# Patient Record
Sex: Female | Born: 1965 | Race: Black or African American | Hispanic: No | Smoking: Former smoker
Health system: Southern US, Community
[De-identification: ages and names within clinical notes are randomized; demographics above are authoritative.]

## PROBLEM LIST (undated history)

## (undated) DIAGNOSIS — F32A Depression, unspecified: Secondary | ICD-10-CM

## (undated) DIAGNOSIS — F329 Major depressive disorder, single episode, unspecified: Secondary | ICD-10-CM

## (undated) DIAGNOSIS — E785 Hyperlipidemia, unspecified: Secondary | ICD-10-CM

## (undated) DIAGNOSIS — E119 Type 2 diabetes mellitus without complications: Secondary | ICD-10-CM

## (undated) DIAGNOSIS — I10 Essential (primary) hypertension: Secondary | ICD-10-CM

## (undated) DIAGNOSIS — I639 Cerebral infarction, unspecified: Secondary | ICD-10-CM

## (undated) HISTORY — PX: TUBAL LIGATION: SHX77

## (undated) HISTORY — DX: Depression, unspecified: F32.A

## (undated) HISTORY — DX: Hyperlipidemia, unspecified: E78.5

## (undated) HISTORY — DX: Essential (primary) hypertension: I10

## (undated) HISTORY — DX: Type 2 diabetes mellitus without complications: E11.9

---
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
## 1998-06-20 ENCOUNTER — Encounter: Admission: RE | Admit: 1998-06-20 | Discharge: 1998-09-18 | Payer: Self-pay

## 1999-04-17 ENCOUNTER — Encounter: Admission: RE | Admit: 1999-04-17 | Discharge: 1999-04-17 | Payer: Self-pay | Admitting: Orthopedic Surgery

## 2000-02-21 ENCOUNTER — Other Ambulatory Visit: Admission: RE | Admit: 2000-02-21 | Discharge: 2000-02-21 | Payer: Self-pay | Admitting: Family Medicine

## 2001-07-08 ENCOUNTER — Encounter: Admission: RE | Admit: 2001-07-08 | Discharge: 2001-07-14 | Payer: Self-pay | Admitting: Family Medicine

## 2001-09-15 ENCOUNTER — Other Ambulatory Visit: Admission: RE | Admit: 2001-09-15 | Discharge: 2001-09-15 | Payer: Self-pay | Admitting: Family Medicine

## 2004-12-04 ENCOUNTER — Ambulatory Visit: Payer: Self-pay | Admitting: Family Medicine

## 2005-04-18 ENCOUNTER — Ambulatory Visit: Payer: Self-pay | Admitting: Family Medicine

## 2005-12-09 ENCOUNTER — Ambulatory Visit: Payer: Self-pay | Admitting: Family Medicine

## 2007-01-08 ENCOUNTER — Ambulatory Visit: Payer: Self-pay | Admitting: Family Medicine

## 2012-06-12 ENCOUNTER — Encounter (HOSPITAL_COMMUNITY): Payer: Self-pay | Admitting: *Deleted

## 2012-06-12 ENCOUNTER — Emergency Department (HOSPITAL_COMMUNITY): Payer: BC Managed Care – PPO

## 2012-06-12 ENCOUNTER — Emergency Department (HOSPITAL_COMMUNITY)
Admission: EM | Admit: 2012-06-12 | Discharge: 2012-06-12 | Disposition: A | Discharge status: Home / Self Care | Payer: BC Managed Care – PPO | Attending: Emergency Medicine | Admitting: Emergency Medicine

## 2012-06-12 DIAGNOSIS — F17200 Nicotine dependence, unspecified, uncomplicated: Secondary | ICD-10-CM | POA: Insufficient documentation

## 2012-06-12 DIAGNOSIS — M25519 Pain in unspecified shoulder: Secondary | ICD-10-CM | POA: Insufficient documentation

## 2012-06-12 DIAGNOSIS — R079 Chest pain, unspecified: Secondary | ICD-10-CM

## 2012-06-12 LAB — CBC WITH DIFFERENTIAL/PLATELET
Basophils Relative: 0 % (ref 0–1)
HCT: 36.6 % (ref 36.0–46.0)
Hemoglobin: 12.4 g/dL (ref 12.0–15.0)
MCHC: 33.9 g/dL (ref 30.0–36.0)
MCV: 88 fL (ref 78.0–100.0)
Monocytes Absolute: 0.4 10*3/uL (ref 0.1–1.0)
Monocytes Relative: 8 % (ref 3–12)
Neutro Abs: 2.1 10*3/uL (ref 1.7–7.7)

## 2012-06-12 LAB — BASIC METABOLIC PANEL
BUN: 13 mg/dL (ref 6–23)
CO2: 26 mEq/L (ref 19–32)
Chloride: 105 mEq/L (ref 96–112)
Creatinine, Ser: 0.7 mg/dL (ref 0.50–1.10)

## 2012-06-12 MED ORDER — NAPROXEN 500 MG PO TABS: 500.0000 mg | ORAL_TABLET | Freq: Two times a day (BID) | ORAL | Status: AC

## 2012-06-12 MED ORDER — HYDROCODONE-ACETAMINOPHEN 5-325 MG PO TABS: 1.0000 | ORAL_TABLET | Freq: Four times a day (QID) | ORAL | Status: AC | PRN

## 2012-06-12 MED ORDER — ASPIRIN 81 MG PO CHEW
324.0000 mg | CHEWABLE_TABLET | Freq: Once | ORAL | Status: AC
  Administered 2012-06-12: 324 mg via ORAL
  Filled 2012-06-12: qty 4

## 2012-06-12 NOTE — ED Provider Notes (Addendum)
History     CSN: [id number]  Arrival date & time 06/12/12  1655   First MD Initiated Contact with Patient 06/12/12 1722      Chief Complaint  Patient presents with  . Shoulder Pain    (Consider location/radiation/quality/duration/timing/severity/associated sxs/prior treatment) Patient is a 46 y.o. female presenting with chest pain.  Chest Pain The chest pain began 2 days ago. Duration of episode(s) is 2 minutes. Chest pain occurs intermittently. The chest pain is unchanged. At its most intense, the pain is at 7/10. The pain is currently at 0/10. The severity of the pain is moderate. The quality of the pain is described as aching and sharp. The pain radiates to the left shoulder. Chest pain is worsened by certain positions. Pertinent negatives for primary symptoms include no fever, no syncope, no shortness of breath, no cough, no wheezing, no palpitations, no abdominal pain, no nausea, no vomiting and no dizziness.  Pertinent negatives for past medical history include no CAD, no COPD, no CHF, no diabetes, no MI and no PE.     History reviewed. No pertinent past medical history.  Past Surgical History  Procedure Date  . Tubal ligation     History reviewed. No pertinent family history.  History  Substance Use Topics  . Smoking status: Current Everyday Smoker  . Smokeless tobacco: Not on file  . Alcohol Use: No    OB History    Grav Para Term Preterm Abortions TAB SAB Ect Mult Living                  Review of Systems  Constitutional: Negative for fever.  HENT: Negative for neck pain.   Eyes: Negative for visual disturbance.  Respiratory: Negative for cough, shortness of breath and wheezing.   Cardiovascular: Positive for chest pain. Negative for palpitations and syncope.  Gastrointestinal: Negative for nausea, vomiting and abdominal pain.  Genitourinary: Negative for dysuria.  Musculoskeletal: Negative for back pain.  Skin: Negative for rash.  Neurological: Negative  for dizziness and headaches.    Allergies  Penicillins  Home Medications   Current Outpatient Rx  Name Route Sig Dispense Refill  . HYDROCODONE-ACETAMINOPHEN 5-325 MG PO TABS Oral Take 1-2 tablets by mouth every 6 (six) hours as needed for pain. 10 tablet 0  . NAPROXEN 500 MG PO TABS Oral Take 1 tablet (500 mg total) by mouth 2 (two) times daily. 14 tablet 0    BP 151/75  Pulse 86  Temp 98.5 F (36.9 C) (Oral)  Resp 18  Ht 5\' 4"  (1.626 m)  Wt 185 lb (83.915 kg)  BMI 31.76 kg/m2  SpO2 98%  LMP 04/23/2012  Physical Exam  Nursing note and vitals reviewed. Constitutional: She is oriented to person, place, and time. She appears well-developed and well-nourished.  HENT:  Head: Normocephalic and atraumatic.  Mouth/Throat: Oropharynx is clear and moist.  Eyes: Conjunctivae and EOM are normal. Pupils are equal, round, and reactive to light.  Neck: Normal range of motion. Neck supple.  Cardiovascular: Normal rate, regular rhythm, normal heart sounds and intact distal pulses.   No murmur heard. Pulmonary/Chest: Effort normal and breath sounds normal. She has no wheezes. She has no rales. She exhibits no tenderness.  Abdominal: Soft. Bowel sounds are normal. There is no tenderness.  Musculoskeletal: Normal range of motion. She exhibits no edema and no tenderness.  Neurological: She is alert and oriented to person, place, and time. No cranial nerve deficit. She exhibits normal muscle tone. Coordination normal.  Skin: Skin is warm. No rash noted.    ED Course  Procedures (including critical care time)  Labs Reviewed  CBC WITH DIFFERENTIAL - Abnormal; Notable for the following:    Neutrophils Relative 41 (*)     Lymphocytes Relative 48 (*)     All other components within normal limits  BASIC METABOLIC PANEL - Abnormal; Notable for the following:    Glucose, Bld 117 (*)     All other components within normal limits  TROPONIN I   Dg Chest 2 View  06/12/2012  *RADIOLOGY REPORT*   Clinical Data: Upper posterior chest and shoulder pain.  CHEST - 2 VIEW  Comparison: None.  Findings: Cardiomediastinal silhouette unremarkable.   Lungs clear. Bronchovascular markings normal.  Pulmonary vascularity normal.  No pneumothorax.  No pleural effusions.  Mild degenerative changes involving the lower thoracic spine.  IMPRESSION: No acute cardiopulmonary disease.  Original Report Authenticated By: Arnell Sieving, M.D.    Date: 06/12/2012  Rate: 63  Rhythm: normal sinus rhythm  QRS Axis: normal  Intervals: normal  ST/T Wave abnormalities: normal  Conduction Disutrbances:none  Narrative Interpretation:   Old EKG Reviewed: none available  Results for orders placed during the hospital encounter of 06/12/12  CBC WITH DIFFERENTIAL      Component Value Range   WBC 5.0  4.0 - 10.5 K/uL   RBC 4.16  3.87 - 5.11 MIL/uL   Hemoglobin 12.4  12.0 - 15.0 g/dL   HCT 16.1  09.6 - 04.5 %   MCV 88.0  78.0 - 100.0 fL   MCH 29.8  26.0 - 34.0 pg   MCHC 33.9  30.0 - 36.0 g/dL   RDW 40.9  81.1 - 91.4 %   Platelets 295  150 - 400 K/uL   Neutrophils Relative 41 (*) 43 - 77 %   Neutro Abs 2.1  1.7 - 7.7 K/uL   Lymphocytes Relative 48 (*) 12 - 46 %   Lymphs Abs 2.4  0.7 - 4.0 K/uL   Monocytes Relative 8  3 - 12 %   Monocytes Absolute 0.4  0.1 - 1.0 K/uL   Eosinophils Relative 2  0 - 5 %   Eosinophils Absolute 0.1  0.0 - 0.7 K/uL   Basophils Relative 0  0 - 1 %   Basophils Absolute 0.0  0.0 - 0.1 K/uL  BASIC METABOLIC PANEL      Component Value Range   Sodium 140  135 - 145 mEq/L   Potassium 3.7  3.5 - 5.1 mEq/L   Chloride 105  96 - 112 mEq/L   CO2 26  19 - 32 mEq/L   Glucose, Bld 117 (*) 70 - 99 mg/dL   BUN 13  6 - 23 mg/dL   Creatinine, Ser 7.82  0.50 - 1.10 mg/dL   Calcium 9.1  8.4 - 95.6 mg/dL   GFR calc non Af Amer >90  >90 mL/min   GFR calc Af Amer >90  >90 mL/min  TROPONIN I      Component Value Range   Troponin I <0.30  <0.30 ng/mL     1. Chest pain       MDM  Cardiac  workup without any cystic findings EKG without acute changes troponin is negative patient's had chest pain intermittently for 2 days not consistent with acute cardiac event or unstable angina. Electrolytes without ascitic abnormalities no anemia no leukocytosis chest x-ray negative no pneumonia no pneumothorax. Pain is been intermittent also involves the left shoulder  is somewhat associated with movement but wasn't reproducible here in the emergency part may be musculoskeletal in nature could even be related to her left shoulder. Patient is followed by [facility] patient can followup with them in the next few days we'll send home with the anti-inflammatories and pain medicine.        Shelda Jakes, MD 06/12/12 2006  Shelda Jakes, MD 06/12/12 2011

## 2012-06-12 NOTE — ED Notes (Addendum)
Chest pain, starts at lt  shoulder and goes to lt ant upper chest. No nausea, no sob . Tender to palp lt upper ant chest, Pain occurs when lifting anything heavy with lt arm.

## 2013-11-03 ENCOUNTER — Emergency Department (HOSPITAL_COMMUNITY)
Admission: EM | Admit: 2013-11-03 | Discharge: 2013-11-03 | Disposition: A | Discharge status: Home / Self Care | Payer: BC Managed Care – PPO | Attending: Emergency Medicine | Admitting: Emergency Medicine

## 2013-11-03 ENCOUNTER — Encounter (HOSPITAL_COMMUNITY): Payer: Self-pay | Admitting: Emergency Medicine

## 2013-11-03 ENCOUNTER — Emergency Department (HOSPITAL_COMMUNITY): Payer: BC Managed Care – PPO

## 2013-11-03 DIAGNOSIS — R0789 Other chest pain: Secondary | ICD-10-CM

## 2013-11-03 DIAGNOSIS — Z880 Allergy status to penicillin: Secondary | ICD-10-CM | POA: Insufficient documentation

## 2013-11-03 DIAGNOSIS — R071 Chest pain on breathing: Secondary | ICD-10-CM | POA: Insufficient documentation

## 2013-11-03 DIAGNOSIS — M25519 Pain in unspecified shoulder: Secondary | ICD-10-CM | POA: Insufficient documentation

## 2013-11-03 DIAGNOSIS — Z791 Long term (current) use of non-steroidal anti-inflammatories (NSAID): Secondary | ICD-10-CM | POA: Insufficient documentation

## 2013-11-03 DIAGNOSIS — M25511 Pain in right shoulder: Secondary | ICD-10-CM

## 2013-11-03 DIAGNOSIS — F17200 Nicotine dependence, unspecified, uncomplicated: Secondary | ICD-10-CM | POA: Insufficient documentation

## 2013-11-03 LAB — CBC WITH DIFFERENTIAL/PLATELET
Eosinophils Absolute: 0.2 10*3/uL (ref 0.0–0.7)
Eosinophils Relative: 2 % (ref 0–5)
Hemoglobin: 13.3 g/dL (ref 12.0–15.0)
Lymphs Abs: 2.5 10*3/uL (ref 0.7–4.0)
MCH: 30.2 pg (ref 26.0–34.0)
MCV: 88 fL (ref 78.0–100.0)
Monocytes Relative: 5 % (ref 3–12)
RBC: 4.41 MIL/uL (ref 3.87–5.11)

## 2013-11-03 LAB — BASIC METABOLIC PANEL
BUN: 7 mg/dL (ref 6–23)
Calcium: 8.9 mg/dL (ref 8.4–10.5)
GFR calc Af Amer: >90 mL/min (ref >90)
Glucose, Bld: 119 mg/dL — ABNORMAL HIGH (ref 70–99)

## 2013-11-03 MED ORDER — METHOCARBAMOL 500 MG PO TABS: 500.0000 mg | ORAL_TABLET | Freq: Two times a day (BID) | ORAL | Status: DC | PRN

## 2013-11-03 MED ORDER — NAPROXEN 500 MG PO TABS: 500.0000 mg | ORAL_TABLET | Freq: Two times a day (BID) | ORAL | Status: DC

## 2013-11-03 MED ORDER — KETOROLAC TROMETHAMINE 60 MG/2ML IM SOLN: 60.0000 mg | Freq: Once | INTRAMUSCULAR | Status: DC

## 2013-11-03 MED ORDER — KETOROLAC TROMETHAMINE 30 MG/ML IJ SOLN
30.0000 mg | Freq: Once | INTRAMUSCULAR | Status: AC
  Administered 2013-11-03: 30 mg via INTRAVENOUS
  Filled 2013-11-03: qty 1

## 2013-11-03 NOTE — ED Notes (Signed)
MD at bedside. 

## 2013-11-03 NOTE — ED Provider Notes (Signed)
CSN: [id number]     Arrival date & time 11/03/13  0114 History   First MD Initiated Contact with Patient 11/03/13 0141     Chief Complaint  Patient presents with  . Chest Pain   (Consider location/radiation/quality/duration/timing/severity/associated sxs/prior Treatment) HPI Comments: 47 year old female who has a history of tobacco use, no other acute medical problems who takes no daily medications and presents with a complaint of right shoulder pain and chest pain. The patient states that the right shoulder pain started several days ago, it is in the posterior shoulder, is present most of the time and is worse with range of motion of the shoulder. She works lifting in a bakery setting and states that lifting and using her right arm causes increased pain in the shoulder. This evening while she was at work she noticed pain in her chest which is under the left breast, worse with leaning over, worse with picking things up. This pain is also constant since starting 2 hours ago. It is sometimes worse with deep breathing, it did not appear to be exertional or positional with regards to supine or prone positioning. She denies cough fever or swelling of the legs and has never had a blood clot.  Patient is a 47 y.o. female presenting with chest pain. The history is provided by the patient.  Chest Pain   History reviewed. No pertinent past medical history. Past Surgical History  Procedure Laterality Date  . Tubal ligation     History reviewed. No pertinent family history. History  Substance Use Topics  . Smoking status: Current Every Day Smoker  . Smokeless tobacco: Not on file  . Alcohol Use: Yes     Comment: rarely   OB History   Grav Para Term Preterm Abortions TAB SAB Ect Mult Living                 Review of Systems  Cardiovascular: Positive for chest pain.  All other systems reviewed and are negative.    Allergies  Penicillins  Home Medications   Current Outpatient Rx  Name   Route  Sig  Dispense  Refill  . methocarbamol (ROBAXIN) 500 MG tablet   Oral   Take 1 tablet (500 mg total) by mouth 2 (two) times daily as needed for muscle spasms.   20 tablet   0   . naproxen (NAPROSYN) 500 MG tablet   Oral   Take 1 tablet (500 mg total) by mouth 2 (two) times daily with a meal.   30 tablet   0    BP 174/71  Pulse 59  Temp(Src) 98.4 F (36.9 C) (Oral)  Resp 17  Ht 5\' 3"  (1.6 m)  Wt 180 lb (81.647 kg)  BMI 31.89 kg/m2  SpO2 99%  LMP 08/03/2013 Physical Exam  Nursing note and vitals reviewed. Constitutional: She appears well-developed and well-nourished. No distress.  HENT:  Head: Normocephalic and atraumatic.  Mouth/Throat: Oropharynx is clear and moist. No oropharyngeal exudate.  Eyes: Conjunctivae and EOM are normal. Pupils are equal, round, and reactive to light. Right eye exhibits no discharge. Left eye exhibits no discharge. No scleral icterus.  Neck: Normal range of motion. Neck supple. No JVD present. No thyromegaly present.  Cardiovascular: Normal rate, regular rhythm, normal heart sounds and intact distal pulses.  Exam reveals no gallop and no friction rub.   No murmur heard. Pulmonary/Chest: Effort normal and breath sounds normal. No respiratory distress. She has no wheezes. She has no rales. She exhibits tenderness (  Reproducible left-sided inframammary tenderness of the chest wall).  Abdominal: Soft. Bowel sounds are normal. She exhibits no distension and no mass. There is no tenderness.  Musculoskeletal: Normal range of motion. She exhibits tenderness ( Tender to palpation over the right posterior trapezius and shoulder). She exhibits no edema.  Lymphadenopathy:    She has no cervical adenopathy.  Neurological: She is alert. Coordination normal.  Skin: Skin is warm and dry. No rash noted. No erythema.  Psychiatric: She has a normal mood and affect. Her behavior is normal.    ED Course  Procedures (including critical care time) Labs  Review Labs Reviewed  BASIC METABOLIC PANEL - Abnormal; Notable for the following:    Glucose, Bld 119 (*)    All other components within normal limits  TROPONIN I  CBC WITH DIFFERENTIAL   Imaging Review Dg Chest 2 View  11/03/2013   CLINICAL DATA:  Right shoulder pain without injury for several days. Chest pain and shortness of breath for 1 hr tonight.  EXAM: CHEST  2 VIEW  COMPARISON:  06/12/2012  FINDINGS: The heart size and mediastinal contours are within normal limits. Both lungs are clear. The visualized skeletal structures are unremarkable.  IMPRESSION: No active cardiopulmonary disease.   Electronically Signed   By: Burman Nieves M.D.   On: 11/03/2013 02:28    EKG Interpretation    Date/Time:  Wednesday November 03 2013 01:21:18 EST Ventricular Rate:  64 PR Interval:  144 QRS Duration: 78 QT Interval:  446 QTC Calculation: 460 R Axis:   -13 Text Interpretation:  Normal sinus rhythm Minimal voltage criteria for LVH, may be normal variant Cannot rule out Anterior infarct (cited on or before 03-Nov-2013) Abnormal ECG When compared with ECG of 12-Jun-2012 17:28, No significant change was found Confirmed by Ercell Perlman  MD, Hollis Oh ([id number]) on 11/03/2013 1:52:44 AM            MDM   1. Chest wall pain   2. Shoulder pain, right    The patient has what appears to be body wall pain and shoulder pain consistent with musculoskeletal etiology. She does not have a history of heart disease, she does have hypertension on her vital signs but no fever, no tachycardia and an EKG which shows no acute ischemia. We'll obtain a troponin a chest x-ray the patient otherwise appears well, pain medication ordered.  Troponin normal, chest x-ray normal, patient stable for discharge  Meds given in ED:  Medications  ketorolac (TORADOL) 30 MG/ML injection 30 mg (30 mg Intravenous Given 11/03/13 0214)    New Prescriptions   METHOCARBAMOL (ROBAXIN) 500 MG TABLET    Take 1 tablet (500 mg total) by  mouth 2 (two) times daily as needed for muscle spasms.   NAPROXEN (NAPROSYN) 500 MG TABLET    Take 1 tablet (500 mg total) by mouth 2 (two) times daily with a meal.        Vida Roller, MD 11/03/13 [phone]

## 2013-11-03 NOTE — ED Notes (Signed)
Patient complaining of chest pain starting approximately 1 hour ago radiating into right shoulder.

## 2015-04-05 ENCOUNTER — Telehealth: Payer: Self-pay

## 2015-04-05 NOTE — Telephone Encounter (Signed)
[facility] I called and LM for a return call.

## 2015-04-07 ENCOUNTER — Telehealth: Payer: Self-pay

## 2015-04-07 NOTE — Telephone Encounter (Signed)
I called pt and triaged.

## 2015-04-07 NOTE — Telephone Encounter (Signed)
Patient was calling to speak with DS, but nurse was D/C a patient. I told her that nurse would call her back in a few minutes per nurse. Please return call at [phone]

## 2015-04-11 ENCOUNTER — Other Ambulatory Visit: Payer: Self-pay

## 2015-04-11 DIAGNOSIS — Z1211 Encounter for screening for malignant neoplasm of colon: Secondary | ICD-10-CM

## 2015-04-11 MED ORDER — SOD PICOSULFATE-MAG OX-CIT ACD 10-3.5-12 MG-GM-GM PO PACK
1.0000 | PACK | ORAL | Status: DC
Start: 2015-04-11 — End: 2015-05-26

## 2015-04-11 NOTE — Telephone Encounter (Signed)
PREPOPIK-DRINK WATER TO KEEP URINE LIGHT YELLOW.  FULL LIQUIDS WITH BREAKFAST oN day prior to TCS.   Full Liquid Diet A high-calorie, high-protein supplement should be used to meet your nutritional requirements when the full liquid diet is continued for more than 2 or 3 days. If this diet is to be used for an extended period of time (more than 7 days), a multivitamin should be considered.  Breads and Starches  Allowed: None are allowed except crackers WHOLE OR pureed (made into a thick, smooth soup) in soup.   Avoid: Any others.    Potatoes/Pasta/Rice  Allowed: ANY ITEM AS A SOUP OR SMALL PLATE OF MASHED POTATOES.       Vegetables  Allowed: Strained tomato or vegetable juice. Vegetables pureed in soup.   Avoid: Any others.    Fruit  Allowed: Any strained fruit juices and fruit drinks. Include 1 serving of citrus or vitamin C-enriched fruit juice daily.   Avoid: Any others.  Meat and Meat Substitutes  Allowed: Egg  Avoid: Any meat, fish, or fowl. All cheese.  Milk  Allowed: Milk beverages, including milk shakes and instant breakfast mixes. Smooth yogurt.   Avoid: Any others. Avoid dairy products if not tolerated.    Soups and Combination Foods  Allowed: Broth, strained cream soups. Strained, broth-based soups.   Avoid: Any others.    Desserts and Sweets  Allowed: flavored gelatin,plain ice cream, sherbet, smooth pudding, junket, fruit ices, frozen ice pops, pudding pops,, frozen fudge pops, chocolate syrup. Sugar, honey, jelly, syrup.   Avoid: Any others.  Fats and Oils  Allowed: Margarine, butter, cream, sour cream, oils.   Avoid: Any others.  Beverages  Allowed: All.   Avoid: None.  Condiments  Allowed: Iodized salt, pepper, spices, flavorings. Cocoa powder.   Avoid: Any others.    SAMPLE MEAL PLAN Breakfast   cup orange juice.   1 OR 2 EGGS   1 cup  milk.   1 cup beverage (coffee or tea).   Cream or sugar, if desired.     Midmorning Snack  2 SCRAMBLED OR HARD BOILED EGG   Lunch  1 cup cream soup.    cup fruit juice.   1 cup milk.    cup custard.   1 cup beverage (coffee or tea).   Cream or sugar, if desired.    Midafternoon Snack  1 cup milk shake.  Dinner  1 cup cream soup.    cup fruit juice.   1 cup milk.    cup pudding.   1 cup beverage (coffee or tea).   Cream or sugar, if desired.  Evening Snack  1 cup supplement.  To increase calories, add sugar, cream, butter, or margarine if possible. Nutritional supplements will also increase the total calories.

## 2015-04-11 NOTE — Telephone Encounter (Signed)
See separate triage.  

## 2015-04-11 NOTE — Telephone Encounter (Signed)
Gastroenterology Pre-Procedure Review  Request Date: 04/05/2015 Requesting Physician: Karsten Roochele Muse, PA  PATIENT REVIEW QUESTIONS: The patient responded to the following health history questions as indicated:    1. Diabetes Melitis: Borderline 2. Joint replacements in the past 12 months: no 3. Major health problems in the past 3 months: no 4. Has an artificial valve or MVP: no 5. Has a defibrillator: no 6. Has been advised in past to take antibiotics in advance of a procedure like teeth cleaning: no    MEDICATIONS & ALLERGIES:    Patient reports the following regarding taking any blood thinners:   Plavix? no Aspirin? no Coumadin? no  Patient confirms/reports the following medications:  Current Outpatient Prescriptions  Medication Sig Dispense Refill  . methocarbamol (ROBAXIN) 500 MG tablet Take 1 tablet (500 mg total) by mouth 2 (two) times daily as needed for muscle spasms. 20 tablet 0  . naproxen sodium (ANAPROX) 220 MG tablet Take 220 mg by mouth 2 (two) times daily with a meal. Only as needed    . naproxen (NAPROSYN) 500 MG tablet Take 1 tablet (500 mg total) by mouth 2 (two) times daily with a meal. (Patient not taking: Reported on 04/07/2015) 30 tablet 0   No current facility-administered medications for this visit.    Patient confirms/reports the following allergies:  Allergies  Allergen Reactions  . Penicillins Itching and Rash    No orders of the defined types were placed in this encounter.    AUTHORIZATION INFORMATION Primary Insurance:   ID #:   Group #:  Pre-Cert / Auth required:  Pre-Cert / Auth #:   Secondary Insurance:   ID #:  Group #:  Pre-Cert / Auth required:  Pre-Cert / Auth #:   SCHEDULE INFORMATION: Procedure has been scheduled as follows:  Date: 04/21/2015              Time:  11:00 AM Location:  [facility] Short Stay  This Gastroenterology Pre-Precedure Review Form is being routed to the following provider(s): Jonette EvaSandi Fields, MD

## 2015-04-11 NOTE — Telephone Encounter (Signed)
Rx sent to the pharmacy and instructions mailed to pt. ( Also, handwritten on instructions to drink lots of water with this prep).

## 2015-04-17 ENCOUNTER — Telehealth: Payer: Self-pay

## 2015-04-17 NOTE — Telephone Encounter (Signed)
I called [employer] @ [phone] and spoke to [place] A who said a PA is not required for a screening colonoscopy.

## 2015-04-17 NOTE — Telephone Encounter (Signed)
Reference number for this call is: [id number].

## 2015-04-20 ENCOUNTER — Telehealth: Payer: Self-pay

## 2015-04-20 NOTE — Telephone Encounter (Signed)
Endo nurse(Melanie)  called office and states that while doing phone calls for procedures that pt was concerned because her ride has to be at work at 1:00pm tomorrow. Shawna Orleans states that there is no guarantee that pt will be release by the time she needs for her ride.   Called pt and she doesn't want to risk her ride being late for work. Pt wants to be rescheduled at a later date and maybe an earlier time.   Pt will be past her 30 days and the triage nurse will have to reschedule her. Pt is aware she will have to buy another prep.  Routing to [employer]

## 2015-04-21 ENCOUNTER — Ambulatory Visit (HOSPITAL_COMMUNITY)
Admission: RE | Admit: 2015-04-21 | Payer: BLUE CROSS/BLUE SHIELD | Source: Ambulatory Visit | Admitting: Gastroenterology

## 2015-04-21 ENCOUNTER — Encounter (HOSPITAL_COMMUNITY): Admission: RE | Payer: Self-pay | Source: Ambulatory Visit

## 2015-04-21 SURGERY — COLONOSCOPY
Anesthesia: Moderate Sedation

## 2015-04-24 NOTE — Telephone Encounter (Signed)
LMOM for pt to call to reschedule colonoscopy. Letter mailed also.

## 2015-05-02 ENCOUNTER — Encounter (HOSPITAL_COMMUNITY): Payer: Self-pay | Admitting: *Deleted

## 2015-05-02 ENCOUNTER — Emergency Department (HOSPITAL_COMMUNITY)
Admission: EM | Admit: 2015-05-02 | Discharge: 2015-05-03 | Disposition: A | Discharge status: Home / Self Care | Payer: BLUE CROSS/BLUE SHIELD | Attending: Emergency Medicine | Admitting: Emergency Medicine

## 2015-05-02 DIAGNOSIS — Z791 Long term (current) use of non-steroidal anti-inflammatories (NSAID): Secondary | ICD-10-CM | POA: Diagnosis not present

## 2015-05-02 DIAGNOSIS — Z79899 Other long term (current) drug therapy: Secondary | ICD-10-CM | POA: Insufficient documentation

## 2015-05-02 DIAGNOSIS — Z720 Tobacco use: Secondary | ICD-10-CM | POA: Insufficient documentation

## 2015-05-02 DIAGNOSIS — R531 Weakness: Secondary | ICD-10-CM | POA: Insufficient documentation

## 2015-05-02 DIAGNOSIS — Z880 Allergy status to penicillin: Secondary | ICD-10-CM | POA: Insufficient documentation

## 2015-05-02 DIAGNOSIS — R51 Headache: Secondary | ICD-10-CM | POA: Insufficient documentation

## 2015-05-02 DIAGNOSIS — R5383 Other fatigue: Secondary | ICD-10-CM | POA: Diagnosis not present

## 2015-05-02 DIAGNOSIS — H53149 Visual discomfort, unspecified: Secondary | ICD-10-CM | POA: Diagnosis not present

## 2015-05-02 DIAGNOSIS — R519 Headache, unspecified: Secondary | ICD-10-CM

## 2015-05-02 MED ORDER — OXYCODONE-ACETAMINOPHEN 5-325 MG PO TABS
1.0000 | ORAL_TABLET | Freq: Once | ORAL | Status: AC
  Administered 2015-05-03: 1 via ORAL
  Filled 2015-05-02: qty 1

## 2015-05-02 NOTE — ED Notes (Signed)
Pt states she has not had any energy today. Pt also c/o headache and high blood pressure.

## 2015-05-02 NOTE — Telephone Encounter (Signed)
Patient would like for you to call her at [phone] to reschedule her appt.

## 2015-05-02 NOTE — ED Provider Notes (Signed)
CSN: [id number]     Arrival date & time 05/02/15  2159 History   This chart was scribed for Cindy RazorStephen Raevin Wierenga, MD by Merlene LaughterAdem Cosgel, ED Scribe. This patient was seen in room APA01/APA01 and the patient's care was started at 11:18 PM.    Chief Complaint  Patient presents with  . Headache    The history is provided by the patient. No language interpreter was used.    HPI Comments: Lawerance SabalFrances Spisak is a 49 y.o. female who presents to the Emergency Department complaining of intermittent, moderate, headache to the front and back, onset 2 days ago.  Patient complains of associated loss of energy this morning and mild photophobia. Patient states she also has trouble sleeping. Patient took Aleve for the headache with mild relief. Patient also feels her blood pressure is elevated, however, she denies history of HTN. Patient denies associated fever, chills, numbness, tingling, hematochezia, melena. Patient denies history of anemia.   History reviewed. No pertinent past medical history. Past Surgical History  Procedure Laterality Date  . Tubal ligation     History reviewed. No pertinent family history. History  Substance Use Topics  . Smoking status: Current Every Day Smoker  . Smokeless tobacco: Not on file  . Alcohol Use: Yes     Comment: rarely   OB History    No data available     Review of Systems  Constitutional: Negative for fever and chills.  Gastrointestinal: Negative for blood in stool.  Neurological: Positive for weakness and headaches. Negative for numbness.       Photophobia      Allergies  Penicillins  Home Medications   Prior to Admission medications   Medication Sig Start Date End Date Taking? Authorizing Provider  methocarbamol (ROBAXIN) 500 MG tablet Take 1 tablet (500 mg total) by mouth 2 (two) times daily as needed for muscle spasms. 11/03/13   Eber HongBrian Miller, MD  naproxen (NAPROSYN) 500 MG tablet Take 1 tablet (500 mg total) by mouth 2 (two) times daily with a  meal. Patient not taking: Reported on 04/07/2015 11/03/13   Eber HongBrian Miller, MD  naproxen sodium (ANAPROX) 220 MG tablet Take 220 mg by mouth 2 (two) times daily with a meal. Only as needed    Historical Provider, MD  Omega-3 Fatty Acids (FISH OIL) 1000 MG CAPS Take 1 capsule by mouth daily.    Historical Provider, MD  Sod Picosulfate-Mag Ox-Cit Acd 10-3.5-12 MG-GM-GM PACK Take 1 Container by mouth as directed. 04/11/15   West BaliSandi L Fields, MD   Triage Vitals: BP 169/84 mmHg  Pulse 74  Temp(Src) 98.2 F (36.8 C) (Oral)  Resp 14  Ht 5\' 3"  (1.6 m)  Wt 189 lb (85.73 kg)  BMI 33.49 kg/m2  SpO2 98%  LMP 08/03/2013 Physical Exam  Constitutional: She is oriented to person, place, and time. She appears well-developed and well-nourished. No distress.     HENT:  Head: Normocephalic and atraumatic.  Eyes: Conjunctivae and EOM are normal.  Neck: Neck supple. No tracheal deviation present.  No nuchal rigidity  Cardiovascular: Normal rate.   Pulmonary/Chest: Effort normal. No respiratory distress.  Musculoskeletal: Normal range of motion.  Neurological: She is alert and oriented to person, place, and time. No cranial nerve deficit. She exhibits normal muscle tone. Coordination normal.  Skin: Skin is warm and dry.  Psychiatric: She has a normal mood and affect. Her behavior is normal.  Nursing note and vitals reviewed.   ED Course  Procedures  DIAGNOSTIC STUDIES: Oxygen Saturation  is 98% on room air, normal by my interpretation.    COORDINATION OF CARE: 11:24 PM- Discussed plan for diagnostic blood work. Pt advised of plan for treatment and pt agrees.   Labs Review Labs Reviewed - No data to display  Imaging Review No results found.   EKG Interpretation None      MDM   Final diagnoses:  Nonintractable headache, unspecified chronicity pattern, unspecified headache type  Other fatigue   49yF with headache. Suspect primary HA. Consider emergent secondary causes such as bleed,  infectious or mass but doubt. There is no history of trauma. Pt has a nonfocal neurological exam. Afebrile and neck supple. No use of blood thinning medication. Consider ocular etiology such as acute angle closure glaucoma but doubt. Pt denies acute change in visual acuity and eye exam unremarkable. Doubt temporal arteritis given age, no temporal tenderness and temporal artery pulsations palpable. Doubt CO poisoning. No contacts with similar symptoms. Doubt venous thrombosis. Doubt carotid or vertebral arteries dissection. Symptoms improved with meds. Feel that can be safely discharged, but strict return precautions discussed. Outpt fu.  I personally preformed the services scribed in my presence. The recorded information has been reviewed is accurate. Cindy Razor, MD.    Cindy Razor, MD 05/03/15 [id number]

## 2015-05-03 LAB — I-STAT CHEM 8, ED
BUN: 14 mg/dL (ref 6–20)
Calcium, Ion: 1.22 mmol/L (ref 1.12–1.23)
Chloride: 106 mmol/L (ref 101–111)
Creatinine, Ser: 0.7 mg/dL (ref 0.44–1.00)
Glucose, Bld: 127 mg/dL — ABNORMAL HIGH (ref 65–99)
HCT: 40 % (ref 36.0–46.0)
Hemoglobin: 13.6 g/dL (ref 12.0–15.0)
Potassium: 4.2 mmol/L (ref 3.5–5.1)
Sodium: 140 mmol/L (ref 135–145)
TCO2: 24 mmol/L (ref 0–100)

## 2015-05-03 NOTE — Discharge Instructions (Signed)
Fatigue °Fatigue is a feeling of tiredness, lack of energy, lack of motivation, or feeling tired all the time. Having enough rest, good nutrition, and reducing stress will normally reduce fatigue. Consult your caregiver if it persists. The nature of your fatigue will help your caregiver to find out its cause. The treatment is based on the cause.  °CAUSES  °There are many causes for fatigue. Most of the time, fatigue can be traced to one or more of your habits or routines. Most causes fit into one or more of three general areas. They are: °Lifestyle problems °· Sleep disturbances. °· Overwork. °· Physical exertion. °· Unhealthy habits. °· Poor eating habits or eating disorders. °· Alcohol and/or drug use . °· Lack of proper nutrition (malnutrition). °Psychological problems °· Stress and/or anxiety problems. °· Depression. °· Grief. °· Boredom. °Medical Problems or Conditions °· Anemia. °· Pregnancy. °· Thyroid gland problems. °· Recovery from major surgery. °· Continuous pain. °· Emphysema or asthma that is not well controlled °· Allergic conditions. °· Diabetes. °· Infections (such as mononucleosis). °· Obesity. °· Sleep disorders, such as sleep apnea. °· Heart failure or other heart-related problems. °· Cancer. °· Kidney disease. °· Liver disease. °· Effects of certain medicines such as antihistamines, cough and cold remedies, prescription pain medicines, heart and blood pressure medicines, drugs used for treatment of cancer, and some antidepressants. °SYMPTOMS  °The symptoms of fatigue include:  °· Lack of energy. °· Lack of drive (motivation). °· Drowsiness. °· Feeling of indifference to the surroundings. °DIAGNOSIS  °The details of how you feel help guide your caregiver in finding out what is causing the fatigue. You will be asked about your present and past health condition. It is important to review all medicines that you take, including prescription and non-prescription items. A thorough exam will be done.  You will be questioned about your feelings, habits, and normal lifestyle. Your caregiver may suggest blood tests, urine tests, or other tests to look for common medical causes of fatigue.  °TREATMENT  °Fatigue is treated by correcting the underlying cause. For example, if you have continuous pain or depression, treating these causes will improve how you feel. Similarly, adjusting the dose of certain medicines will help in reducing fatigue.  °HOME CARE INSTRUCTIONS  °· Try to get the required amount of good sleep every night. °· Eat a healthy and nutritious diet, and drink enough water throughout the day. °· Practice ways of relaxing (including yoga or meditation). °· Exercise regularly. °· Make plans to change situations that cause stress. Act on those plans so that stresses decrease over time. Keep your work and personal routine reasonable. °· Avoid street drugs and minimize use of alcohol. °· Start taking a daily multivitamin after consulting your caregiver. °SEEK MEDICAL CARE IF:  °· You have persistent tiredness, which cannot be accounted for. °· You have fever. °· You have unintentional weight loss. °· You have headaches. °· You have disturbed sleep throughout the night. °· You are feeling sad. °· You have constipation. °· You have dry skin. °· You have gained weight. °· You are taking any new or different medicines that you suspect are causing fatigue. °· You are unable to sleep at night. °· You develop any unusual swelling of your legs or other parts of your body. °SEEK IMMEDIATE MEDICAL CARE IF:  °· You are feeling confused. °· Your vision is blurred. °· You feel faint or pass out. °· You develop severe headache. °· You develop severe abdominal, pelvic, or   back pain.  You develop chest pain, shortness of breath, or an irregular or fast heartbeat.  You are unable to pass a normal amount of urine.  You develop abnormal bleeding such as bleeding from the rectum or you vomit blood.  You have thoughts  about harming yourself or committing suicide.  You are worried that you might harm someone else. MAKE SURE YOU:   Understand these instructions.  Will watch your condition.  Will get help right away if you are not doing well or get worse. Document Released: 08/18/2007 Document Revised: 01/13/2012 Document Reviewed: 02/22/2014 [facility] Patient Information 2015 [place], [place]. This information is not intended to replace advice given to you by your health care provider. Make sure you discuss any questions you have with your health care provider.  Headaches, Frequently Asked Questions MIGRAINE HEADACHES Q: What is migraine? What causes it? How can I treat it? A: Generally, migraine headaches begin as a dull ache. Then they develop into a constant, throbbing, and pulsating pain. You may experience pain at the temples. You may experience pain at the front or back of one or both sides of the head. The pain is usually accompanied by a combination of:  Nausea.  Vomiting.  Sensitivity to light and noise. Some people (about 15%) experience an aura (see below) before an attack. The cause of migraine is believed to be chemical reactions in the brain. Treatment for migraine may include over-the-counter or prescription medications. It may also include self-help techniques. These include relaxation training and biofeedback.  Q: What is an aura? A: About 15% of people with migraine get an "aura". This is a sign of neurological symptoms that occur before a migraine headache. You may see wavy or jagged lines, dots, or flashing lights. You might experience tunnel vision or blind spots in one or both eyes. The aura can include visual or auditory hallucinations (something imagined). It may include disruptions in smell (such as strange odors), taste or touch. Other symptoms include:  Numbness.  A "pins and needles" sensation.  Difficulty in recalling or speaking the correct word. These neurological events  may last as long as 60 minutes. These symptoms will fade as the headache begins. Q: What is a trigger? A: Certain physical or environmental factors can lead to or "trigger" a migraine. These include:  Foods.  Hormonal changes.  Weather.  Stress. It is important to remember that triggers are different for everyone. To help prevent migraine attacks, you need to figure out which triggers affect you. Keep a headache diary. This is a good way to track triggers. The diary will help you talk to your healthcare professional about your condition. Q: Does weather affect migraines? A: Bright sunshine, hot, humid conditions, and drastic changes in barometric pressure may lead to, or "trigger," a migraine attack in some people. But studies have shown that weather does not act as a trigger for everyone with migraines. Q: What is the link between migraine and hormones? A: Hormones start and regulate many of your body's functions. Hormones keep your body in balance within a constantly changing environment. The levels of hormones in your body are unbalanced at times. Examples are during menstruation, pregnancy, or menopause. That can lead to a migraine attack. In fact, about three quarters of all women with migraine report that their attacks are related to the menstrual cycle.  Q: Is there an increased risk of stroke for migraine sufferers? A: The likelihood of a migraine attack causing a stroke is very remote.  That is not to say that migraine sufferers cannot have a stroke associated with their migraines. In persons under age 50, the most common associated factor for stroke is migraine headache. But over the course of a person's normal life span, the occurrence of migraine headache may actually be associated with a reduced risk of dying from cerebrovascular disease due to stroke.  Q: What are acute medications for migraine? A: Acute medications are used to treat the pain of the headache after it has started.  Examples over-the-counter medications, NSAIDs, ergots, and triptans.  Q: What are the triptans? A: Triptans are the newest class of abortive medications. They are specifically targeted to treat migraine. Triptans are vasoconstrictors. They moderate some chemical reactions in the brain. The triptans work on receptors in your brain. Triptans help to restore the balance of a neurotransmitter called serotonin. Fluctuations in levels of serotonin are thought to be a main cause of migraine.  Q: Are over-the-counter medications for migraine effective? A: Over-the-counter, or "OTC," medications may be effective in relieving mild to moderate pain and associated symptoms of migraine. But you should see your caregiver before beginning any treatment regimen for migraine.  Q: What are preventive medications for migraine? A: Preventive medications for migraine are sometimes referred to as "prophylactic" treatments. They are used to reduce the frequency, severity, and length of migraine attacks. Examples of preventive medications include antiepileptic medications, antidepressants, beta-blockers, calcium channel blockers, and NSAIDs (nonsteroidal anti-inflammatory drugs). Q: Why are anticonvulsants used to treat migraine? A: During the past few years, there has been an increased interest in antiepileptic drugs for the prevention of migraine. They are sometimes referred to as "anticonvulsants". Both epilepsy and migraine may be caused by similar reactions in the brain.  Q: Why are antidepressants used to treat migraine? A: Antidepressants are typically used to treat people with depression. They may reduce migraine frequency by regulating chemical levels, such as serotonin, in the brain.  Q: What alternative therapies are used to treat migraine? A: The term "alternative therapies" is often used to describe treatments considered outside the scope of conventional Western medicine. Examples of alternative therapy include  acupuncture, acupressure, and yoga. Another common alternative treatment is herbal therapy. Some herbs are believed to relieve headache pain. Always discuss alternative therapies with your caregiver before proceeding. Some herbal products contain arsenic and other toxins. TENSION HEADACHES Q: What is a tension-type headache? What causes it? How can I treat it? A: Tension-type headaches occur randomly. They are often the result of temporary stress, anxiety, fatigue, or anger. Symptoms include soreness in your temples, a tightening band-like sensation around your head (a "vice-like" ache). Symptoms can also include a pulling feeling, pressure sensations, and contracting head and neck muscles. The headache begins in your forehead, temples, or the back of your head and neck. Treatment for tension-type headache may include over-the-counter or prescription medications. Treatment may also include self-help techniques such as relaxation training and biofeedback. CLUSTER HEADACHES Q: What is a cluster headache? What causes it? How can I treat it? A: Cluster headache gets its name because the attacks come in groups. The pain arrives with little, if any, warning. It is usually on one side of the head. A tearing or bloodshot eye and a runny nose on the same side of the headache may also accompany the pain. Cluster headaches are believed to be caused by chemical reactions in the brain. They have been described as the most severe and intense of any headache type. Treatment for cluster  headache includes prescription medication and oxygen. SINUS HEADACHES Q: What is a sinus headache? What causes it? How can I treat it? A: When a cavity in the bones of the face and skull (a sinus) becomes inflamed, the inflammation will cause localized pain. This condition is usually the result of an allergic reaction, a tumor, or an infection. If your headache is caused by a sinus blockage, such as an infection, you will probably have a  fever. An x-ray will confirm a sinus blockage. Your caregiver's treatment might include antibiotics for the infection, as well as antihistamines or decongestants.  REBOUND HEADACHES Q: What is a rebound headache? What causes it? How can I treat it? A: A pattern of taking acute headache medications too often can lead to a condition known as "rebound headache." A pattern of taking too much headache medication includes taking it more than 2 days per week or in excessive amounts. That means more than the label or a caregiver advises. With rebound headaches, your medications not only stop relieving pain, they actually begin to cause headaches. Doctors treat rebound headache by tapering the medication that is being overused. Sometimes your caregiver will gradually substitute a different type of treatment or medication. Stopping may be a challenge. Regularly overusing a medication increases the potential for serious side effects. Consult a caregiver if you regularly use headache medications more than 2 days per week or more than the label advises. ADDITIONAL QUESTIONS AND ANSWERS Q: What is biofeedback? A: Biofeedback is a self-help treatment. Biofeedback uses special equipment to monitor your body's involuntary physical responses. Biofeedback monitors:  Breathing.  Pulse.  Heart rate.  Temperature.  Muscle tension.  Brain activity. Biofeedback helps you refine and perfect your relaxation exercises. You learn to control the physical responses that are related to stress. Once the technique has been mastered, you do not need the equipment any more. Q: Are headaches hereditary? A: Four out of five (80%) of people that suffer report a family history of migraine. Scientists are not sure if this is genetic or a family predisposition. Despite the uncertainty, a child has a 50% chance of having migraine if one parent suffers. The child has a 75% chance if both parents suffer.  Q: Can children get  headaches? A: By the time they reach high school, most young people have experienced some type of headache. Many safe and effective approaches or medications can prevent a headache from occurring or stop it after it has begun.  Q: What type of doctor should I see to diagnose and treat my headache? A: Start with your primary caregiver. Discuss his or her experience and approach to headaches. Discuss methods of classification, diagnosis, and treatment. Your caregiver may decide to recommend you to a headache specialist, depending upon your symptoms or other physical conditions. Having diabetes, allergies, etc., may require a more comprehensive and inclusive approach to your headache. The National Headache Foundation will provide, upon request, a list of [facility] physician members in your state. Document Released: 01/11/2004 Document Revised: 01/13/2012 Document Reviewed: 06/20/2008 [facility] Patient Information 2015 [place], [place]. This information is not intended to replace advice given to you by your health care provider. Make sure you discuss any questions you have with your health care provider.

## 2015-05-03 NOTE — Telephone Encounter (Signed)
LMOM for pt to call to reschedule.

## 2015-05-04 NOTE — Telephone Encounter (Signed)
Pt has been rescheduled to 05/26/2015 at 2:15 PM for the colonoscopy.

## 2015-05-04 NOTE — Telephone Encounter (Signed)
LMOM to call to reschedule colonoscopy.

## 2015-05-10 ENCOUNTER — Other Ambulatory Visit: Payer: Self-pay

## 2015-05-10 DIAGNOSIS — Z1211 Encounter for screening for malignant neoplasm of colon: Secondary | ICD-10-CM

## 2015-05-10 NOTE — Telephone Encounter (Signed)
New instructions mailed to pt with note to call so I can update her meds. Her phone was not accepting calls today.

## 2015-05-17 ENCOUNTER — Telehealth: Payer: Self-pay

## 2015-05-17 NOTE — Telephone Encounter (Signed)
I tried to call pt to update triage. Not accepting calls. Letter mailed for her to call and let us know if there has been any change in her meds since she was triaged.

## 2015-05-24 ENCOUNTER — Telehealth: Payer: Self-pay

## 2015-05-24 MED ORDER — PEG 3350-KCL-NA BICARB-NACL 420 G PO SOLR: 4000.0000 mL | ORAL | Status: DC

## 2015-05-24 NOTE — Telephone Encounter (Signed)
Pt left Vm that there has been no change in her meds since she was triaged.

## 2015-05-24 NOTE — Telephone Encounter (Signed)
REVIEWED-NO ADDITIONAL RECOMMENDATIONS. 

## 2015-05-26 ENCOUNTER — Encounter (HOSPITAL_COMMUNITY)
Admission: RE | Disposition: A | Discharge status: Home / Self Care | Payer: Self-pay | Source: Ambulatory Visit | Attending: Gastroenterology

## 2015-05-26 ENCOUNTER — Ambulatory Visit (HOSPITAL_COMMUNITY)
Admission: RE | Admit: 2015-05-26 | Discharge: 2015-05-26 | Disposition: A | Discharge status: Home / Self Care | Payer: BLUE CROSS/BLUE SHIELD | Source: Ambulatory Visit | Attending: Gastroenterology | Admitting: Gastroenterology

## 2015-05-26 DIAGNOSIS — K648 Other hemorrhoids: Secondary | ICD-10-CM | POA: Diagnosis not present

## 2015-05-26 DIAGNOSIS — Z79899 Other long term (current) drug therapy: Secondary | ICD-10-CM | POA: Diagnosis not present

## 2015-05-26 DIAGNOSIS — K644 Residual hemorrhoidal skin tags: Secondary | ICD-10-CM | POA: Insufficient documentation

## 2015-05-26 DIAGNOSIS — Z1211 Encounter for screening for malignant neoplasm of colon: Secondary | ICD-10-CM | POA: Diagnosis present

## 2015-05-26 DIAGNOSIS — M6289 Other specified disorders of muscle: Secondary | ICD-10-CM | POA: Insufficient documentation

## 2015-05-26 DIAGNOSIS — K5730 Diverticulosis of large intestine without perforation or abscess without bleeding: Secondary | ICD-10-CM | POA: Insufficient documentation

## 2015-05-26 DIAGNOSIS — F17200 Nicotine dependence, unspecified, uncomplicated: Secondary | ICD-10-CM | POA: Insufficient documentation

## 2015-05-26 DIAGNOSIS — K621 Rectal polyp: Secondary | ICD-10-CM | POA: Insufficient documentation

## 2015-05-26 DIAGNOSIS — K6389 Other specified diseases of intestine: Secondary | ICD-10-CM | POA: Insufficient documentation

## 2015-05-26 HISTORY — PX: COLONOSCOPY: SHX5424

## 2015-05-26 SURGERY — COLONOSCOPY
Anesthesia: Moderate Sedation

## 2015-05-26 MED ORDER — MEPERIDINE HCL 100 MG/ML IJ SOLN
INTRAMUSCULAR | Status: DC | PRN
  Administered 2015-05-26 (×2): 25 mg via INTRAVENOUS

## 2015-05-26 MED ORDER — MIDAZOLAM HCL 5 MG/5ML IJ SOLN
INTRAMUSCULAR | Status: DC | PRN
  Administered 2015-05-26: 1 mg via INTRAVENOUS
  Administered 2015-05-26 (×2): 2 mg via INTRAVENOUS

## 2015-05-26 MED ORDER — SODIUM CHLORIDE 0.9 % IV SOLN
INTRAVENOUS | Status: DC
  Administered 2015-05-26: 13:00:00 via INTRAVENOUS

## 2015-05-26 MED ORDER — MIDAZOLAM HCL 5 MG/5ML IJ SOLN
INTRAMUSCULAR | Status: AC
  Filled 2015-05-26: qty 10

## 2015-05-26 MED ORDER — MEPERIDINE HCL 100 MG/ML IJ SOLN
INTRAMUSCULAR | Status: AC
  Filled 2015-05-26: qty 2

## 2015-05-26 MED ORDER — SODIUM CHLORIDE 0.9 % IV SOLN: INTRAVENOUS | Status: DC

## 2015-05-26 NOTE — H&P (Addendum)
  Primary Care Physician:  Josue Hector, MD Primary Gastroenterologist:  Dr. Darrick Penna  Pre-Procedure History & Physical: HPI:  Cindy Garrett is a 49 y.o. female here for COLON CANCER SCREENING.  No past medical history on file.  Past Surgical History  Procedure Laterality Date  . Tubal ligation      Prior to Admission medications   Medication Sig Start Date End Date Taking? Authorizing Provider  methocarbamol (ROBAXIN) 500 MG tablet Take 1 tablet (500 mg total) by mouth 2 (two) times daily as needed for muscle spasms. 11/03/13   Eber Hong, MD  naproxen (NAPROSYN) 500 MG tablet Take 1 tablet (500 mg total) by mouth 2 (two) times daily with a meal. Patient not taking: Reported on 04/07/2015 11/03/13   Eber Hong, MD  naproxen sodium (ANAPROX) 220 MG tablet Take 220 mg by mouth 2 (two) times daily with a meal. Only as needed    Historical Provider, MD  Omega-3 Fatty Acids (FISH OIL) 1000 MG CAPS Take 1 capsule by mouth daily.    Historical Provider, MD  polyethylene glycol-electrolytes (TRILYTE) 420 G solution Take 4,000 mLs by mouth as directed. 05/24/15   West Bali, MD  Sod Picosulfate-Mag Ox-Cit Acd 10-3.5-12 MG-GM-GM PACK Take 1 Container by mouth as directed. 04/11/15   West Bali, MD    Allergies as of 05/10/2015 - Review Complete 05/02/2015  Allergen Reaction Noted  . Penicillins Itching and Rash 06/12/2012    No family history on file.  History   Social History  . Marital Status: Married    Spouse Name: N/A  . Number of Children: N/A  . Years of Education: N/A   Occupational History  . Not on file.   Social History Main Topics  . Smoking status: Current Every Day Smoker  . Smokeless tobacco: Not on file  . Alcohol Use: Yes     Comment: rarely  . Drug Use: No  . Sexual Activity: Yes    Birth Control/ Protection: Surgical   Other Topics Concern  . Not on file   Social History Narrative    Review of Systems: See HPI, otherwise negative  ROS   Physical Exam: LMP 08/03/2013 General:   Alert,  pleasant and cooperative in NAD Head:  Normocephalic and atraumatic. Neck:  Supple; Lungs:  Clear throughout to auscultation.    Heart:  Regular rate and rhythm. Abdomen:  Soft, nontender and nondistended. Normal bowel sounds, without guarding, and without rebound.   Neurologic:  Alert and  oriented x4;  grossly normal neurologically.  Impression/Plan:     SCREENING  Plan:  1. TCS TODAY

## 2015-05-26 NOTE — Op Note (Signed)
[facility] [address] [place], [address]   COLONOSCOPY PROCEDURE REPORT  PATIENT: Cindy, Garrett  MR#: [id number] BIRTHDATE: [birth year] , 49  yrs. old GENDER: female ENDOSCOPIST: West Bali, MD REFERRED AO:[id number] Lysbeth Galas, M.D. PROCEDURE DATE:  06-03-2015 PROCEDURE:   Colonoscopy with cold biopsy polypectomy INDICATIONS:average risk patient for colon cancer. MEDICATIONS: Versed 5 mg IV and Demerol 50 mg IV  DESCRIPTION OF PROCEDURE:    Physical exam was performed.  Informed consent was obtained from the patient after explaining the benefits, risks, and alternatives to procedure.  The patient was connected to monitor and placed in left lateral position. Continuous oxygen was provided by nasal cannula and IV medicine administered through an indwelling cannula.  After administration of sedation and rectal exam, the patients rectum was intubated and the EC-[id number] ([id number])  colonoscope was advanced under direct visualization to the cecum.  The scope was removed slowly by carefully examining the color, texture, anatomy, and integrity mucosa on the way out.  The patient was recovered in endoscopy and discharged home in satisfactory condition. Estimated blood loss is zero unless otherwise noted in this procedure report.     COLON FINDINGS: A sessile polyp ranging from 3 to 5mm in size was found in the rectum.  A polypectomy was performed with cold forceps.  , There was mild diverticulosis noted in the sigmoid colon with associated muscular hypertrophy and tortuosity. MANUAL PRESSURE APPLIED TO REACH CECUM. Moderate sized external and internal hemorrhoids were found.  PREP QUALITY: good. CECAL W/D TIME: 14       minutes  COMPLICATIONS: None  ENDOSCOPIC IMPRESSION: 1.   ONE POLYPOID RECTAL LESION REOVED 2.   Mild diverticulosis in the sigmoid colon 3.   Moderate sized external and internal hemorrhoids  RECOMMENDATIONS: AWAIT BIOPSY HIGH FIBER  DIET NEXT TCS IN 5-10 YEARS.  CONSIDER OVERTUBE.    _______________________________ eSignedWest Bali, MD 03-Jun-2015 2:59 PM    CPT CODES: ICD CODES:  The ICD and CPT codes recommended by this software are interpretations from the data that the clinical staff has captured with the software.  The verification of the translation of this report to the ICD and CPT codes and modifiers is the sole responsibility of the health care institution and practicing physician where this report was generated.  [employer] will not be held responsible for the validity of the ICD and CPT codes included on this report.  AMA assumes no liability for data contained or not contained herein. CPT is a Publishing rights manager of the [employer].

## 2015-05-26 NOTE — Discharge Instructions (Signed)
You had 1 small polyp removed. You have MODERATE EXTERNAL AND internal hemorrhoids and diverticulosis IN YOUR LEFT COLON.   FOLLOW A HIGH FIBER DIET. AVOID ITEMS THAT CAUSE BLOATING. SEE INFO BELOW.  YOUR BIOPSY RESULTS WILL BE AVAILABLE IN MY CHART AFTER JUL 26 AND MY OFFICE WILL CONTACT YOU IN 10-14 DAYS WITH YOUR RESULTS.   Next colonoscopy in 5-10 years.   Colonoscopy Care After Read the instructions outlined below and refer to this sheet in the next week. These discharge instructions provide you with general information on caring for yourself after you leave the hospital. While your treatment has been planned according to the most current medical practices available, unavoidable complications occasionally occur. If you have any problems or questions after discharge, call DR. Calynn Ferrero, [phone].  ACTIVITY  You may resume your regular activity, but move at a slower pace for the next 24 hours.   Take frequent rest periods for the next 24 hours.   Walking will help get rid of the air and reduce the bloated feeling in your belly (abdomen).   No driving for 24 hours (because of the medicine (anesthesia) used during the test).   You may shower.   Do not sign any important legal documents or operate any machinery for 24 hours (because of the anesthesia used during the test).    NUTRITION  Drink plenty of fluids.   You may resume your normal diet as instructed by your doctor.   Begin with a light meal and progress to your normal diet. Heavy or fried foods are harder to digest and may make you feel sick to your stomach (nauseated).   Avoid alcoholic beverages for 24 hours or as instructed.    MEDICATIONS  You may resume your normal medications.   WHAT YOU CAN EXPECT TODAY  Some feelings of bloating in the abdomen.   Passage of more gas than usual.   Spotting of blood in your stool or on the toilet paper  .  IF YOU HAD POLYPS REMOVED DURING THE COLONOSCOPY:  Eat a  soft diet IF YOU HAVE NAUSEA, BLOATING, ABDOMINAL PAIN, OR VOMITING.    FINDING OUT THE RESULTS OF YOUR TEST Not all test results are available during your visit. DR. Darrick Penna WILL CALL YOU WITHIN 14 DAYS OF YOUR PROCEDUE WITH YOUR RESULTS. Do not assume everything is normal if you have not heard from DR. Nakira Litzau, CALL HER OFFICE AT [phone].  SEEK IMMEDIATE MEDICAL ATTENTION AND CALL THE OFFICE: [phone] IF:  You have more than a spotting of blood in your stool.   Your belly is swollen (abdominal distention).   You are nauseated or vomiting.   You have a temperature over 101F.   You have abdominal pain or discomfort that is severe or gets worse throughout the day.  Polyps, Colon  A polyp is extra tissue that grows inside your body. Colon polyps grow in the large intestine. The large intestine, also called the colon, is part of your digestive system. It is a long, hollow tube at the end of your digestive tract where your body makes and stores stool. Most polyps are not dangerous. They are benign. This means they are not cancerous. But over time, some types of polyps can turn into cancer. Polyps that are smaller than a pea are usually not harmful. But larger polyps could someday become or may already be cancerous. To be safe, doctors remove all polyps and test them.   PREVENTION There is not one sure way  to prevent polyps. You might be able to lower your risk of getting them if you:  Eat more fruits and vegetables and less fatty food.   Do not smoke.   Avoid alcohol.   Exercise every day.   Lose weight if you are overweight.   Eating more calcium and folate can also lower your risk of getting polyps. Some foods that are rich in calcium are milk, cheese, and broccoli. Some foods that are rich in folate are chickpeas, kidney beans, and spinach.   High-Fiber Diet A high-fiber diet changes your normal diet to include more whole grains, legumes, fruits, and vegetables. Changes in  the diet involve replacing refined carbohydrates with unrefined foods. The calorie level of the diet is essentially unchanged. The Dietary Reference Intake (recommended amount) for adult males is 38 grams per day. For adult females, it is 25 grams per day. Pregnant and lactating women should consume 28 grams of fiber per day. Fiber is the intact part of a plant that is not broken down during digestion. Functional fiber is fiber that has been isolated from the plant to provide a beneficial effect in the body. PURPOSE  Increase stool bulk.   Ease and regulate bowel movements.   Lower cholesterol.  REDUCE RISK OF COLON CANCER  INDICATIONS THAT YOU NEED MORE FIBER  Constipation and hemorrhoids.   Uncomplicated diverticulosis (intestine condition) and irritable bowel syndrome.   Weight management.   As a protective measure against hardening of the arteries (atherosclerosis), diabetes, and cancer.   GUIDELINES FOR INCREASING FIBER IN THE DIET  Start adding fiber to the diet slowly. A gradual increase of about 5 more grams (2 slices of whole-wheat bread, 2 servings of most fruits or vegetables, or 1 bowl of high-fiber cereal) per day is best. Too rapid an increase in fiber may result in constipation, flatulence, and bloating.   Drink enough water and fluids to keep your urine clear or pale yellow. Water, juice, or caffeine-free drinks are recommended. Not drinking enough fluid may cause constipation.   Eat a variety of high-fiber foods rather than one type of fiber.   Try to increase your intake of fiber through using high-fiber foods rather than fiber pills or supplements that contain small amounts of fiber.   The goal is to change the types of food eaten. Do not supplement your present diet with high-fiber foods, but replace foods in your present diet.   INCLUDE A VARIETY OF FIBER SOURCES  Replace refined and processed grains with whole grains, canned fruits with fresh fruits, and  incorporate other fiber sources. White rice, white breads, and most bakery goods contain little or no fiber.   Brown whole-grain rice, buckwheat oats, and many fruits and vegetables are all good sources of fiber. These include: broccoli, Brussels sprouts, cabbage, cauliflower, beets, sweet potatoes, white potatoes (skin on), carrots, tomatoes, eggplant, squash, berries, fresh fruits, and dried fruits.   Cereals appear to be the richest source of fiber. Cereal fiber is found in whole grains and bran. Bran is the fiber-rich outer coat of cereal grain, which is largely removed in refining. In whole-grain cereals, the bran remains. In breakfast cereals, the largest amount of fiber is found in those with "bran" in their names. The fiber content is sometimes indicated on the label.   You may need to include additional fruits and vegetables each day.   In baking, for 1 cup white flour, you may use the following substitutions:   1 cup whole-wheat flour  minus 2 tablespoons.   1/2 cup white flour plus 1/2 cup whole-wheat flour.   Diverticulosis Diverticulosis is a common condition that develops when small pouches (diverticula) form in the wall of the colon. The risk of diverticulosis increases with age. It happens more often in people who eat a low-fiber diet. Most individuals with diverticulosis have no symptoms. Those individuals with symptoms usually experience belly (abdominal) pain, constipation, or loose stools (diarrhea).  HOME CARE INSTRUCTIONS  Increase the amount of fiber in your diet as directed by your caregiver or dietician. This may reduce symptoms of diverticulosis.   Drink at least 6 to 8 glasses of water each day to prevent constipation.   Try not to strain when you have a bowel movement.   Avoiding nuts and seeds to prevent complications is NOT NECESSARY.     FOODS HAVING HIGH FIBER CONTENT INCLUDE:  Fruits. Apple, peach, pear, tangerine, raisins, prunes.   Vegetables.  Brussels sprouts, asparagus, broccoli, cabbage, carrot, cauliflower, romaine lettuce, spinach, summer squash, tomato, winter squash, zucchini.   Starchy Vegetables. Baked beans, kidney beans, lima beans, split peas, lentils, potatoes (with skin).   Grains. Whole wheat bread, brown rice, bran flake cereal, plain oatmeal, white rice, shredded wheat, bran muffins.   SEEK IMMEDIATE MEDICAL CARE IF:  You develop increasing pain or severe bloating.   You have an oral temperature above 101F.   You develop vomiting or bowel movements that are bloody or black.   Hemorrhoids Hemorrhoids are dilated (enlarged) veins around the rectum. Sometimes clots will form in the veins. This makes them swollen and painful. These are called thrombosed hemorrhoids. Causes of hemorrhoids include:  Constipation.   Straining to have a bowel movement.   HEAVY LIFTING HOME CARE INSTRUCTIONS  Eat a well balanced diet and drink 6 to 8 glasses of water every day to avoid constipation. You may also use a bulk laxative.   Avoid straining to have bowel movements.   Keep anal area dry and clean.   Do not use a donut shaped pillow or sit on the toilet for long periods. This increases blood pooling and pain.   Move your bowels when your body has the urge; this will require less straining and will decrease pain and pressure.

## 2015-05-29 ENCOUNTER — Encounter (HOSPITAL_COMMUNITY): Payer: Self-pay | Admitting: Gastroenterology

## 2015-05-30 ENCOUNTER — Telehealth: Payer: Self-pay | Admitting: General Practice

## 2015-05-30 NOTE — Telephone Encounter (Signed)
NIC for TCS in 10 years 

## 2015-05-30 NOTE — Telephone Encounter (Signed)
Please call pt. She had a polypoid lesion, removed and it was benign.   FOLLOW A HIGH FIBER DIET. AVOID ITEMS THAT CAUSE BLOATING.   Next colonoscopy in 10 years.

## 2015-05-30 NOTE — Telephone Encounter (Signed)
Patient called in asking about results from her tcs.  I explained to her that we have a 7-10 day business day to relay results.  She stated to call her cell phone with results.   She will not have minutes on her phone until after 5pm today.

## 2015-05-30 NOTE — Telephone Encounter (Signed)
Routing to Rhame to call patient tomorrow morning with results.

## 2015-05-31 NOTE — Telephone Encounter (Signed)
Pt is aware of results. 

## 2015-05-31 NOTE — Telephone Encounter (Signed)
Opened in error

## 2015-05-31 NOTE — Telephone Encounter (Signed)
LMOM to call.

## 2016-07-14 ENCOUNTER — Encounter (HOSPITAL_COMMUNITY): Payer: Self-pay | Admitting: Emergency Medicine

## 2016-07-14 ENCOUNTER — Emergency Department (HOSPITAL_COMMUNITY): Payer: BLUE CROSS/BLUE SHIELD

## 2016-07-14 ENCOUNTER — Emergency Department (HOSPITAL_COMMUNITY)
Admission: EM | Admit: 2016-07-14 | Discharge: 2016-07-14 | Disposition: A | Discharge status: Home / Self Care | Payer: BLUE CROSS/BLUE SHIELD | Attending: Emergency Medicine | Admitting: Emergency Medicine

## 2016-07-14 DIAGNOSIS — R200 Anesthesia of skin: Secondary | ICD-10-CM | POA: Diagnosis not present

## 2016-07-14 DIAGNOSIS — Z79899 Other long term (current) drug therapy: Secondary | ICD-10-CM | POA: Diagnosis not present

## 2016-07-14 DIAGNOSIS — M6281 Muscle weakness (generalized): Secondary | ICD-10-CM | POA: Insufficient documentation

## 2016-07-14 DIAGNOSIS — Z791 Long term (current) use of non-steroidal anti-inflammatories (NSAID): Secondary | ICD-10-CM | POA: Insufficient documentation

## 2016-07-14 DIAGNOSIS — F17210 Nicotine dependence, cigarettes, uncomplicated: Secondary | ICD-10-CM | POA: Insufficient documentation

## 2016-07-14 DIAGNOSIS — R531 Weakness: Secondary | ICD-10-CM

## 2016-07-14 LAB — CBC WITH DIFFERENTIAL/PLATELET
BASOS PCT: 0 %
Basophils Absolute: 0 10*3/uL (ref 0.0–0.1)
EOS ABS: 0.3 10*3/uL (ref 0.0–0.7)
EOS PCT: 3 %
HCT: 40.4 % (ref 36.0–46.0)
Hemoglobin: 13.9 g/dL (ref 12.0–15.0)
Lymphocytes Relative: 47 %
Lymphs Abs: 3.5 10*3/uL (ref 0.7–4.0)
MCH: 30.4 pg (ref 26.0–34.0)
MCHC: 34.4 g/dL (ref 30.0–36.0)
MCV: 88.4 fL (ref 78.0–100.0)
MONO ABS: 0.4 10*3/uL (ref 0.1–1.0)
Monocytes Relative: 6 %
Neutro Abs: 3.2 10*3/uL (ref 1.7–7.7)
Neutrophils Relative %: 44 %
Platelets: 349 10*3/uL (ref 150–400)
RBC: 4.57 MIL/uL (ref 3.87–5.11)
RDW: 12.7 % (ref 11.5–15.5)
WBC: 7.4 10*3/uL (ref 4.0–10.5)

## 2016-07-14 LAB — COMPREHENSIVE METABOLIC PANEL
ALBUMIN: 4 g/dL (ref 3.5–5.0)
ALT: 21 U/L (ref 14–54)
ANION GAP: 7 (ref 5–15)
AST: 23 U/L (ref 15–41)
Alkaline Phosphatase: 77 U/L (ref 38–126)
BILIRUBIN TOTAL: 0.3 mg/dL (ref 0.3–1.2)
BUN: 13 mg/dL (ref 6–20)
CO2: 26 mmol/L (ref 22–32)
Calcium: 9.7 mg/dL (ref 8.9–10.3)
Chloride: 106 mmol/L (ref 101–111)
Creatinine, Ser: 0.68 mg/dL (ref 0.44–1.00)
GFR calc Af Amer: >60 mL/min (ref >60)
GFR calc non Af Amer: >60 mL/min (ref >60)
Glucose, Bld: 130 mg/dL — ABNORMAL HIGH (ref 65–99)
POTASSIUM: 4.1 mmol/L (ref 3.5–5.1)
Sodium: 139 mmol/L (ref 135–145)
TOTAL PROTEIN: 7.1 g/dL (ref 6.5–8.1)

## 2016-07-14 LAB — TROPONIN I: Troponin I: <0.03 ng/mL (ref <0.03)

## 2016-07-14 NOTE — ED Notes (Signed)
Patient and family verbalize understanding of discharge instructions, home care and follow up care. Patient out of department at this time with family. 

## 2016-07-14 NOTE — ED Provider Notes (Signed)
[facility]EMERGENCY DEPT Provider Note   CSN: [id number] Arrival date & time: 07/14/16  1610     History   Chief Complaint Chief Complaint  Patient presents with  . Extremity Weakness    HPI Lawerance SabalFrances Byard is a 50 y.o. female.  Patient states that her right arm and right leg have not been working correctly for a few days   The history is provided by the patient. No language interpreter was used.  Extremity Weakness  This is a new problem. The current episode started more than 2 days ago. The problem occurs constantly. The problem has not changed since onset.Pertinent negatives include no chest pain, no abdominal pain and no headaches. Nothing aggravates the symptoms. Nothing relieves the symptoms. She has tried nothing for the symptoms. The treatment provided no relief.    History reviewed. No pertinent past medical history.  Patient Active Problem List   Diagnosis Date Noted  . Special screening for malignant neoplasms, colon     Past Surgical History:  Procedure Laterality Date  . COLONOSCOPY N/A 05/26/2015   Procedure: COLONOSCOPY;  Surgeon: West BaliSandi L Fields, MD;  Location: [facility] ENDO SUITE;  Service: Endoscopy;  Laterality: N/A;  2:00  . TUBAL LIGATION      OB History    Gravida Para Term Preterm AB Living   2 2 2     2    SAB TAB Ectopic Multiple Live Births                   Home Medications    Prior to Admission medications   Medication Sig Start Date End Date Taking? Authorizing Provider  naproxen sodium (ANAPROX) 220 MG tablet Take 220 mg by mouth daily as needed (for pain). Only as needed    Yes Historical Provider, MD  ranitidine (ZANTAC) 150 MG tablet Take 150 mg by mouth 2 (two) times daily.   Yes Historical Provider, MD    Family History Family History  Problem Relation Age of Onset  . Diabetes Father     Social History Social History  Substance Use Topics  . Smoking status: Current Every Day Smoker    Packs/day: 1.00    Years: 30.00    Types:  Cigarettes  . Smokeless tobacco: Never Used  . Alcohol use Yes     Comment: rarely     Allergies   Penicillins   Review of Systems Review of Systems  Constitutional: Negative for appetite change and fatigue.  HENT: Negative for congestion, ear discharge and sinus pressure.   Eyes: Negative for discharge.  Respiratory: Negative for cough.   Cardiovascular: Negative for chest pain.  Gastrointestinal: Negative for abdominal pain and diarrhea.  Genitourinary: Negative for frequency and hematuria.  Musculoskeletal: Positive for extremity weakness. Negative for back pain.       Weakness in the right leg and arm  Skin: Negative for rash.  Neurological: Negative for seizures and headaches.  Psychiatric/Behavioral: Negative for hallucinations.     Physical Exam Updated Vital Signs BP 141/78 (BP Location: Right Arm)   Pulse 87   Temp 98.2 F (36.8 C) (Oral)   Resp 16   Ht 5\' 3"  (1.6 m)   Wt 192 lb (87.1 kg)   LMP 08/03/2013   SpO2 100%   BMI 34.01 kg/m   Physical Exam  Constitutional: She is oriented to person, place, and time. She appears well-developed.  HENT:  Head: Normocephalic.  Eyes: Conjunctivae and EOM are normal. No scleral icterus.  Neck: Neck supple.  No thyromegaly present.  Cardiovascular: Normal rate and regular rhythm.  Exam reveals no gallop and no friction rub.   No murmur heard. Pulmonary/Chest: No stridor. She has no wheezes. She has no rales. She exhibits no tenderness.  Abdominal: She exhibits no distension. There is no tenderness. There is no rebound.  Musculoskeletal: Normal range of motion. She exhibits no edema.  Lymphadenopathy:    She has no cervical adenopathy.  Neurological: She is oriented to person, place, and time. She exhibits normal muscle tone. Coordination abnormal.  Patient seems to have poor coordination in her right leg. Right arm seemed to be normal  Skin: No rash noted. No erythema.  Psychiatric: She has a normal mood and  affect. Her behavior is normal.     ED Treatments / Results  Labs (all labs ordered are listed, but only abnormal results are displayed) Labs Reviewed  COMPREHENSIVE METABOLIC PANEL - Abnormal; Notable for the following:       Result Value   Glucose, Bld 130 (*)    All other components within normal limits  CBC WITH DIFFERENTIAL/PLATELET  TROPONIN I    EKG  EKG Interpretation None       Radiology Dg Chest 2 View  Result Date: 07/14/2016 CLINICAL DATA:  Right-sided weakness and numbness for 1 day EXAM: CHEST  2 VIEW COMPARISON:  None. FINDINGS: The heart size and mediastinal contours are within normal limits. Both lungs are clear. The visualized skeletal structures are unremarkable. IMPRESSION: No active cardiopulmonary disease. Electronically Signed   By: Elige Ko   On: 07/14/2016 19:49   Ct Head Wo Contrast  Result Date: 07/14/2016 CLINICAL DATA:  Right-sided numbness and weakness for 1 day EXAM: CT HEAD WITHOUT CONTRAST TECHNIQUE: Contiguous axial images were obtained from the base of the skull through the vertex without intravenous contrast. COMPARISON:  None. FINDINGS: Brain: No evidence of acute infarction, hemorrhage, hydrocephalus, extra-axial collection or mass lesion/mass effect. Vascular: No hyperdense vessel or unexpected calcification. Skull: No osseous abnormality. Sinuses/Orbits: Visualized paranasal sinuses are clear. Visualized mastoid sinuses are clear. Visualized orbits demonstrate no focal abnormality. Other: None IMPRESSION: No acute intracranial pathology. Electronically Signed   By: Elige Ko   On: 07/14/2016 19:46    Procedures Procedures (including critical care time)  Medications Ordered in ED Medications - No data to display   Initial Impression / Assessment and Plan / ED Course  I have reviewed the triage vital signs and the nursing notes.  Pertinent labs & imaging results that were available during my care of the patient were reviewed by  me and considered in my medical decision making (see chart for details).  Clinical Course    Patient report coordination and right leg. And history of problems in right arm. CT scan was negative labs unremarkable. Patient will be set up for outpatient MRI of the head and neck  Final Clinical Impressions(s) / ED Diagnoses   Final diagnoses:  None    New Prescriptions New Prescriptions   No medications on file     Bethann Berkshire, MD 07/14/16 2019

## 2016-07-14 NOTE — ED Triage Notes (Addendum)
Patient c/o intermittent weakness and muscle spasms on right side x1 week. Boy friend states the weakness on right side is now constant. Per patient reports that she loses grip in right hand. Patient states that leg "went out yesterday," Boyfriend states that patient has had some issues with speech, Patient also reports having high blood pressure recently.

## 2016-07-14 NOTE — Discharge Instructions (Signed)
Follow up with your md after you get the mri.  You may need to be referred to a neurologist

## 2016-07-15 ENCOUNTER — Telehealth: Payer: Self-pay | Admitting: Family Medicine

## 2016-07-15 NOTE — Telephone Encounter (Signed)
Pt will contact Dr Lysbeth GalasNyland for hospital follow up

## 2016-07-17 DIAGNOSIS — K219 Gastro-esophageal reflux disease without esophagitis: Secondary | ICD-10-CM | POA: Insufficient documentation

## 2016-07-24 ENCOUNTER — Encounter (HOSPITAL_COMMUNITY)
Admission: RE | Admit: 2016-07-24 | Discharge: 2016-07-24 | Disposition: A | Discharge status: Home / Self Care | Payer: BLUE CROSS/BLUE SHIELD | Source: Ambulatory Visit | Attending: Emergency Medicine | Admitting: Emergency Medicine

## 2016-07-24 ENCOUNTER — Ambulatory Visit (HOSPITAL_COMMUNITY)
Admission: RE | Admit: 2016-07-24 | Discharge: 2016-07-24 | Disposition: A | Discharge status: Home / Self Care | Payer: BLUE CROSS/BLUE SHIELD | Source: Ambulatory Visit | Attending: Emergency Medicine | Admitting: Emergency Medicine

## 2016-07-24 DIAGNOSIS — I6782 Cerebral ischemia: Secondary | ICD-10-CM | POA: Insufficient documentation

## 2016-07-24 DIAGNOSIS — M5031 Other cervical disc degeneration,  high cervical region: Secondary | ICD-10-CM | POA: Diagnosis not present

## 2016-07-24 DIAGNOSIS — R531 Weakness: Secondary | ICD-10-CM | POA: Diagnosis not present

## 2016-07-24 DIAGNOSIS — I639 Cerebral infarction, unspecified: Secondary | ICD-10-CM | POA: Diagnosis not present

## 2016-07-24 DIAGNOSIS — M8938 Hypertrophy of bone, other site: Secondary | ICD-10-CM | POA: Insufficient documentation

## 2016-07-30 DIAGNOSIS — E785 Hyperlipidemia, unspecified: Secondary | ICD-10-CM | POA: Insufficient documentation

## 2016-07-30 DIAGNOSIS — Z8673 Personal history of transient ischemic attack (TIA), and cerebral infarction without residual deficits: Secondary | ICD-10-CM | POA: Insufficient documentation

## 2016-07-30 DIAGNOSIS — I10 Essential (primary) hypertension: Secondary | ICD-10-CM | POA: Insufficient documentation

## 2016-12-10 ENCOUNTER — Emergency Department (HOSPITAL_COMMUNITY): Payer: BLUE CROSS/BLUE SHIELD

## 2016-12-10 ENCOUNTER — Encounter (HOSPITAL_COMMUNITY): Payer: Self-pay | Admitting: Emergency Medicine

## 2016-12-10 ENCOUNTER — Emergency Department (HOSPITAL_COMMUNITY): Payer: BLUE CROSS/BLUE SHIELD | Admitting: Certified Registered"

## 2016-12-10 ENCOUNTER — Encounter (HOSPITAL_COMMUNITY)
Admission: EM | Disposition: A | Discharge status: Home / Self Care | Payer: Self-pay | Source: Home / Self Care | Attending: Surgery

## 2016-12-10 ENCOUNTER — Inpatient Hospital Stay (HOSPITAL_COMMUNITY): Payer: BLUE CROSS/BLUE SHIELD

## 2016-12-10 ENCOUNTER — Inpatient Hospital Stay (HOSPITAL_COMMUNITY)
Admission: EM | Admit: 2016-12-10 | Discharge: 2016-12-14 | DRG: 234 | Disposition: A | Discharge status: Home / Self Care | Payer: BLUE CROSS/BLUE SHIELD | Attending: Surgery | Admitting: Surgery

## 2016-12-10 DIAGNOSIS — E785 Hyperlipidemia, unspecified: Secondary | ICD-10-CM | POA: Diagnosis present

## 2016-12-10 DIAGNOSIS — F17210 Nicotine dependence, cigarettes, uncomplicated: Secondary | ICD-10-CM | POA: Diagnosis present

## 2016-12-10 DIAGNOSIS — Z6836 Body mass index (BMI) 36.0-36.9, adult: Secondary | ICD-10-CM

## 2016-12-10 DIAGNOSIS — Z794 Long term (current) use of insulin: Secondary | ICD-10-CM | POA: Diagnosis not present

## 2016-12-10 DIAGNOSIS — Z7982 Long term (current) use of aspirin: Secondary | ICD-10-CM | POA: Diagnosis not present

## 2016-12-10 DIAGNOSIS — I2510 Atherosclerotic heart disease of native coronary artery without angina pectoris: Secondary | ICD-10-CM

## 2016-12-10 DIAGNOSIS — Z79899 Other long term (current) drug therapy: Secondary | ICD-10-CM | POA: Diagnosis not present

## 2016-12-10 DIAGNOSIS — J45909 Unspecified asthma, uncomplicated: Secondary | ICD-10-CM | POA: Diagnosis present

## 2016-12-10 DIAGNOSIS — K219 Gastro-esophageal reflux disease without esophagitis: Secondary | ICD-10-CM | POA: Diagnosis present

## 2016-12-10 DIAGNOSIS — Z87891 Personal history of nicotine dependence: Secondary | ICD-10-CM | POA: Diagnosis not present

## 2016-12-10 DIAGNOSIS — Z880 Allergy status to penicillin: Secondary | ICD-10-CM

## 2016-12-10 DIAGNOSIS — E669 Obesity, unspecified: Secondary | ICD-10-CM | POA: Diagnosis present

## 2016-12-10 DIAGNOSIS — Z8673 Personal history of transient ischemic attack (TIA), and cerebral infarction without residual deficits: Secondary | ICD-10-CM | POA: Diagnosis not present

## 2016-12-10 DIAGNOSIS — Z951 Presence of aortocoronary bypass graft: Secondary | ICD-10-CM | POA: Diagnosis not present

## 2016-12-10 DIAGNOSIS — I252 Old myocardial infarction: Secondary | ICD-10-CM | POA: Insufficient documentation

## 2016-12-10 DIAGNOSIS — I214 Non-ST elevation (NSTEMI) myocardial infarction: Principal | ICD-10-CM

## 2016-12-10 DIAGNOSIS — I249 Acute ischemic heart disease, unspecified: Secondary | ICD-10-CM | POA: Diagnosis not present

## 2016-12-10 DIAGNOSIS — I25110 Atherosclerotic heart disease of native coronary artery with unstable angina pectoris: Secondary | ICD-10-CM

## 2016-12-10 DIAGNOSIS — D649 Anemia, unspecified: Secondary | ICD-10-CM | POA: Diagnosis present

## 2016-12-10 HISTORY — PX: TEE WITHOUT CARDIOVERSION: SHX5443

## 2016-12-10 HISTORY — DX: Cerebral infarction, unspecified: I63.9

## 2016-12-10 HISTORY — PX: CORONARY ARTERY BYPASS GRAFT: SHX141

## 2016-12-10 HISTORY — PX: LEFT HEART CATH AND CORONARY ANGIOGRAPHY: CATH118249

## 2016-12-10 LAB — POCT I-STAT, CHEM 8
BUN: 8 mg/dL (ref 6–20)
BUN: 6 mg/dL (ref 6–20)
BUN: 7 mg/dL (ref 6–20)
BUN: 7 mg/dL (ref 6–20)
BUN: 8 mg/dL (ref 6–20)
CALCIUM ION: 0.94 mmol/L — AB (ref 1.15–1.40)
CALCIUM ION: 1.16 mmol/L (ref 1.15–1.40)
CALCIUM ION: 1.04 mmol/L — AB (ref 1.15–1.40)
CHLORIDE: 100 mmol/L — AB (ref 101–111)
CHLORIDE: 100 mmol/L — AB (ref 101–111)
CREATININE: 0.5 mg/dL (ref 0.44–1.00)
CREATININE: 0.5 mg/dL (ref 0.44–1.00)
CREATININE: 0.5 mg/dL (ref 0.44–1.00)
CREATININE: 0.5 mg/dL (ref 0.44–1.00)
Calcium, Ion: 1.19 mmol/L (ref 1.15–1.40)
Calcium, Ion: 1.05 mmol/L — ABNORMAL LOW (ref 1.15–1.40)
Chloride: 101 mmol/L (ref 101–111)
Chloride: 103 mmol/L (ref 101–111)
Chloride: 100 mmol/L — ABNORMAL LOW (ref 101–111)
Creatinine, Ser: 0.4 mg/dL — ABNORMAL LOW (ref 0.44–1.00)
GLUCOSE: 105 mg/dL — AB (ref 65–99)
GLUCOSE: 121 mg/dL — AB (ref 65–99)
GLUCOSE: 119 mg/dL — AB (ref 65–99)
Glucose, Bld: 122 mg/dL — ABNORMAL HIGH (ref 65–99)
Glucose, Bld: 129 mg/dL — ABNORMAL HIGH (ref 65–99)
HCT: 26 % — ABNORMAL LOW (ref 36.0–46.0)
HCT: 30 % — ABNORMAL LOW (ref 36.0–46.0)
HCT: 24 % — ABNORMAL LOW (ref 36.0–46.0)
HCT: 24 % — ABNORMAL LOW (ref 36.0–46.0)
HEMATOCRIT: 35 % — AB (ref 36.0–46.0)
HEMOGLOBIN: 11.9 g/dL — AB (ref 12.0–15.0)
HEMOGLOBIN: 10.2 g/dL — AB (ref 12.0–15.0)
Hemoglobin: 8.8 g/dL — ABNORMAL LOW (ref 12.0–15.0)
Hemoglobin: 8.2 g/dL — ABNORMAL LOW (ref 12.0–15.0)
Hemoglobin: 8.2 g/dL — ABNORMAL LOW (ref 12.0–15.0)
POTASSIUM: 3.9 mmol/L (ref 3.5–5.1)
Potassium: 4.2 mmol/L (ref 3.5–5.1)
Potassium: 3.7 mmol/L (ref 3.5–5.1)
Potassium: 4 mmol/L (ref 3.5–5.1)
Potassium: 4.1 mmol/L (ref 3.5–5.1)
Sodium: 141 mmol/L (ref 135–145)
Sodium: 140 mmol/L (ref 135–145)
Sodium: 140 mmol/L (ref 135–145)
Sodium: 139 mmol/L (ref 135–145)
Sodium: 137 mmol/L (ref 135–145)
TCO2: 28 mmol/L (ref 0–100)
TCO2: 27 mmol/L (ref 0–100)
TCO2: 28 mmol/L (ref 0–100)
TCO2: 25 mmol/L (ref 0–100)
TCO2: 25 mmol/L (ref 0–100)

## 2016-12-10 LAB — BASIC METABOLIC PANEL
Anion gap: 8 (ref 5–15)
BUN: 11 mg/dL (ref 6–20)
CALCIUM: 9.2 mg/dL (ref 8.9–10.3)
CHLORIDE: 105 mmol/L (ref 101–111)
CO2: 27 mmol/L (ref 22–32)
CREATININE: 0.72 mg/dL (ref 0.44–1.00)
GFR calc non Af Amer: >60 mL/min (ref >60)
Glucose, Bld: 122 mg/dL — ABNORMAL HIGH (ref 65–99)
Potassium: 4.3 mmol/L (ref 3.5–5.1)
SODIUM: 140 mmol/L (ref 135–145)

## 2016-12-10 LAB — CBC
HCT: 42.5 % (ref 36.0–46.0)
Hemoglobin: 14.5 g/dL (ref 12.0–15.0)
MCH: 29.8 pg (ref 26.0–34.0)
MCHC: 34.1 g/dL (ref 30.0–36.0)
MCV: 87.4 fL (ref 78.0–100.0)
PLATELETS: 390 10*3/uL (ref 150–400)
RBC: 4.86 MIL/uL (ref 3.87–5.11)
RDW: 12.4 % (ref 11.5–15.5)
WBC: 6.8 10*3/uL (ref 4.0–10.5)

## 2016-12-10 LAB — HEPATIC FUNCTION PANEL
ALBUMIN: 4.3 g/dL (ref 3.5–5.0)
ALK PHOS: 97 U/L (ref 38–126)
ALT: 15 U/L (ref 14–54)
AST: 18 U/L (ref 15–41)
Bilirubin, Direct: 0.1 mg/dL (ref 0.1–0.5)
Indirect Bilirubin: 0.3 mg/dL (ref 0.3–0.9)
TOTAL PROTEIN: 7.7 g/dL (ref 6.5–8.1)
Total Bilirubin: 0.4 mg/dL (ref 0.3–1.2)

## 2016-12-10 LAB — POCT I-STAT 3, ART BLOOD GAS (G3+)
Acid-Base Excess: 3 mmol/L — ABNORMAL HIGH (ref 0.0–2.0)
Bicarbonate: 26.9 mmol/L (ref 20.0–28.0)
O2 SAT: 100 %
PH ART: 7.441 (ref 7.350–7.450)
TCO2: 28 mmol/L (ref 0–100)
pCO2 arterial: 39.4 mmHg (ref 32.0–48.0)
pO2, Arterial: 382 mmHg — ABNORMAL HIGH (ref 83.0–108.0)

## 2016-12-10 LAB — ECHO INTRAOPERATIVE TEE
Height: 62 in
WEIGHTICAEL: 3040 [oz_av]

## 2016-12-10 LAB — HEMOGLOBIN AND HEMATOCRIT, BLOOD
HCT: 24.8 % — ABNORMAL LOW (ref 36.0–46.0)
Hemoglobin: 8.5 g/dL — ABNORMAL LOW (ref 12.0–15.0)

## 2016-12-10 LAB — PLATELET COUNT: Platelets: 242 10*3/uL (ref 150–400)

## 2016-12-10 LAB — ABO/RH: ABO/RH(D): O POS

## 2016-12-10 LAB — LIPASE, BLOOD: Lipase: 13 U/L (ref 11–51)

## 2016-12-10 LAB — D-DIMER, QUANTITATIVE (NOT AT ARMC)

## 2016-12-10 LAB — TYPE AND SCREEN
ABO/RH(D): O POS
ANTIBODY SCREEN: NEGATIVE

## 2016-12-10 LAB — TROPONIN I: Troponin I: 0.08 ng/mL (ref <0.03)

## 2016-12-10 SURGERY — LEFT HEART CATH AND CORONARY ANGIOGRAPHY
Anesthesia: LOCAL

## 2016-12-10 SURGERY — CORONARY ARTERY BYPASS GRAFTING (CABG)
Anesthesia: General | Site: Chest

## 2016-12-10 MED ORDER — OXYCODONE HCL 5 MG PO TABS
5.0000 mg | ORAL_TABLET | ORAL | Status: DC | PRN
  Administered 2016-12-11 – 2016-12-12 (×5): 10 mg via ORAL
  Filled 2016-12-10 (×5): qty 2

## 2016-12-10 MED ORDER — LACTATED RINGERS IV SOLN: INTRAVENOUS | Status: DC

## 2016-12-10 MED ORDER — MIDAZOLAM HCL 2 MG/2ML IJ SOLN
INTRAMUSCULAR | Status: DC | PRN
  Administered 2016-12-10: 2 mg via INTRAVENOUS

## 2016-12-10 MED ORDER — IOPAMIDOL (ISOVUE-370) INJECTION 76%
INTRAVENOUS | Status: AC
  Filled 2016-12-10: qty 100

## 2016-12-10 MED ORDER — MIDAZOLAM HCL 10 MG/2ML IJ SOLN
INTRAMUSCULAR | Status: AC
  Filled 2016-12-10: qty 2

## 2016-12-10 MED ORDER — ARTIFICIAL TEARS OP OINT
TOPICAL_OINTMENT | OPHTHALMIC | Status: DC | PRN
  Administered 2016-12-10: 1 via OPHTHALMIC

## 2016-12-10 MED ORDER — SODIUM CHLORIDE 0.9 % IV SOLN: 30.0000 meq | Freq: Once | INTRAVENOUS | Status: DC

## 2016-12-10 MED ORDER — LACTATED RINGERS IV SOLN
INTRAVENOUS | Status: DC | PRN
  Administered 2016-12-10 (×2): via INTRAVENOUS

## 2016-12-10 MED ORDER — METOPROLOL TARTRATE 12.5 MG HALF TABLET
12.5000 mg | ORAL_TABLET | Freq: Two times a day (BID) | ORAL | Status: DC
  Administered 2016-12-11: 12.5 mg via ORAL
  Filled 2016-12-10 (×2): qty 1

## 2016-12-10 MED ORDER — SODIUM CHLORIDE 0.9 % IV SOLN
10.0000 mL/h | INTRAVENOUS | Status: DC
  Administered 2016-12-10: 20 mL/h via INTRAVENOUS

## 2016-12-10 MED ORDER — BISACODYL 10 MG RE SUPP: 10.0000 mg | Freq: Every day | RECTAL | Status: DC

## 2016-12-10 MED ORDER — ASPIRIN 81 MG PO CHEW: 324.0000 mg | CHEWABLE_TABLET | ORAL | Status: DC

## 2016-12-10 MED ORDER — ASPIRIN 81 MG PO CHEW: 324.0000 mg | CHEWABLE_TABLET | Freq: Every day | ORAL | Status: DC

## 2016-12-10 MED ORDER — 0.9 % SODIUM CHLORIDE (POUR BTL) OPTIME
TOPICAL | Status: DC | PRN
  Administered 2016-12-10: 6000 mL

## 2016-12-10 MED ORDER — OXYCODONE-ACETAMINOPHEN 5-325 MG PO TABS
1.0000 | ORAL_TABLET | Freq: Once | ORAL | Status: AC
  Administered 2016-12-10: 1 via ORAL
  Filled 2016-12-10: qty 1

## 2016-12-10 MED ORDER — DEXMEDETOMIDINE HCL IN NACL 400 MCG/100ML IV SOLN
0.1000 ug/kg/h | INTRAVENOUS | Status: AC
  Administered 2016-12-10: .3 ug/kg/h via INTRAVENOUS
  Filled 2016-12-10: qty 100

## 2016-12-10 MED ORDER — NITROGLYCERIN 1 MG/10 ML FOR IR/CATH LAB
INTRA_ARTERIAL | Status: AC
  Filled 2016-12-10: qty 10

## 2016-12-10 MED ORDER — ETOMIDATE 2 MG/ML IV SOLN
INTRAVENOUS | Status: DC | PRN
  Administered 2016-12-10: 16 mg via INTRAVENOUS

## 2016-12-10 MED ORDER — IOPAMIDOL (ISOVUE-370) INJECTION 76%
INTRAVENOUS | Status: DC | PRN
  Administered 2016-12-10: 40 mL via INTRA_ARTERIAL

## 2016-12-10 MED ORDER — DEXMEDETOMIDINE HCL IN NACL 200 MCG/50ML IV SOLN
0.0000 ug/kg/h | INTRAVENOUS | Status: DC
  Administered 2016-12-11: 0.7 ug/kg/h via INTRAVENOUS
  Filled 2016-12-10: qty 50

## 2016-12-10 MED ORDER — VERAPAMIL HCL 2.5 MG/ML IV SOLN
INTRAVENOUS | Status: AC
  Filled 2016-12-10: qty 2

## 2016-12-10 MED ORDER — PANTOPRAZOLE SODIUM 40 MG PO TBEC: 40.0000 mg | DELAYED_RELEASE_TABLET | Freq: Every day | ORAL | Status: DC

## 2016-12-10 MED ORDER — MIDAZOLAM HCL 2 MG/2ML IJ SOLN
INTRAMUSCULAR | Status: AC
  Filled 2016-12-10: qty 2

## 2016-12-10 MED ORDER — HEPARIN (PORCINE) IN NACL 2-0.9 UNIT/ML-% IJ SOLN
INTRAMUSCULAR | Status: AC
  Filled 2016-12-10: qty 1500

## 2016-12-10 MED ORDER — MORPHINE SULFATE (PF) 2 MG/ML IV SOLN
2.0000 mg | INTRAVENOUS | Status: DC | PRN
  Filled 2016-12-10: qty 1
  Filled 2016-12-10: qty 2

## 2016-12-10 MED ORDER — DOCUSATE SODIUM 100 MG PO CAPS
200.0000 mg | ORAL_CAPSULE | Freq: Every day | ORAL | Status: DC
  Administered 2016-12-11: 200 mg via ORAL
  Filled 2016-12-10: qty 2

## 2016-12-10 MED ORDER — SODIUM CHLORIDE 0.9 % IV SOLN: 250.0000 mL | INTRAVENOUS | Status: DC | PRN

## 2016-12-10 MED ORDER — ROCURONIUM BROMIDE 50 MG/5ML IV SOSY
PREFILLED_SYRINGE | INTRAVENOUS | Status: AC
  Filled 2016-12-10: qty 5

## 2016-12-10 MED ORDER — HEPARIN BOLUS VIA INFUSION: 4000.0000 [IU] | Freq: Once | INTRAVENOUS | Status: DC

## 2016-12-10 MED ORDER — SODIUM CHLORIDE 0.9 % IV SOLN: INTRAVENOUS | Status: DC

## 2016-12-10 MED ORDER — SODIUM CHLORIDE 0.9 % IV SOLN
1.5000 mg/kg/h | INTRAVENOUS | Status: AC
  Administered 2016-12-10: 1.5 mg/kg/h via INTRAVENOUS
  Filled 2016-12-10: qty 25

## 2016-12-10 MED ORDER — LIDOCAINE HCL (PF) 1 % IJ SOLN
INTRAMUSCULAR | Status: AC
Start: 2016-12-10 — End: 2016-12-10
  Filled 2016-12-10: qty 30

## 2016-12-10 MED ORDER — SODIUM CHLORIDE 0.9% FLUSH: 3.0000 mL | Freq: Two times a day (BID) | INTRAVENOUS | Status: DC

## 2016-12-10 MED ORDER — NITROGLYCERIN IN D5W 200-5 MCG/ML-% IV SOLN
5.0000 ug/min | INTRAVENOUS | Status: DC
  Administered 2016-12-10: 5 ug/min via INTRAVENOUS
  Filled 2016-12-10: qty 250

## 2016-12-10 MED ORDER — PROTAMINE SULFATE 10 MG/ML IV SOLN
INTRAVENOUS | Status: DC | PRN
  Administered 2016-12-10: 300 mg via INTRAVENOUS

## 2016-12-10 MED ORDER — SODIUM CHLORIDE 0.9% FLUSH: 3.0000 mL | INTRAVENOUS | Status: DC | PRN

## 2016-12-10 MED ORDER — PLASMA-LYTE 148 IV SOLN
INTRAVENOUS | Status: AC
  Administered 2016-12-10: 500 mL
  Filled 2016-12-10: qty 2.5

## 2016-12-10 MED ORDER — CHLORHEXIDINE GLUCONATE 0.12 % MT SOLN
15.0000 mL | OROMUCOSAL | Status: AC
  Administered 2016-12-11: 15 mL via OROMUCOSAL

## 2016-12-10 MED ORDER — THROMBIN 20000 UNITS EX SOLR
OROMUCOSAL | Status: DC | PRN
  Administered 2016-12-10 (×3): 4 mL via TOPICAL

## 2016-12-10 MED ORDER — ASPIRIN EC 325 MG PO TBEC
325.0000 mg | DELAYED_RELEASE_TABLET | Freq: Every day | ORAL | Status: DC
  Administered 2016-12-11: 325 mg via ORAL
  Filled 2016-12-10: qty 1

## 2016-12-10 MED ORDER — LACTATED RINGERS IV SOLN: 500.0000 mL | Freq: Once | INTRAVENOUS | Status: DC | PRN

## 2016-12-10 MED ORDER — ACETAMINOPHEN 650 MG RE SUPP
650.0000 mg | Freq: Once | RECTAL | Status: AC
  Administered 2016-12-11: 650 mg via RECTAL

## 2016-12-10 MED ORDER — EPHEDRINE SULFATE 50 MG/ML IJ SOLN
INTRAMUSCULAR | Status: DC | PRN
  Administered 2016-12-10 (×2): 5 mg via INTRAVENOUS

## 2016-12-10 MED ORDER — DEXTROSE 5 % IV SOLN
1.5000 g | INTRAVENOUS | Status: DC
  Filled 2016-12-10: qty 1.5

## 2016-12-10 MED ORDER — FENTANYL CITRATE (PF) 250 MCG/5ML IJ SOLN
INTRAMUSCULAR | Status: DC | PRN
  Administered 2016-12-10 (×6): 250 ug via INTRAVENOUS

## 2016-12-10 MED ORDER — SODIUM CHLORIDE 0.9 % IV SOLN
30.0000 ug/min | INTRAVENOUS | Status: AC
  Administered 2016-12-10: 20 ug/min via INTRAVENOUS
  Filled 2016-12-10: qty 2

## 2016-12-10 MED ORDER — BISACODYL 5 MG PO TBEC
10.0000 mg | DELAYED_RELEASE_TABLET | Freq: Every day | ORAL | Status: DC
  Administered 2016-12-11: 10 mg via ORAL
  Filled 2016-12-10: qty 2

## 2016-12-10 MED ORDER — NITROGLYCERIN IN D5W 200-5 MCG/ML-% IV SOLN
INTRAVENOUS | Status: DC | PRN
  Administered 2016-12-10: 10 ug/min via INTRAVENOUS

## 2016-12-10 MED ORDER — HEPARIN SODIUM (PORCINE) 1000 UNIT/ML IJ SOLN
INTRAMUSCULAR | Status: DC | PRN
  Administered 2016-12-10: 30000 [IU] via INTRAVENOUS

## 2016-12-10 MED ORDER — VANCOMYCIN HCL IN DEXTROSE 1-5 GM/200ML-% IV SOLN
1000.0000 mg | Freq: Once | INTRAVENOUS | Status: AC
  Administered 2016-12-11: 1000 mg via INTRAVENOUS
  Filled 2016-12-10: qty 200

## 2016-12-10 MED ORDER — ONDANSETRON HCL 4 MG/2ML IJ SOLN
4.0000 mg | Freq: Four times a day (QID) | INTRAMUSCULAR | Status: DC | PRN
  Administered 2016-12-11: 4 mg via INTRAVENOUS
  Filled 2016-12-10: qty 2

## 2016-12-10 MED ORDER — THROMBIN 20000 UNITS EX SOLR
CUTANEOUS | Status: DC | PRN
  Administered 2016-12-10: 20000 [IU] via TOPICAL

## 2016-12-10 MED ORDER — FENTANYL CITRATE (PF) 250 MCG/5ML IJ SOLN
INTRAMUSCULAR | Status: AC
  Filled 2016-12-10: qty 30

## 2016-12-10 MED ORDER — MIDAZOLAM HCL 5 MG/5ML IJ SOLN
INTRAMUSCULAR | Status: DC | PRN
  Administered 2016-12-10 (×2): 2 mg via INTRAVENOUS
  Administered 2016-12-10: 6 mg via INTRAVENOUS

## 2016-12-10 MED ORDER — ASPIRIN 81 MG PO CHEW: 81.0000 mg | CHEWABLE_TABLET | ORAL | Status: DC

## 2016-12-10 MED ORDER — ACETAMINOPHEN 160 MG/5ML PO SOLN: 650.0000 mg | Freq: Once | ORAL | Status: AC

## 2016-12-10 MED ORDER — MAGNESIUM SULFATE 50 % IJ SOLN
40.0000 meq | INTRAMUSCULAR | Status: DC
  Filled 2016-12-10: qty 10

## 2016-12-10 MED ORDER — ASPIRIN 81 MG PO CHEW
324.0000 mg | CHEWABLE_TABLET | Freq: Once | ORAL | Status: DC
  Filled 2016-12-10: qty 4

## 2016-12-10 MED ORDER — TRANEXAMIC ACID (OHS) PUMP PRIME SOLUTION
2.0000 mg/kg | INTRAVENOUS | Status: DC
  Filled 2016-12-10: qty 1.72

## 2016-12-10 MED ORDER — CEFUROXIME SODIUM 750 MG IJ SOLR
750.0000 mg | INTRAMUSCULAR | Status: DC
  Filled 2016-12-10: qty 750

## 2016-12-10 MED ORDER — ETOMIDATE 2 MG/ML IV SOLN
INTRAVENOUS | Status: AC
  Filled 2016-12-10: qty 10

## 2016-12-10 MED ORDER — HEPARIN SODIUM (PORCINE) 5000 UNIT/ML IJ SOLN
4000.0000 [IU] | INTRAMUSCULAR | Status: AC
  Administered 2016-12-10: 4000 [IU] via INTRAVENOUS
  Filled 2016-12-10: qty 1

## 2016-12-10 MED ORDER — ALBUMIN HUMAN 5 % IV SOLN
250.0000 mL | INTRAVENOUS | Status: DC | PRN
  Administered 2016-12-11 (×2): 250 mL via INTRAVENOUS

## 2016-12-10 MED ORDER — MIDAZOLAM HCL 2 MG/2ML IJ SOLN
2.0000 mg | INTRAMUSCULAR | Status: DC | PRN
  Administered 2016-12-11: 2 mg via INTRAVENOUS
  Filled 2016-12-10: qty 2

## 2016-12-10 MED ORDER — SODIUM CHLORIDE 0.9 % IV SOLN
INTRAVENOUS | Status: DC
  Filled 2016-12-10: qty 30

## 2016-12-10 MED ORDER — INSULIN REGULAR BOLUS VIA INFUSION
0.0000 [IU] | Freq: Three times a day (TID) | INTRAVENOUS | Status: DC
  Filled 2016-12-10: qty 10

## 2016-12-10 MED ORDER — ACETAMINOPHEN 500 MG PO TABS
1000.0000 mg | ORAL_TABLET | Freq: Four times a day (QID) | ORAL | Status: DC
  Filled 2016-12-10 (×2): qty 2

## 2016-12-10 MED ORDER — ARTIFICIAL TEARS OP OINT
TOPICAL_OINTMENT | OPHTHALMIC | Status: AC
  Filled 2016-12-10: qty 3.5

## 2016-12-10 MED ORDER — TRANEXAMIC ACID (OHS) BOLUS VIA INFUSION
15.0000 mg/kg | INTRAVENOUS | Status: AC
  Administered 2016-12-10: 1293 mg via INTRAVENOUS
  Filled 2016-12-10: qty 1293

## 2016-12-10 MED ORDER — NITROGLYCERIN IN D5W 200-5 MCG/ML-% IV SOLN: 0.0000 ug/min | INTRAVENOUS | Status: DC

## 2016-12-10 MED ORDER — ROCURONIUM BROMIDE 100 MG/10ML IV SOLN
INTRAVENOUS | Status: DC | PRN
  Administered 2016-12-10 (×2): 50 mg via INTRAVENOUS

## 2016-12-10 MED ORDER — LEVOFLOXACIN IN D5W 500 MG/100ML IV SOLN
500.0000 mg | INTRAVENOUS | Status: AC
  Administered 2016-12-10: 500 mg via INTRAVENOUS
  Filled 2016-12-10: qty 100

## 2016-12-10 MED ORDER — SODIUM CHLORIDE 0.9 % WEIGHT BASED INFUSION: 1.0000 mL/kg/h | INTRAVENOUS | Status: DC

## 2016-12-10 MED ORDER — METOPROLOL TARTRATE 5 MG/5ML IV SOLN: 2.5000 mg | INTRAVENOUS | Status: DC | PRN

## 2016-12-10 MED ORDER — SODIUM CHLORIDE 0.9 % WEIGHT BASED INFUSION: 3.0000 mL/kg/h | INTRAVENOUS | Status: DC

## 2016-12-10 MED ORDER — LIDOCAINE 2% (20 MG/ML) 5 ML SYRINGE
INTRAMUSCULAR | Status: AC
  Filled 2016-12-10: qty 5

## 2016-12-10 MED ORDER — SUCCINYLCHOLINE CHLORIDE 200 MG/10ML IV SOSY
PREFILLED_SYRINGE | INTRAVENOUS | Status: AC
  Filled 2016-12-10: qty 10

## 2016-12-10 MED ORDER — LEVOFLOXACIN IN D5W 750 MG/150ML IV SOLN
750.0000 mg | INTRAVENOUS | Status: AC
  Administered 2016-12-11: 750 mg via INTRAVENOUS
  Filled 2016-12-10: qty 150

## 2016-12-10 MED ORDER — POTASSIUM CHLORIDE 2 MEQ/ML IV SOLN
80.0000 meq | INTRAVENOUS | Status: DC
  Filled 2016-12-10: qty 40

## 2016-12-10 MED ORDER — ASPIRIN 325 MG PO TABS
325.0000 mg | ORAL_TABLET | Freq: Once | ORAL | Status: AC
  Administered 2016-12-10: 325 mg via ORAL
  Filled 2016-12-10: qty 1

## 2016-12-10 MED ORDER — THROMBIN 20000 UNITS EX SOLR
CUTANEOUS | Status: AC
  Filled 2016-12-10: qty 20000

## 2016-12-10 MED ORDER — FENTANYL CITRATE (PF) 100 MCG/2ML IJ SOLN
INTRAMUSCULAR | Status: AC
  Filled 2016-12-10: qty 2

## 2016-12-10 MED ORDER — LIDOCAINE HCL (PF) 1 % IJ SOLN
INTRAMUSCULAR | Status: AC
  Filled 2016-12-10: qty 30

## 2016-12-10 MED ORDER — HEPARIN (PORCINE) IN NACL 100-0.45 UNIT/ML-% IJ SOLN
INTRAMUSCULAR | Status: DC | PRN
  Administered 2016-12-10: 1000 [IU]/h via INTRAVENOUS

## 2016-12-10 MED ORDER — DOPAMINE-DEXTROSE 3.2-5 MG/ML-% IV SOLN
0.0000 ug/kg/min | INTRAVENOUS | Status: DC
  Filled 2016-12-10: qty 250

## 2016-12-10 MED ORDER — FENTANYL CITRATE (PF) 100 MCG/2ML IJ SOLN
INTRAMUSCULAR | Status: DC | PRN
  Administered 2016-12-10: 25 ug via INTRAVENOUS

## 2016-12-10 MED ORDER — SUCCINYLCHOLINE CHLORIDE 20 MG/ML IJ SOLN
INTRAMUSCULAR | Status: DC | PRN
  Administered 2016-12-10: 120 mg via INTRAVENOUS

## 2016-12-10 MED ORDER — HEPARIN (PORCINE) IN NACL 2-0.9 UNIT/ML-% IJ SOLN
INTRAMUSCULAR | Status: DC | PRN
  Administered 2016-12-10: 1500 mL via INTRA_ARTERIAL

## 2016-12-10 MED ORDER — NITROGLYCERIN 2 % TD OINT
1.0000 [in_us] | TOPICAL_OINTMENT | Freq: Once | TRANSDERMAL | Status: AC
  Administered 2016-12-10: 1 [in_us] via TOPICAL
  Filled 2016-12-10: qty 1

## 2016-12-10 MED ORDER — FAMOTIDINE IN NACL 20-0.9 MG/50ML-% IV SOLN
20.0000 mg | Freq: Two times a day (BID) | INTRAVENOUS | Status: DC
  Administered 2016-12-11: 20 mg via INTRAVENOUS

## 2016-12-10 MED ORDER — LIDOCAINE HCL (PF) 1 % IJ SOLN
INTRAMUSCULAR | Status: DC | PRN
  Administered 2016-12-10: 18 mL via INTRADERMAL

## 2016-12-10 MED ORDER — METOPROLOL TARTRATE 25 MG/10 ML ORAL SUSPENSION: 12.5000 mg | Freq: Two times a day (BID) | ORAL | Status: DC

## 2016-12-10 MED ORDER — TRAMADOL HCL 50 MG PO TABS
50.0000 mg | ORAL_TABLET | ORAL | Status: DC | PRN
  Administered 2016-12-11 – 2016-12-12 (×2): 100 mg via ORAL
  Filled 2016-12-10 (×2): qty 2

## 2016-12-10 MED ORDER — MORPHINE SULFATE (PF) 4 MG/ML IV SOLN
4.0000 mg | Freq: Once | INTRAVENOUS | Status: AC
Start: 2016-12-10 — End: 2016-12-10
  Administered 2016-12-10: 4 mg via INTRAVENOUS
  Filled 2016-12-10: qty 1

## 2016-12-10 MED ORDER — VANCOMYCIN HCL 10 G IV SOLR
1500.0000 mg | INTRAVENOUS | Status: AC
  Administered 2016-12-10: 1500 mg via INTRAVENOUS
  Filled 2016-12-10: qty 1500

## 2016-12-10 MED ORDER — SODIUM CHLORIDE 0.9 % IV SOLN: 0.0000 ug/min | INTRAVENOUS | Status: DC

## 2016-12-10 MED ORDER — MORPHINE SULFATE (PF) 2 MG/ML IV SOLN
1.0000 mg | INTRAVENOUS | Status: DC | PRN
  Administered 2016-12-11 (×3): 2 mg via INTRAVENOUS

## 2016-12-10 MED ORDER — NITROGLYCERIN IN D5W 200-5 MCG/ML-% IV SOLN
2.0000 ug/min | INTRAVENOUS | Status: AC
  Administered 2016-12-10: 30 ug/min via INTRAVENOUS
  Filled 2016-12-10: qty 250

## 2016-12-10 MED ORDER — SODIUM CHLORIDE 0.45 % IV SOLN
INTRAVENOUS | Status: DC | PRN
  Administered 2016-12-11: via INTRAVENOUS

## 2016-12-10 MED ORDER — INSULIN REGULAR HUMAN 100 UNIT/ML IJ SOLN
INTRAMUSCULAR | Status: AC
  Administered 2016-12-10: 1 [IU]/h via INTRAVENOUS
  Filled 2016-12-10 (×2): qty 2.5

## 2016-12-10 MED ORDER — SODIUM CHLORIDE 0.9 % IV SOLN: 250.0000 mL | INTRAVENOUS | Status: DC

## 2016-12-10 MED ORDER — ACETAMINOPHEN 160 MG/5ML PO SOLN
1000.0000 mg | Freq: Four times a day (QID) | ORAL | Status: DC
  Administered 2016-12-11: 1000 mg
  Filled 2016-12-10: qty 40.6

## 2016-12-10 MED ORDER — DEXTROSE 5 % IV SOLN
0.0000 ug/min | INTRAVENOUS | Status: DC
  Filled 2016-12-10: qty 4

## 2016-12-10 MED ORDER — HEMOSTATIC AGENTS (NO CHARGE) OPTIME
TOPICAL | Status: DC | PRN
  Administered 2016-12-10: 1 via TOPICAL

## 2016-12-10 MED ORDER — HEPARIN (PORCINE) IN NACL 100-0.45 UNIT/ML-% IJ SOLN
1000.0000 [IU]/h | INTRAMUSCULAR | Status: DC
  Filled 2016-12-10: qty 250

## 2016-12-10 MED ORDER — HEPARIN SODIUM (PORCINE) 1000 UNIT/ML IJ SOLN: INTRAMUSCULAR | Status: DC | PRN

## 2016-12-10 MED ORDER — MAGNESIUM SULFATE 4 GM/100ML IV SOLN
4.0000 g | Freq: Once | INTRAVENOUS | Status: AC
  Administered 2016-12-11: 4 g via INTRAVENOUS
  Filled 2016-12-10: qty 100

## 2016-12-10 SURGICAL SUPPLY — 104 items
BAG DECANTER FOR FLEXI CONT (MISCELLANEOUS) ×3 IMPLANT
BANDAGE ACE 4X5 VEL STRL LF (GAUZE/BANDAGES/DRESSINGS) ×3 IMPLANT
BANDAGE ACE 6X5 VEL STRL LF (GAUZE/BANDAGES/DRESSINGS) ×3 IMPLANT
BASKET HEART (ORDER IN 25'S) (MISCELLANEOUS) ×1
BASKET HEART (ORDER IN 25S) (MISCELLANEOUS) ×2 IMPLANT
BENZOIN TINCTURE PRP APPL 2/3 (GAUZE/BANDAGES/DRESSINGS) ×3 IMPLANT
BIOPATCH RED 1 DISK 7.0 (GAUZE/BANDAGES/DRESSINGS) ×3 IMPLANT
BLADE STERNUM SYSTEM 6 (BLADE) ×3 IMPLANT
BNDG GAUZE ELAST 4 BULKY (GAUZE/BANDAGES/DRESSINGS) ×3 IMPLANT
CANISTER SUCTION 2500CC (MISCELLANEOUS) ×3 IMPLANT
CATH ROBINSON RED A/P 18FR (CATHETERS) ×6 IMPLANT
CATH THORACIC 28FR (CATHETERS) ×3 IMPLANT
CATH THORACIC 36FR (CATHETERS) ×3 IMPLANT
CATH THORACIC 36FR RT ANG (CATHETERS) ×3 IMPLANT
CLIP TI MEDIUM 24 (CLIP) IMPLANT
CLIP TI WIDE RED SMALL 24 (CLIP) ×3 IMPLANT
CLSR STERI-STRIP ANTIMIC 1/2X4 (GAUZE/BANDAGES/DRESSINGS) ×3 IMPLANT
COVER MAYO STAND STRL (DRAPES) ×3 IMPLANT
CRADLE DONUT ADULT HEAD (MISCELLANEOUS) ×3 IMPLANT
DRAPE CARDIOVASCULAR INCISE (DRAPES) ×1
DRAPE SLUSH/WARMER DISC (DRAPES) ×3 IMPLANT
DRAPE SRG 135X102X78XABS (DRAPES) ×2 IMPLANT
DRSG COVADERM 4X14 (GAUZE/BANDAGES/DRESSINGS) ×3 IMPLANT
DRSG SORBAVIEW 3.5X5-5/16 MED (GAUZE/BANDAGES/DRESSINGS) ×3 IMPLANT
ELECT CAUTERY BLADE 6.4 (BLADE) ×3 IMPLANT
ELECT REM PT RETURN 9FT ADLT (ELECTROSURGICAL) ×6
ELECTRODE REM PT RTRN 9FT ADLT (ELECTROSURGICAL) ×4 IMPLANT
FELT TEFLON 1X6 (MISCELLANEOUS) ×6 IMPLANT
GAUZE SPONGE 4X4 12PLY STRL (GAUZE/BANDAGES/DRESSINGS) ×6 IMPLANT
GLOVE BIO SURGEON STRL SZ 6 (GLOVE) IMPLANT
GLOVE BIO SURGEON STRL SZ 6.5 (GLOVE) ×18 IMPLANT
GLOVE BIO SURGEON STRL SZ7 (GLOVE) ×12 IMPLANT
GLOVE BIO SURGEON STRL SZ7.5 (GLOVE) IMPLANT
GLOVE BIOGEL PI IND STRL 6 (GLOVE) IMPLANT
GLOVE BIOGEL PI IND STRL 6.5 (GLOVE) ×12 IMPLANT
GLOVE BIOGEL PI IND STRL 7.0 (GLOVE) ×8 IMPLANT
GLOVE BIOGEL PI INDICATOR 6 (GLOVE)
GLOVE BIOGEL PI INDICATOR 6.5 (GLOVE) ×6
GLOVE BIOGEL PI INDICATOR 7.0 (GLOVE) ×4
GLOVE EUDERMIC 7 POWDERFREE (GLOVE) ×6 IMPLANT
GLOVE ORTHO TXT STRL SZ7.5 (GLOVE) IMPLANT
GOWN STRL REUS W/ TWL LRG LVL3 (GOWN DISPOSABLE) ×8 IMPLANT
GOWN STRL REUS W/ TWL XL LVL3 (GOWN DISPOSABLE) ×2 IMPLANT
GOWN STRL REUS W/TWL LRG LVL3 (GOWN DISPOSABLE) ×4
GOWN STRL REUS W/TWL XL LVL3 (GOWN DISPOSABLE) ×3
HEMOSTAT POWDER SURGIFOAM 1G (HEMOSTASIS) ×9 IMPLANT
HEMOSTAT SURGICEL 2X14 (HEMOSTASIS) ×3 IMPLANT
INSERT FOGARTY 61MM (MISCELLANEOUS) IMPLANT
INSERT FOGARTY XLG (MISCELLANEOUS) IMPLANT
KIT BASIN OR (CUSTOM PROCEDURE TRAY) ×3 IMPLANT
KIT CATH CPB BARTLE (MISCELLANEOUS) ×3 IMPLANT
KIT ROOM TURNOVER OR (KITS) ×3 IMPLANT
KIT SUCTION CATH 14FR (SUCTIONS) ×3 IMPLANT
KIT VASOVIEW HEMOPRO VH 3000 (KITS) ×3 IMPLANT
NS IRRIG 1000ML POUR BTL (IV SOLUTION) ×15 IMPLANT
PACK OPEN HEART (CUSTOM PROCEDURE TRAY) ×3 IMPLANT
PAD ARMBOARD 7.5X6 YLW CONV (MISCELLANEOUS) ×6 IMPLANT
PAD ELECT DEFIB RADIOL ZOLL (MISCELLANEOUS) ×3 IMPLANT
PENCIL BUTTON HOLSTER BLD 10FT (ELECTRODE) ×3 IMPLANT
PUNCH AORTIC ROTATE 4.0MM (MISCELLANEOUS) IMPLANT
PUNCH AORTIC ROTATE 4.5MM 8IN (MISCELLANEOUS) ×3 IMPLANT
PUNCH AORTIC ROTATE 5MM 8IN (MISCELLANEOUS) IMPLANT
SET CARDIOPLEGIA MPS 5001102 (MISCELLANEOUS) ×3 IMPLANT
SOLUTION ANTI FOG 6CC (MISCELLANEOUS) ×3 IMPLANT
SPONGE GAUZE 4X4 12PLY STER LF (GAUZE/BANDAGES/DRESSINGS) ×6 IMPLANT
SPONGE INTESTINAL PEANUT (DISPOSABLE) IMPLANT
SPONGE LAP 18X18 X RAY DECT (DISPOSABLE) IMPLANT
SPONGE LAP 4X18 X RAY DECT (DISPOSABLE) ×3 IMPLANT
SUT BONE WAX W31G (SUTURE) ×3 IMPLANT
SUT MNCRL AB 4-0 PS2 18 (SUTURE) IMPLANT
SUT PROLENE 3 0 SH DA (SUTURE) IMPLANT
SUT PROLENE 3 0 SH1 36 (SUTURE) ×3 IMPLANT
SUT PROLENE 4 0 RB 1 (SUTURE)
SUT PROLENE 4 0 SH DA (SUTURE) IMPLANT
SUT PROLENE 4-0 RB1 .5 CRCL 36 (SUTURE) IMPLANT
SUT PROLENE 5 0 C 1 36 (SUTURE) IMPLANT
SUT PROLENE 6 0 C 1 30 (SUTURE) IMPLANT
SUT PROLENE 7 0 BV 1 (SUTURE) ×3 IMPLANT
SUT PROLENE 7 0 BV1 MDA (SUTURE) ×3 IMPLANT
SUT PROLENE 8 0 BV175 6 (SUTURE) ×3 IMPLANT
SUT PROLENE POLY MONO (SUTURE) ×3 IMPLANT
SUT SILK  1 MH (SUTURE)
SUT SILK 1 MH (SUTURE) IMPLANT
SUT STEEL STERNAL CCS#1 18IN (SUTURE) IMPLANT
SUT STEEL SZ 6 DBL 3X14 BALL (SUTURE) ×9 IMPLANT
SUT VIC AB 1 CTX 36 (SUTURE) ×6
SUT VIC AB 1 CTX36XBRD ANBCTR (SUTURE) ×4 IMPLANT
SUT VIC AB 2-0 CT1 27 (SUTURE) ×2
SUT VIC AB 2-0 CT1 TAPERPNT 27 (SUTURE) ×2 IMPLANT
SUT VIC AB 2-0 CTX 27 (SUTURE) IMPLANT
SUT VIC AB 3-0 SH 27 (SUTURE)
SUT VIC AB 3-0 SH 27X BRD (SUTURE) IMPLANT
SUT VIC AB 3-0 X1 27 (SUTURE) ×3 IMPLANT
SUT VICRYL 4-0 PS2 18IN ABS (SUTURE) IMPLANT
SUTURE E-PAK OPEN HEART (SUTURE) ×3 IMPLANT
SYSTEM SAHARA CHEST DRAIN ATS (WOUND CARE) ×3 IMPLANT
TAPE CLOTH SURG 4X10 WHT LF (GAUZE/BANDAGES/DRESSINGS) ×3 IMPLANT
TAPE PAPER 2X10 WHT MICROPORE (GAUZE/BANDAGES/DRESSINGS) ×3 IMPLANT
TOWEL OR 17X24 6PK STRL BLUE (TOWEL DISPOSABLE) ×3 IMPLANT
TOWEL OR 17X26 10 PK STRL BLUE (TOWEL DISPOSABLE) ×3 IMPLANT
TRAY FOLEY IC TEMP SENS 16FR (CATHETERS) ×3 IMPLANT
TUBING INSUFFLATION (TUBING) ×3 IMPLANT
UNDERPAD 30X30 (UNDERPADS AND DIAPERS) ×3 IMPLANT
WATER STERILE IRR 1000ML POUR (IV SOLUTION) ×6 IMPLANT

## 2016-12-10 SURGICAL SUPPLY — 9 items
CATH INFINITI 5FR MULTPACK ANG (CATHETERS) ×2 IMPLANT
ELECT DEFIB PAD ADLT CADENCE (PAD) ×2 IMPLANT
KIT HEART LEFT (KITS) ×2 IMPLANT
PACK CARDIAC CATHETERIZATION (CUSTOM PROCEDURE TRAY) ×2 IMPLANT
SHEATH PINNACLE 6F 10CM (SHEATH) ×2 IMPLANT
SYR MEDRAD MARK V 150ML (SYRINGE) ×2 IMPLANT
TRANSDUCER W/STOPCOCK (MISCELLANEOUS) ×2 IMPLANT
TUBING CIL FLEX 10 FLL-RA (TUBING) ×2 IMPLANT
WIRE EMERALD 3MM-J .035X150CM (WIRE) ×2 IMPLANT

## 2016-12-10 NOTE — Anesthesia Procedure Notes (Signed)
Anesthesia Procedure Note Procedures: Right IJ Theone MurdochSwan Ganz Catheter Insertion: [id number] The patient was identified and consent obtained.  TO was performed, and full barrier precautions were used.  The skin was anesthetized with lidocaine-4cc plain with 25g needle.  Once the vein was located with the 22 ga. needle using ultrasound guidance , the wire was inserted into the vein.  The wire location was confirmed with ultrasound.  The tissue was dilated and the 8.5 [place] cordis catheter was carefully inserted. Afterwards Theone MurdochSwan Ganz catheter was inserted. PA catheter at 40cm.  The patient tolerated the procedure well.

## 2016-12-10 NOTE — ED Notes (Signed)
CRITICAL VALUE ALERT  Critical value received:  Troponin 0.08  Date of notification:  12/10/16  Time of notification:  1534  Critical value read back:Yes.    Nurse who received alert:  Berdine DanceMandi Elisha Cooksey RN  MD notified (1st page):  [place]  Time of first page:  1534  MD notified (2nd page):  Time of second page:  Responding MD:  Estell HarpinZammit  Time MD responded:  1534

## 2016-12-10 NOTE — Anesthesia Preprocedure Evaluation (Addendum)
Anesthesia Evaluation  Patient identified by MRN, date of birth, ID band Patient awake    Reviewed: Allergy & Precautions, NPO status , Patient's Chart, lab work & pertinent test results  History of Anesthesia Complications Negative for: history of anesthetic complications  Airway Mallampati: III   Neck ROM: Full    Dental  (+) Poor Dentition, Missing   Pulmonary Current Smoker,    breath sounds clear to auscultation       Cardiovascular + Past MI   Rhythm:Regular Rate:Normal     Neuro/Psych CVA, Residual Symptoms    GI/Hepatic   Endo/Other    Renal/GU      Musculoskeletal   Abdominal   Peds  Hematology   Anesthesia Other Findings   Reproductive/Obstetrics                            Anesthesia Physical Anesthesia Plan  ASA: IV and emergent  Anesthesia Plan: General   Post-op Pain Management:    Induction: Intravenous  Airway Management Planned: Oral ETT  Additional Equipment: Arterial line, CVP, PA Cath, 3D TEE and Ultrasound Guidance Line Placement  Intra-op Plan:   Post-operative Plan: Post-operative intubation/ventilation  Informed Consent: I have reviewed the patients History and Physical, chart, labs and discussed the procedure including the risks, benefits and alternatives for the proposed anesthesia with the patient or authorized representative who has indicated his/her understanding and acceptance.   Only emergency history available and Dental advisory given  Plan Discussed with: Anesthesiologist, Surgeon and CRNA  Anesthesia Plan Comments:        Anesthesia Quick Evaluation

## 2016-12-10 NOTE — Anesthesia Procedure Notes (Signed)
Procedure Name: Intubation Date/Time: 12/10/2016 7:22 PM Performed by: Manuela Schwartz B Pre-anesthesia Checklist: Patient identified, Emergency Drugs available, Suction available, Patient being monitored and Timeout performed Patient Re-evaluated:Patient Re-evaluated prior to inductionOxygen Delivery Method: Circle system utilized Preoxygenation: Pre-oxygenation with 100% oxygen Intubation Type: IV induction, Rapid sequence and Cricoid Pressure applied Laryngoscope Size: Mac and 3 Grade View: Grade I Tube type: Subglottic suction tube Tube size: 7.5 mm Number of attempts: 1 Airway Equipment and Method: Stylet Placement Confirmation: ETT inserted through vocal cords under direct vision,  positive ETCO2 and breath sounds checked- equal and bilateral Secured at: 22 cm Tube secured with: Tape Dental Injury: Teeth and Oropharynx as per pre-operative assessment

## 2016-12-10 NOTE — ED Provider Notes (Signed)
[facility]-EMERGENCY DEPT Provider Note   CSN: [id number] Arrival date & time: 12/10/16  1402     History   Chief Complaint Chief Complaint  Patient presents with  . Chest Pain    HPI Cindy Garrett is a 52 y.o. female.  Patient states she's been having chest pain off-and-on for the last couple weeks. The pain seems be worse when she is walking.  Patient states the pain seems be getting worse today   The history is provided by the patient.  Chest Pain   This is a new problem. The current episode started 6 to 12 hours ago. The problem occurs every several days. The problem has not changed since onset.The pain is associated with exertion. The pain is present in the substernal region. The pain is at a severity of 3/10. The pain is mild. The quality of the pain is described as dull. The pain does not radiate. Pertinent negatives include no abdominal pain, no back pain, no cough and no headaches.  Pertinent negatives for past medical history include no seizures.    Past Medical History:  Diagnosis Date  . Stroke [facility])     Patient Active Problem List   Diagnosis Date Noted  . Special screening for malignant neoplasms, colon     Past Surgical History:  Procedure Laterality Date  . COLONOSCOPY N/A 05/26/2015   Procedure: COLONOSCOPY;  Surgeon: West Bali, MD;  Location: [facility] ENDO SUITE;  Service: Endoscopy;  Laterality: N/A;  2:00  . TUBAL LIGATION      OB History    Gravida Para Term Preterm AB Living   2 2 2     2    SAB TAB Ectopic Multiple Live Births                   Home Medications    Prior to Admission medications   Not on File    Family History Family History  Problem Relation Age of Onset  . Diabetes Father     Social History Social History  Substance Use Topics  . Smoking status: Current Every Day Smoker    Packs/day: 1.00    Years: 30.00    Types: Cigarettes  . Smokeless tobacco: Never Used  . Alcohol use Yes     Comment: rarely      Allergies   Penicillins   Review of Systems Review of Systems  Constitutional: Negative for appetite change and fatigue.  HENT: Negative for congestion, ear discharge and sinus pressure.   Eyes: Negative for discharge.  Respiratory: Negative for cough.   Cardiovascular: Positive for chest pain.  Gastrointestinal: Negative for abdominal pain and diarrhea.  Genitourinary: Negative for frequency and hematuria.  Musculoskeletal: Negative for back pain.  Skin: Negative for rash.  Neurological: Negative for seizures and headaches.  Psychiatric/Behavioral: Negative for hallucinations.     Physical Exam Updated Vital Signs BP 143/77   Pulse 65   Temp 97.7 F (36.5 C) (Temporal)   Resp 22   Ht 5\' 2"  (1.575 m)   Wt 190 lb (86.2 kg)   LMP 08/03/2013   SpO2 100%   BMI 34.75 kg/m   Physical Exam  Constitutional: She is oriented to person, place, and time. She appears well-developed.  HENT:  Head: Normocephalic.  Eyes: Conjunctivae and EOM are normal. No scleral icterus.  Neck: Neck supple. No tracheal deviation present. No thyromegaly present.  Cardiovascular: Normal rate and regular rhythm.  Exam reveals no gallop and no friction rub.  No murmur heard. Pulmonary/Chest: No stridor. She has no wheezes. She has no rales. She exhibits tenderness.  Abdominal: She exhibits no distension. There is no tenderness. There is no rebound.  Musculoskeletal: Normal range of motion. She exhibits no edema.  Lymphadenopathy:    She has no cervical adenopathy.  Neurological: She is oriented to person, place, and time. She exhibits normal muscle tone. Coordination normal.  Skin: Skin is warm. No rash noted. No erythema.  Psychiatric: She has a normal mood and affect. Her behavior is normal.     ED Treatments / Results  Labs (all labs ordered are listed, but only abnormal results are displayed) Labs Reviewed  BASIC METABOLIC PANEL - Abnormal; Notable for the following:       Result  Value   Glucose, Bld 122 (*)    All other components within normal limits  TROPONIN I - Abnormal; Notable for the following:    Troponin I 0.08 (*)    All other components within normal limits  CBC  D-DIMER, QUANTITATIVE (NOT AT [facility])  HEPATIC FUNCTION PANEL  LIPASE, BLOOD  DIFFERENTIAL  PROTIME-INR  APTT  LIPID PANEL    EKG  EKG Interpretation  Date/Time:  Tuesday December 10 2016 14:09:13 EST Ventricular Rate:  71 PR Interval:  138 QRS Duration: 80 QT Interval:  418 QTC Calculation: 454 R Axis:   -19 Text Interpretation:  Normal sinus rhythm Left ventricular hypertrophy Cannot rule out Septal infarct , age undetermined Abnormal ECG Confirmed by Paschal Blanton  MD, Adaya Garmany [phone]([id number]) on 12/10/2016 3:19:11 PM       Radiology Dg Chest 2 View  Result Date: 12/10/2016 CLINICAL DATA:  Chest/ abdominal pain, asthma, smoker EXAM: CHEST  2 VIEW COMPARISON:  07/14/2016 FINDINGS: Lungs are clear.  No pleural effusion or pneumothorax. The heart is normal in size. Visualized osseous structures are within normal limits. IMPRESSION: Normal chest radiographs. Electronically Signed   By: Charline BillsSriyesh  Krishnan M.D.   On: 12/10/2016 14:56    Procedures Procedures (including critical care time)  Medications Ordered in ED Medications  0.9 %  sodium chloride infusion (20 mL/hr Intravenous New Bag/Given 12/10/16 1626)  aspirin chewable tablet 324 mg (not administered)  heparin injection 4,000 Units (not administered)  nitroGLYCERIN 50 mg in dextrose 5 % 250 mL (0.2 mg/mL) infusion (not administered)  oxyCODONE-acetaminophen (PERCOCET/ROXICET) 5-325 MG per tablet 1 tablet (1 tablet Oral Given 12/10/16 1533)  morphine 4 MG/ML injection 4 mg (4 mg Intravenous Given 12/10/16 1624)  nitroGLYCERIN (NITROGLYN) 2 % ointment 1 inch (1 inch Topical Given 12/10/16 1611)  aspirin tablet 325 mg (325 mg Oral Given 12/10/16 1608)     Initial Impression / Assessment and Plan / ED Course  I have reviewed the triage vital signs  and the nursing notes.  Pertinent labs & imaging results that were available during my care of the patient were reviewed by me and considered in my medical decision making (see chart for details).    CRITICAL CARE Performed by: Teirra Carapia L Total critical care time: 45 minutes Critical care time was exclusive of separately billable procedures and treating other patients. Critical care was necessary to treat or prevent imminent or life-threatening deterioration. Critical care was time spent personally by me on the following activities: development of treatment plan with patient and/or surrogate as well as nursing, discussions with consultants, evaluation of patient's response to treatment, examination of patient, obtaining history from patient or surrogate, ordering and performing treatments and interventions, ordering and review of laboratory studies,  ordering and review of radiographic studies, pulse oximetry and re-evaluation of patient's condition.   When patient was initially seen she was have mild chest discomfort. EKG shows LVH changes. Initial troponin was slightly elevated 0.08. On second exam patient states that her pain seemed to be getting a lot worse patient was very uncomfortable. Patient had a second EKG done that showed significant ST depression. Patient was seen by cardiology who felt like she needs to be treated in the Cath Lab immediately  Final Clinical Impressions(s) / ED Diagnoses   Final diagnoses:  None    New Prescriptions New Prescriptions   No medications on file     Bethann Berkshire, MD 12/10/16 [phone]

## 2016-12-10 NOTE — Progress Notes (Signed)
  Echocardiogram Echocardiogram Transesophageal has been performed.  Cindy Garrett, Cindy Garrett 12/10/2016, 7:47 PM

## 2016-12-10 NOTE — ED Triage Notes (Signed)
Patient complains of chest pain that started 2 weeks ago. Sates pain in center of chest and under left breast.

## 2016-12-10 NOTE — Consult Note (Signed)
[address]       [place] [address]             [phone]      Cardiothoracic Surgery Consultation  Reason for Consult: High grade left main and multi-vessel coronary disease with acute STEMI Referring Physician: Dr. Shelva Majestic  Fayne Mcguffee is an 51 y.o. female.  HPI:   The patient is 51 year old obese smoker with a history of stroke last fall who presented to [facility] ER today complaining of 2 weeks of SSCP. Pain worsened in the ER and she developed ST depression inferiorly and troponin 0.08. Pain improved with SL NTG but still 3/10 and pt transferred to [facility] for emergent cath. This showed 95% ostial LM with pressure dampening with engagement. Large LAD and LCX. The proximal LAD has some segmental stenosis. The RCA is a moderate-sized vessel with an ostial stenosis that also damped with engagement. LVEF preserved. She is stable without chest pain at this time.  Past Medical History:  Diagnosis Date  . Stroke [facility])     Past Surgical History:  Procedure Laterality Date  . COLONOSCOPY N/A 05/26/2015   Procedure: COLONOSCOPY;  Surgeon: Danie Binder, MD;  Location: [facility] ENDO SUITE;  Service: Endoscopy;  Laterality: N/A;  2:00  . TUBAL LIGATION      Family History  Problem Relation Age of Onset  . Diabetes Father     Social History:  reports that she has been smoking Cigarettes.  She has a 30.00 pack-year smoking history. She has never used smokeless tobacco. She reports that she drinks alcohol. She reports that she does not use drugs.  Allergies:  Allergies  Allergen Reactions  . Penicillins Itching and Rash        Medications:  I have reviewed the patient's current medications. Prior to Admission:  No prescriptions prior to admission.   Scheduled: . [MAR Hold] aspirin  324 mg Oral Once  . [START ON 12/11/2016] cefUROXime (ZINACEF)  IV  1.5 g Intravenous To OR  . [START ON 12/11/2016] cefUROXime (ZINACEF)  IV  750 mg Intravenous To OR  . [START ON  12/11/2016] dexmedetomidine  0.1-0.7 mcg/kg/hr Intravenous To OR  . [START ON 12/11/2016] DOPamine  0-10 mcg/kg/min Intravenous To OR  . [START ON 12/11/2016] epinephrine  0-10 mcg/min Intravenous To OR  . [START ON 12/11/2016] heparin-papaverine-plasmalyte irrigation   Irrigation To OR  . [START ON 12/11/2016] heparin 30,000 units/NS 1000 mL solution for CELLSAVER   Other To OR  . [START ON 12/11/2016] insulin (NOVOLIN-R) infusion   Intravenous To OR  . [START ON 12/11/2016] magnesium sulfate  40 mEq Other To OR  . [START ON 12/11/2016] nitroGLYCERIN  2-200 mcg/min Intravenous To OR  . [START ON 12/11/2016] phenylephrine 41m/250mL NS (0.078mml) infusion  30-200 mcg/min Intravenous To OR  . [START ON 12/11/2016] potassium chloride  80 mEq Other To OR  . [START ON 12/11/2016] tranexamic acid (CYKLOKAPRON) infusion ([facility])  1.5 mg/kg/hr Intravenous To OR  . [START ON 12/11/2016] tranexamic acid  15 mg/kg Intravenous To OR  . [START ON 12/11/2016] tranexamic acid  2 mg/kg Intracatheter To OR  . [START ON 12/11/2016] vancomycin  1,500 mg Intravenous To OR   Continuous: . sodium chloride 20 mL/hr (12/10/16 1626)  . heparin    . [MAR Hold] nitroGLYCERIN 5 mcg/min (12/10/16 1628)   PRN:  Results for orders placed or performed during the hospital encounter of 12/10/16 (from the past 48 hour(s))  Basic metabolic panel     Status: Abnormal   Collection Time: 12/10/16  2:35 PM  Result Value Ref Range   Sodium 140 135 - 145 mmol/L   Potassium 4.3 3.5 - 5.1 mmol/L   Chloride 105 101 - 111 mmol/L   CO2 27 22 - 32 mmol/L   Glucose, Bld 122 (H) 65 - 99 mg/dL   BUN 11 6 - 20 mg/dL   Creatinine, Ser 0.72 0.44 - 1.00 mg/dL   Calcium 9.2 8.9 - 10.3 mg/dL   GFR calc non Af Amer >60 >60 mL/min   GFR calc Af Amer >60 >60 mL/min    Comment: (NOTE) The eGFR has been calculated using the CKD EPI equation. This calculation has not been validated in all clinical situations. eGFR's persistently <60 mL/min signify possible Chronic  Kidney Disease.    Anion gap 8 5 - 15  CBC     Status: None   Collection Time: 12/10/16  2:35 PM  Result Value Ref Range   WBC 6.8 4.0 - 10.5 K/uL   RBC 4.86 3.87 - 5.11 MIL/uL   Hemoglobin 14.5 12.0 - 15.0 g/dL   HCT 42.5 36.0 - 46.0 %   MCV 87.4 78.0 - 100.0 fL   MCH 29.8 26.0 - 34.0 pg   MCHC 34.1 30.0 - 36.0 g/dL   RDW 12.4 11.5 - 15.5 %   Platelets 390 150 - 400 K/uL  Troponin I     Status: Abnormal   Collection Time: 12/10/16  2:35 PM  Result Value Ref Range   Troponin I 0.08 (HH) <0.03 ng/mL    Comment: CRITICAL RESULT CALLED TO, READ BACK BY AND VERIFIED WITH: CREWS. M AT 1532 ON 12/10/2016 BY [email or web address]   D-dimer, quantitative (not at [facility])     Status: None   Collection Time: 12/10/16  2:35 PM  Result Value Ref Range   D-Dimer, Quant <0.27 0.00 - 0.50 ug/mL-FEU    Comment: (NOTE) At the manufacturer cut-off of 0.50 ug/mL FEU, this assay has been documented to exclude PE with a sensitivity and negative predictive value of 97 to 99%.  At this time, this assay has not been approved by the FDA to exclude DVT/VTE. Results should be correlated with clinical presentation.   Hepatic function panel     Status: None   Collection Time: 12/10/16  2:35 PM  Result Value Ref Range   Total Protein 7.7 6.5 - 8.1 g/dL   Albumin 4.3 3.5 - 5.0 g/dL   AST 18 15 - 41 U/L   ALT 15 14 - 54 U/L   Alkaline Phosphatase 97 38 - 126 U/L   Total Bilirubin 0.4 0.3 - 1.2 mg/dL   Bilirubin, Direct 0.1 0.1 - 0.5 mg/dL   Indirect Bilirubin 0.3 0.3 - 0.9 mg/dL  Lipase, blood     Status: None   Collection Time: 12/10/16  2:35 PM  Result Value Ref Range   Lipase 13 11 - 51 U/L  Type and screen Pre-op diagnosis: Urgent, patient to OR for CABG     Status: None (Preliminary result)   Collection Time: 12/10/16  6:11 PM  Result Value Ref Range   ABO/RH(D) O POS    Antibody Screen PENDING    Sample Expiration 12/13/2016     Dg Chest 2 View  Result Date: 12/10/2016 CLINICAL DATA:  Chest/ abdominal  pain, asthma, smoker EXAM: CHEST  2 VIEW COMPARISON:  07/14/2016 FINDINGS: Lungs are clear.  No pleural effusion or pneumothorax. The  heart is normal in size. Visualized osseous structures are within normal limits. IMPRESSION: Normal chest radiographs. Electronically Signed   By: Julian Hy M.D.   On: 12/10/2016 14:56    Review of Systems  Unable to perform ROS: Acuity of condition   Blood pressure (!) 154/87, pulse 77, temperature 97.7 F (36.5 C), temperature source Temporal, resp. rate 12, height '5\' 2"'  (1.575 m), weight 86.2 kg (190 lb), last menstrual period 08/03/2013, SpO2 (!) 0 %. Physical Exam  Constitutional: She is oriented to person, place, and time.  Obese woman in no distress  Cardiovascular: Normal rate, regular rhythm and normal heart sounds.   No murmur heard. Respiratory: Effort normal and breath sounds normal. No respiratory distress. She has no wheezes. She has no rales.  GI: Soft. Bowel sounds are normal. She exhibits no distension. There is no tenderness.  Neurological: She is alert and oriented to person, place, and time.  Skin: Skin is warm and dry.  Psychiatric: She has a normal mood and affect.    Assessment/Plan:  This 51 year old woman has high grade ostial LM and multi-vessel CAD presenting with acute MI. ECG changes have resolved and she is pain free. She requires emergent CABG due to critical anatomy. I discussed the operative procedure with the patient including alternatives, benefits and risks; including but not limited to bleeding, blood transfusion, infection, stroke, myocardial infarction, graft failure, heart block requiring a permanent pacemaker, organ dysfunction, and death.  Alcide Goodness understands and agrees to proceed.    Gaye Pollack 12/10/2016, 6:42 PM

## 2016-12-10 NOTE — Brief Op Note (Signed)
12/10/2016  10:00 PM  PATIENT:  Cindy Garrett  51 y.o. female  PRE-OPERATIVE DIAGNOSIS:  Left Main  POST-OPERATIVE DIAGNOSIS:  Left Main  PROCEDURE:  Procedure(s): CORONARY ARTERY BYPASS GRAFTING (CABG) times three using the left internal mammary artery and the right greater saphenous vein (N/A) TRANSESOPHAGEAL ECHOCARDIOGRAM (TEE) (N/A) LIMA-LAD SVG-OM SVG-RCA  SURGEON:  Surgeon(s) and Role:    * Alleen BorneBryan K Bartle, MD - Primary  PHYSICIAN ASSISTANT: WAYNE GOLD PA-C  ANESTHESIA:   general  EBL:  Total I/O In: 1000 [I.V.:1000] Out: 100 [Urine:100]  BLOOD ADMINISTERED:none  DRAINS: 3 CHEST TUBES   LOCAL MEDICATIONS USED:  NONE  SPECIMEN:  No Specimen  DISPOSITION OF SPECIMEN:  N/A  COUNTS:  YES  TOURNIQUET:  * No tourniquets in log *  DICTATION: .Dragon Dictation  PLAN OF CARE: Admit to inpatient   PATIENT DISPOSITION:  ICU - intubated and hemodynamically stable.   Delay start of Pharmacological VTE agent (>24hrs) due to surgical blood loss or risk of bleeding: yes  COMPLICATIONS: NO KNOWN

## 2016-12-10 NOTE — ED Notes (Signed)
[facility]

## 2016-12-10 NOTE — Anesthesia Postprocedure Evaluation (Signed)
Anesthesia Post Note  Patient: Cindy SabalFrances Padgett  Procedure(s) Performed: Procedure(s) (LRB): CORONARY ARTERY BYPASS GRAFTING (CABG) times three using the left internal mammary artery and the right greater saphenous vein (N/A) TRANSESOPHAGEAL ECHOCARDIOGRAM (TEE) (N/A)  Patient location during evaluation: SICU Anesthesia Type: General Level of consciousness: patient remains intubated per anesthesia plan Pain management: pain level controlled Vital Signs Assessment: post-procedure vital signs reviewed and stable Respiratory status: patient remains intubated per anesthesia plan Cardiovascular status: stable Anesthetic complications: no       Last Vitals:  Vitals:   12/10/16 1832 12/10/16 1837  BP: 134/85 (!) 154/87  Pulse: 74 77  Resp: 14 12  Temp:      Last Pain:  Vitals:   12/10/16 1507  TempSrc:   PainSc: 9                  Taveon Enyeart

## 2016-12-10 NOTE — ED Notes (Signed)
Patient states history of stroke a few months ago.

## 2016-12-10 NOTE — Consult Note (Signed)
CARDIOLOGY CONSULT NOTE       Patient ID: Cindy Garrett MRN: [id number] DOB/AGE: 1966-09-20 51 y.o.  Admit date: 12/10/2016 Referring Physician: Estell Harpin Primary Physician: Josue Hector, MD Primary Cardiologist: New Reason for Consultation: Chest Pain  Active Problems:   * No active hospital problems. *   HPI:  51 y.o. came to [facility] ER complaining of 2 weeks SSCP. Thought it was indigestion but not getting better with antacids. Son brought her to ER concerned. Pain in center of chest not always related to exertion Per ER doctor some pain to palpation. When I saw patient pain got a lot worse Repeat ECG showed 2 mm ST segment depression in inferior lateral leads new from initial ER ECG and initial troponin .08. Nurses trying to get better iv access. Some relief with SL nitro but pain still 3/10.  Discussed with patient and son need to transfer [facility] for urgent cath. Cath lab called and arrangements made via [facility]. ECG;s sent to on call interventionalist Dr Eldridge Dace   She was seen in September for sensory stroke told to be on aspirin MRI with lacunar infarct Should not be contra indication to DAT or intervention   ROS All other systems reviewed and negative except as noted above  Past Medical History:  Diagnosis Date  . Stroke [facility])     Family History  Problem Relation Age of Onset  . Diabetes Father     Social History   Social History  . Marital status: Single    Spouse name: N/A  . Number of children: N/A  . Years of education: N/A   Occupational History  . Not on file.   Social History Main Topics  . Smoking status: Current Every Day Smoker    Packs/day: 1.00    Years: 30.00    Types: Cigarettes  . Smokeless tobacco: Never Used  . Alcohol use Yes     Comment: rarely  . Drug use: No  . Sexual activity: Yes    Birth control/ protection: Surgical   Other Topics Concern  . Not on file   Social History Narrative  . No narrative on file    Past Surgical  History:  Procedure Laterality Date  . COLONOSCOPY N/A 05/26/2015   Procedure: COLONOSCOPY;  Surgeon: West Bali, MD;  Location: [facility] ENDO SUITE;  Service: Endoscopy;  Laterality: N/A;  2:00  . TUBAL LIGATION       . aspirin  324 mg Oral Once   . sodium chloride 20 mL/hr (12/10/16 1626)  . nitroGLYCERIN 5 mcg/min (12/10/16 1628)    Physical Exam: Blood pressure 145/98, pulse 84, temperature 97.7 F (36.5 C), temperature source Temporal, resp. rate 13, height 5\' 2"  (1.575 m), weight 190 lb (86.2 kg), last menstrual period 08/03/2013, SpO2 100 %.    Moderate distress  Obese black female  HEENT: normal Neck supple with no adenopathy JVP normal no bruits no thyromegaly Lungs clear with no wheezing and good diaphragmatic motion Heart:  S1/S2 no murmur, no rub, gallop or click PMI normal Abdomen: benighn, BS positve, no tenderness, no AAA no bruit.  No HSM or HJR Distal pulses intact with no bruits No edema Neuro non-focal Skin warm and dry No muscular weakness   Labs:   Lab Results  Component Value Date   WBC 6.8 12/10/2016   HGB 14.5 12/10/2016   HCT 42.5 12/10/2016   MCV 87.4 12/10/2016   PLT 390 12/10/2016     Recent Labs Lab 12/10/16 1435  NA 140  K 4.3  CL 105  CO2 27  BUN 11  CREATININE 0.72  CALCIUM 9.2  PROT 7.7  BILITOT 0.4  ALKPHOS 97  ALT 15  AST 18  GLUCOSE 122*   Lab Results  Component Value Date   TROPONINI 0.08 (HH) 12/10/2016   No results found for: CHOL No results found for: HDL No results found for: LDLCALC No results found for: TRIG No results found for: CHOLHDL No results found for: LDLDIRECT    Radiology: Dg Chest 2 View  Result Date: 12/10/2016 CLINICAL DATA:  Chest/ abdominal pain, asthma, smoker EXAM: CHEST  2 VIEW COMPARISON:  07/14/2016 FINDINGS: Lungs are clear.  No pleural effusion or pneumothorax. The heart is normal in size. Visualized osseous structures are within normal limits. IMPRESSION: Normal chest radiographs.  Electronically Signed   By: Charline BillsSriyesh  Krishnan M.D.   On: 12/10/2016 14:56    EKG: Initially SR LVH with increased pain SR with 2 mm ST depression in inferior lateral leads. ST elevation AVR   ASSESSMENT AND PLAN:  Chest Pain: Concern for LM disease with diffuse ST depression. Start iv nitro and heparin. See above [facility] discussed risks with patient and son including death, stroke, bleeding and need for emergency CABG willing to proceed. Got ASA in ER Will not load with Brilinta here as ECG suspicious for possible surgical dx  Stroke:  07/14/16 lacunar with sensory signs not taking ASA should not be contraindication to anticoagulation.   Smoking: needs to quit discussed issues of nicotine and stroke/MI.     GERD: would cover with iv protonix during stay then change to PO  Low carb diet and weight loss   Signed: Charlton Hawseter Bria Portales 12/10/2016, 4:43 PM

## 2016-12-10 NOTE — Op Note (Signed)
CARDIOVASCULAR SURGERY OPERATIVE NOTE  12/10/2016  Surgeon:  Alleen BorneBryan K. Bartle, MD  First Assistant: Gershon CraneWayne Gold, PA-C   Preoperative Diagnosis:  Severe left main and multi-vessel coronary artery disease   Postoperative Diagnosis:  Same   Procedure: Emergent from cath lab  1. Median Sternotomy 2. Extracorporeal circulation 3.   Coronary artery bypass grafting x 3   Left internal mammary graft to the LAD  SVG to OM  SVG to RCA  4.   Endoscopic vein harvest from the right leg   Anesthesia:  General Endotracheal   Clinical History/Surgical Indication:  The patient is 51 year old obese smoker with a history of stroke last fall who presented to [facility] ER today complaining of 2 weeks of SSCP. Pain worsened in the ER and she developed ST depression inferiorly and troponin 0.08. Pain improved with SL NTG but still 3/10 and [facility]. This showed 95% ostial LM with pressure dampening with engagement. Large LAD and LCX. The proximal LAD has some segmental stenosis. The RCA is a moderate-sized vessel with an ostial stenosis that also damped with engagement. LVEF preserved. She is stable without chest pain at this time.  She has high grade ostial LM and multi-vessel CAD presenting with acute MI. ECG changes have resolved and she is pain free. She requires emergent CABG due to critical anatomy. I discussed the operative procedure with the patient including alternatives, benefits and risks; including but not limited to bleeding, blood transfusion, infection, stroke, myocardial infarction, graft failure, heart block requiring a permanent pacemaker, organ dysfunction, and death.  Lawerance SabalFrances Schuenemann understands and agrees to proceed.     Preparation:  The patient was seen in the preoperative holding area and the correct patient, correct operation were confirmed with the patient  after reviewing the medical record and catheterization. The consent was signed by me. Preoperative antibiotics were given. A pulmonary arterial line and radial arterial line were placed by the anesthesia team. The patient was taken back to the operating room and positioned supine on the operating room table. After being placed under general endotracheal anesthesia by the anesthesia team a foley catheter was placed. The neck, chest, abdomen, and both legs were prepped with betadine soap and solution and draped in the usual sterile manner. A surgical time-out was taken and the correct patient and operative procedure were confirmed with the nursing and anesthesia staff.   TEE: performed by Dr. Dorris Singhharlene Green. This showed normal LV function, no MR.   Cardiopulmonary Bypass:  A median sternotomy was performed. The pericardium was opened in the midline. Right ventricular function appeared normal. The ascending aorta was of normal size and had no palpable plaque. There were no contraindications to aortic cannulation or cross-clamping. The patient was fully systemically heparinized and the ACT was maintained > 400 sec. The proximal aortic arch was cannulated with a 20 F aortic cannula for arterial inflow. Venous cannulation was performed via the right atrial appendage using a two-staged venous cannula. An antegrade cardioplegia/vent cannula was inserted into the mid-ascending aorta. Aortic occlusion was performed with a single cross-clamp. Systemic cooling to 32 degrees Centigrade and topical cooling of the heart with iced saline were used. Hyperkalemic antegrade cold blood cardioplegia was used to induce diastolic arrest and was then given at about 20 minute intervals throughout the period of arrest to maintain myocardial temperature at or below 10 degrees centigrade. A temperature probe was inserted into the interventricular septum and an insulating pad was placed in  the pericardium.   Left internal mammary  harvest:  The left side of the sternum was retracted using the Rultract retractor. The left internal mammary artery was harvested as a pedicle graft. All side branches were clipped. It was a small to medium-sized vessel of good quality with good blood flow. It was ligated distally and divided. It was sprayed with topical papaverine solution to prevent vasospasm.   Endoscopic vein harvest:  The right greater saphenous vein was harvested endoscopically through a 2 cm incision medial to the right knee. It was harvested from the upper thigh to below the knee. It was a medium-sized vein of good quality. The side branches were all ligated with 4-0 silk ties.    Coronary arteries:  The coronary arteries were examined.   LAD:  Large vessel that was diffusely diseased  LCX:  Large OM that was diffusely diseased  RCA:  Large vessel proximally in AV groove where it was graftable. Distally it was small   Grafts:  1. LIMA to the LAD: 2.0 mm. It was sewn end to side using 8-0 prolene continuous suture. 2. SVG to OM:  2.0 mm. It was sewn end to side using 7-0 prolene continuous suture. 3. SVG to RCA:  2.5 mm. It was sewn end to side using 7-0 prolene continuous suture.   The proximal vein graft anastomoses were performed to the mid-ascending aorta using continuous 6-0 prolene suture. Graft markers were placed around the proximal anastomoses.   Completion:  The patient was rewarmed to 37 degrees Centigrade. The clamp was removed from the LIMA pedicle and there was rapid warming of the septum and return of ventricular fibrillation. The crossclamp was removed with a time of 69 minutes. There was spontaneous return of sinus rhythm. The distal and proximal anastomoses were checked for hemostasis. The position of the grafts was satisfactory. Two temporary epicardial pacing wires were placed on the right atrium and two on the right ventricle. The patient was weaned from CPB without difficulty on no  inotropes. CPB time was 85 minutes. Cardiac output was 4 LPM. TEE showed normal LV function. Heparin was fully reversed with protamine and the aortic and venous cannulas removed. Hemostasis was achieved. Mediastinal and left pleural drainage tubes were placed. The sternum was closed with double #6 stainless steel wires. The fascia was closed with continuous # 1 vicryl suture. The subcutaneous tissue was closed with 2-0 vicryl continuous suture. The skin was closed with 3-0 vicryl subcuticular suture. All sponge, needle, and instrument counts were reported correct at the end of the case. Dry sterile dressings were placed over the incisions and around the chest tubes which were connected to pleurevac suction. The patient was then transported to the surgical intensive care unit in critical but stable condition.

## 2016-12-10 NOTE — Transfer of Care (Signed)
Immediate Anesthesia Transfer of Care Note  Patient: Cindy SabalFrances Garrett  Procedure(s) Performed: Procedure(s): CORONARY ARTERY BYPASS GRAFTING (CABG) times three using the left internal mammary artery and the right greater saphenous vein (N/A) TRANSESOPHAGEAL ECHOCARDIOGRAM (TEE) (N/A)  Patient Location: SICU  Anesthesia Type:General  Level of Consciousness: Patient remains intubated per anesthesia plan  Airway & Oxygen Therapy: Patient remains intubated per anesthesia plan and Patient placed on Ventilator (see vital sign flow sheet for setting)  Post-op Assessment: Report given to RN and Post -op Vital signs reviewed and stable  Post vital signs: Reviewed and stable  Last Vitals:  Vitals:   12/10/16 1832 12/10/16 1837  BP: 134/85 (!) 154/87  Pulse: 74 77  Resp: 14 12  Temp:      Last Pain:  Vitals:   12/10/16 1507  TempSrc:   PainSc: 9          Complications: No apparent anesthesia complications

## 2016-12-11 ENCOUNTER — Encounter (HOSPITAL_COMMUNITY): Payer: Self-pay | Admitting: Cardiovascular Disease

## 2016-12-11 ENCOUNTER — Inpatient Hospital Stay (HOSPITAL_COMMUNITY): Payer: BLUE CROSS/BLUE SHIELD

## 2016-12-11 DIAGNOSIS — I249 Acute ischemic heart disease, unspecified: Secondary | ICD-10-CM

## 2016-12-11 DIAGNOSIS — Z951 Presence of aortocoronary bypass graft: Secondary | ICD-10-CM

## 2016-12-11 LAB — CBC
HCT: 30.3 % — ABNORMAL LOW (ref 36.0–46.0)
HCT: 33.8 % — ABNORMAL LOW (ref 36.0–46.0)
HEMATOCRIT: 30.2 % — AB (ref 36.0–46.0)
HEMOGLOBIN: 10.4 g/dL — AB (ref 12.0–15.0)
HEMOGLOBIN: 10.4 g/dL — AB (ref 12.0–15.0)
HEMOGLOBIN: 11.5 g/dL — AB (ref 12.0–15.0)
MCH: 29.6 pg (ref 26.0–34.0)
MCH: 29.6 pg (ref 26.0–34.0)
MCH: 30 pg (ref 26.0–34.0)
MCHC: 34 g/dL (ref 30.0–36.0)
MCHC: 34.3 g/dL (ref 30.0–36.0)
MCHC: 34.4 g/dL (ref 30.0–36.0)
MCV: 86 fL (ref 78.0–100.0)
MCV: 87.1 fL (ref 78.0–100.0)
MCV: 87.3 fL (ref 78.0–100.0)
PLATELETS: 217 10*3/uL (ref 150–400)
PLATELETS: 247 10*3/uL (ref 150–400)
Platelets: 216 10*3/uL (ref 150–400)
RBC: 3.47 MIL/uL — AB (ref 3.87–5.11)
RBC: 3.51 MIL/uL — AB (ref 3.87–5.11)
RBC: 3.88 MIL/uL (ref 3.87–5.11)
RDW: 12.4 % (ref 11.5–15.5)
RDW: 12.8 % (ref 11.5–15.5)
RDW: 12.9 % (ref 11.5–15.5)
WBC: 10.7 10*3/uL — ABNORMAL HIGH (ref 4.0–10.5)
WBC: 11.1 10*3/uL — ABNORMAL HIGH (ref 4.0–10.5)
WBC: 12.2 10*3/uL — AB (ref 4.0–10.5)

## 2016-12-11 LAB — BASIC METABOLIC PANEL
ANION GAP: 7 (ref 5–15)
BUN: 7 mg/dL (ref 6–20)
CHLORIDE: 107 mmol/L (ref 101–111)
CO2: 24 mmol/L (ref 22–32)
Calcium: 7.5 mg/dL — ABNORMAL LOW (ref 8.9–10.3)
Creatinine, Ser: 0.69 mg/dL (ref 0.44–1.00)
GFR calc Af Amer: >60 mL/min (ref >60)
GLUCOSE: 109 mg/dL — AB (ref 65–99)
POTASSIUM: 4.7 mmol/L (ref 3.5–5.1)
Sodium: 138 mmol/L (ref 135–145)

## 2016-12-11 LAB — POCT I-STAT 3, ART BLOOD GAS (G3+)
Acid-base deficit: 3 mmol/L — ABNORMAL HIGH (ref 0.0–2.0)
Acid-base deficit: 4 mmol/L — ABNORMAL HIGH (ref 0.0–2.0)
Acid-base deficit: 1 mmol/L (ref 0.0–2.0)
Acid-base deficit: 3 mmol/L — ABNORMAL HIGH (ref 0.0–2.0)
BICARBONATE: 24.3 mmol/L (ref 20.0–28.0)
BICARBONATE: 25.4 mmol/L (ref 20.0–28.0)
BICARBONATE: 23.3 mmol/L (ref 20.0–28.0)
Bicarbonate: 22.7 mmol/L (ref 20.0–28.0)
O2 SAT: 97 %
O2 SAT: 99 %
O2 Saturation: 96 %
O2 Saturation: 100 %
PCO2 ART: 46.6 mmHg (ref 32.0–48.0)
PCO2 ART: 52 mmHg — AB (ref 32.0–48.0)
PCO2 ART: 48.5 mmHg — AB (ref 32.0–48.0)
PCO2 ART: 46.4 mmHg (ref 32.0–48.0)
PH ART: 7.275 — AB (ref 7.350–7.450)
PH ART: 7.295 — AB (ref 7.350–7.450)
PO2 ART: 246 mmHg — AB (ref 83.0–108.0)
PO2 ART: 92 mmHg (ref 83.0–108.0)
PO2 ART: 92 mmHg (ref 83.0–108.0)
Patient temperature: 36.6
Patient temperature: 36.5
Patient temperature: 36.3
TCO2: 24 mmol/L (ref 0–100)
TCO2: 26 mmol/L (ref 0–100)
TCO2: 27 mmol/L (ref 0–100)
TCO2: 25 mmol/L (ref 0–100)
pH, Arterial: 7.324 — ABNORMAL LOW (ref 7.350–7.450)
pH, Arterial: 7.306 — ABNORMAL LOW (ref 7.350–7.450)
pO2, Arterial: 148 mmHg — ABNORMAL HIGH (ref 83.0–108.0)

## 2016-12-11 LAB — GLUCOSE, CAPILLARY
GLUCOSE-CAPILLARY: 110 mg/dL — AB (ref 65–99)
GLUCOSE-CAPILLARY: 118 mg/dL — AB (ref 65–99)
GLUCOSE-CAPILLARY: 120 mg/dL — AB (ref 65–99)
GLUCOSE-CAPILLARY: 122 mg/dL — AB (ref 65–99)
GLUCOSE-CAPILLARY: 127 mg/dL — AB (ref 65–99)
GLUCOSE-CAPILLARY: 135 mg/dL — AB (ref 65–99)
GLUCOSE-CAPILLARY: 98 mg/dL (ref 65–99)
Glucose-Capillary: 115 mg/dL — ABNORMAL HIGH (ref 65–99)
Glucose-Capillary: 118 mg/dL — ABNORMAL HIGH (ref 65–99)
Glucose-Capillary: 119 mg/dL — ABNORMAL HIGH (ref 65–99)
Glucose-Capillary: 131 mg/dL — ABNORMAL HIGH (ref 65–99)

## 2016-12-11 LAB — POCT I-STAT, CHEM 8
BUN: 5 mg/dL — ABNORMAL LOW (ref 6–20)
CHLORIDE: 100 mmol/L — AB (ref 101–111)
Calcium, Ion: 1.15 mmol/L (ref 1.15–1.40)
Creatinine, Ser: 0.6 mg/dL (ref 0.44–1.00)
Glucose, Bld: 136 mg/dL — ABNORMAL HIGH (ref 65–99)
HCT: 34 % — ABNORMAL LOW (ref 36.0–46.0)
Hemoglobin: 11.6 g/dL — ABNORMAL LOW (ref 12.0–15.0)
POTASSIUM: 4.2 mmol/L (ref 3.5–5.1)
SODIUM: 137 mmol/L (ref 135–145)
TCO2: 27 mmol/L (ref 0–100)

## 2016-12-11 LAB — DIFFERENTIAL
BASOS PCT: 0 %
Basophils Absolute: 0 10*3/uL (ref 0.0–0.1)
EOS ABS: 0.3 10*3/uL (ref 0.0–0.7)
Eosinophils Relative: 4 %
Lymphocytes Relative: 30 %
Lymphs Abs: 2.1 10*3/uL (ref 0.7–4.0)
Monocytes Absolute: 0.5 10*3/uL (ref 0.1–1.0)
Monocytes Relative: 7 %
NEUTROS ABS: 4.1 10*3/uL (ref 1.7–7.7)
NEUTROS PCT: 59 %

## 2016-12-11 LAB — PROTIME-INR
INR: 1.44
PROTHROMBIN TIME: 17.7 s — AB (ref 11.4–15.2)

## 2016-12-11 LAB — APTT: aPTT: 31 seconds (ref 24–36)

## 2016-12-11 LAB — MAGNESIUM
MAGNESIUM: 3 mg/dL — AB (ref 1.7–2.4)
Magnesium: 2.2 mg/dL (ref 1.7–2.4)

## 2016-12-11 LAB — CREATININE, SERUM
CREATININE: 0.62 mg/dL (ref 0.44–1.00)
GFR calc non Af Amer: >60 mL/min (ref >60)

## 2016-12-11 MED ORDER — SODIUM CHLORIDE 0.9 % IV SOLN
Freq: Once | INTRAVENOUS | Status: AC
  Administered 2016-12-11: via INTRAVENOUS
  Filled 2016-12-11: qty 250

## 2016-12-11 MED ORDER — FUROSEMIDE 10 MG/ML IJ SOLN
40.0000 mg | Freq: Once | INTRAMUSCULAR | Status: AC
  Administered 2016-12-11: 40 mg via INTRAVENOUS
  Filled 2016-12-11: qty 4

## 2016-12-11 MED ORDER — ENOXAPARIN SODIUM 40 MG/0.4ML ~~LOC~~ SOLN
40.0000 mg | Freq: Every day | SUBCUTANEOUS | Status: DC
  Administered 2016-12-11 – 2016-12-13 (×3): 40 mg via SUBCUTANEOUS
  Filled 2016-12-11 (×3): qty 0.4

## 2016-12-11 MED ORDER — ATORVASTATIN CALCIUM 80 MG PO TABS
80.0000 mg | ORAL_TABLET | Freq: Every day | ORAL | Status: DC
  Administered 2016-12-11 – 2016-12-13 (×3): 80 mg via ORAL
  Filled 2016-12-11 (×4): qty 1

## 2016-12-11 MED ORDER — INSULIN ASPART 100 UNIT/ML ~~LOC~~ SOLN
0.0000 [IU] | SUBCUTANEOUS | Status: DC
  Administered 2016-12-11: 2 [IU] via SUBCUTANEOUS

## 2016-12-11 MED ORDER — INSULIN ASPART 100 UNIT/ML ~~LOC~~ SOLN
0.0000 [IU] | SUBCUTANEOUS | Status: DC
  Administered 2016-12-11 – 2016-12-12 (×3): 2 [IU] via SUBCUTANEOUS

## 2016-12-11 MED FILL — Heparin Sodium (Porcine) Inj 1000 Unit/ML: INTRAMUSCULAR | Qty: 30 | Status: AC

## 2016-12-11 MED FILL — Verapamil HCl IV Soln 2.5 MG/ML: INTRAVENOUS | Qty: 2 | Status: AC

## 2016-12-11 MED FILL — Potassium Chloride Inj 2 mEq/ML: INTRAVENOUS | Qty: 40 | Status: AC

## 2016-12-11 MED FILL — Magnesium Sulfate Inj 50%: INTRAMUSCULAR | Qty: 10 | Status: AC

## 2016-12-11 MED FILL — Nitroglycerin IV Soln 100 MCG/ML in D5W: INTRA_ARTERIAL | Qty: 10 | Status: AC

## 2016-12-11 NOTE — Progress Notes (Signed)
Pts right femoral sheath d/c'd. Manual pressure held for 20 minutes, Devota PaceKaylee Quick, RN assisted. VS stable throughout along with palpable pulses. Site level 0. Pt education given to pts RN d/t pt sedated and intubated.

## 2016-12-11 NOTE — Progress Notes (Signed)
Progress Note  Patient Name: Cindy Garrett Date of Encounter: 12/11/2016  Primary Cardiologist: new  Subjective   Day 1 s/p emergent CABG for sever LM and multivessel CAD.  Alert; extubated this am.  Inpatient Medications    Scheduled Meds: . acetaminophen  1,000 mg Oral Q6H   Or  . acetaminophen (TYLENOL) oral liquid 160 mg/5 mL  1,000 mg Per Tube Q6H  . aspirin EC  325 mg Oral Daily   Or  . aspirin  324 mg Per Tube Daily  . bisacodyl  10 mg Oral Daily   Or  . bisacodyl  10 mg Rectal Daily  . docusate sodium  200 mg Oral Daily  . enoxaparin (LOVENOX) injection  40 mg Subcutaneous QHS  . famotidine (PEPCID) IV  20 mg Intravenous Q12H  . insulin aspart  0-24 Units Subcutaneous Q4H  . levofloxacin (LEVAQUIN) IV  750 mg Intravenous Q24H  . metoprolol tartrate  12.5 mg Oral BID   Or  . metoprolol tartrate  12.5 mg Per Tube BID  . [START ON 12/12/2016] pantoprazole  40 mg Oral Daily  . sodium chloride flush  3 mL Intravenous Q12H   Continuous Infusions: . sodium chloride 20 mL/hr at 12/11/16 0000  . sodium chloride    . sodium chloride 20 mL/hr at 12/11/16 0036  . lactated ringers 20 mL/hr at 12/11/16 0000  . lactated ringers 20 mL/hr at 12/10/16 2345  . nitroGLYCERIN    . phenylephrine (NEO-SYNEPHRINE) Adult infusion     PRN Meds: sodium chloride, albumin human, lactated ringers, metoprolol, morphine injection, morphine injection, ondansetron (ZOFRAN) IV, oxyCODONE, sodium chloride flush, traMADol   Vital Signs    Vitals:   12/11/16 0700 12/11/16 0730 12/11/16 0800 12/11/16 0900  BP: 97/68  93/66 126/77  Pulse: 77 78 80 80  Resp: 13 15 11  (!) 9  Temp: 97.5 F (36.4 C) 97.5 F (36.4 C) 97.3 F (36.3 C)   TempSrc:      SpO2: 100% 100% 100% 100%  Weight:      Height:        Intake/Output Summary (Last 24 hours) at 12/11/16 1000 Last data filed at 12/11/16 6295  Gross per 24 hour  Intake          4386.84 ml  Output             2695 ml  Net          1691.84  ml    I/O since admission:   Filed Weights   12/10/16 1408 12/11/16 0500  Weight: 190 lb (86.2 kg) 198 lb 13.7 oz (90.2 kg)    Telemetry    NSR at 83- Personally Reviewed  ECG    ECG (independently read by me): 12/10/16 showed NSR with diffuse ST depression and 2 mm ST elevation aVR suggestive of LM disease and f/u tracing when pain free showed resolution of changes.  Will repeat ECG today  Physical Exam    Physical Exam BP 126/77   Pulse 80   Temp 97.3 F (36.3 C)   Resp (!) 9   Ht 5\' 2"  (1.575 m)   Wt 198 lb 13.7 oz (90.2 kg)   LMP 08/03/2013   SpO2 100%   BMI 36.37 kg/m  General: Alert, oriented, no distress.  Skin: normal turgor, no rashes, warm and dry HEENT: Normocephalic, atraumatic. Pupils equal round and reactive to light; sclera anicteric; extraocular muscles intact;  Nose without nasal septal hypertrophy Mouth/Parynx benign; Mallinpatti scale 3  Neck: No JVD, no carotid bruits; normal carotid upstroke Lungs: clear to ausculatation and percussion; no wheezing or rales Chest wall: without tenderness to palpitation Heart: PMI not displaced, RRR, s1 s2 normal, 1/6 systolic murmur, no diastolic murmur, no rubs, gallops, thrills, or heaves Abdomen: soft, nontender; no hepatosplenomehaly, BS+; abdominal aorta nontender and not dilated by palpation. Back: no CVA tenderness Pulses 2+; R groin cath site stable without hematoma Musculoskeletal: full range of motion, normal strength, no joint deformities Extremities: no clubbing cyanosis or edema, Homan's sign negative  Neurologic: grossly nonfocal; Cranial nerves grossly wnl Psychologic: Normal mood and affect    Labs    Chemistry Recent Labs Lab 12/10/16 1435  12/10/16 2149 12/10/16 2242 12/11/16 0452  NA 140  < > 139 137 138  K 4.3  < > 4.1 3.9 4.7  CL 105  < > 100* 100* 107  CO2 27  --   --   --  24  GLUCOSE 122*  < > 129* 119* 109*  BUN 11  < > 7 8 7   CREATININE 0.72  < > 0.50 0.50 0.69  CALCIUM  9.2  --   --   --  7.5*  PROT 7.7  --   --   --   --   ALBUMIN 4.3  --   --   --   --   AST 18  --   --   --   --   ALT 15  --   --   --   --   ALKPHOS 97  --   --   --   --   BILITOT 0.4  --   --   --   --   GFRNONAA >60  --   --   --  >60  GFRAA >60  --   --   --  >60  ANIONGAP 8  --   --   --  7  < > = values in this interval not displayed.   Hematology Recent Labs Lab 12/10/16 1435  12/10/16 2148  12/10/16 2242 12/10/16 2345 12/11/16 0452  WBC 6.8  --   --   --   --  10.7* 12.2*  RBC 4.86  --   --   --   --  3.51* 3.47*  HGB 14.5  < > 8.5*  < > 8.2* 10.4* 10.4*  HCT 42.5  < > 24.8*  < > 24.0* 30.2* 30.3*  MCV 87.4  --   --   --   --  86.0 87.3  MCH 29.8  --   --   --   --  29.6 30.0  MCHC 34.1  --   --   --   --  34.4 34.3  RDW 12.4  --   --   --   --  12.4 12.8  PLT 390  --  242  --   --  216 217  < > = values in this interval not displayed.  Cardiac Enzymes Recent Labs Lab 12/10/16 1435  TROPONINI 0.08*   No results for input(s): TROPIPOC in the last 168 hours.   BNPNo results for input(s): BNP, PROBNP in the last 168 hours.   DDimer  Recent Labs Lab 12/10/16 1435  DDIMER <0.27     Lipid Panel  No results found for: CHOL, TRIG, HDL, CHOLHDL, VLDL, LDLCALC, LDLDIRECT   Radiology    Dg Chest 2 View  Result Date: 12/10/2016 CLINICAL DATA:  Chest/ abdominal  pain, asthma, smoker EXAM: CHEST  2 VIEW COMPARISON:  07/14/2016 FINDINGS: Lungs are clear.  No pleural effusion or pneumothorax. The heart is normal in size. Visualized osseous structures are within normal limits. IMPRESSION: Normal chest radiographs. Electronically Signed   By: Charline Bills M.D.   On: 12/10/2016 14:56   Dg Chest Port 1 View  Result Date: 12/11/2016 CLINICAL DATA:  Recent coronary artery bypass grafting. Atelectatic change. EXAM: PORTABLE CHEST 1 VIEW COMPARISON:  December 10, 2016 FINDINGS: Endotracheal tube and nasogastric tube have been removed. Swan-Ganz catheter tip is in  proximal most aspect of the right main pulmonary artery. There is a mediastinal drain and left chest tube present, unchanged. No pneumothorax. There is slight left base atelectasis. Lungs elsewhere are clear. Heart is upper normal in size with pulmonary vascularity within normal limits. No adenopathy. IMPRESSION: Tube and catheter positions as described without pneumothorax. Mild left base atelectasis. Lungs elsewhere clear. Stable cardiac silhouette. Electronically Signed   By: Bretta Bang III M.D.   On: 12/11/2016 08:35   Dg Chest Port 1 View  Result Date: 12/11/2016 CLINICAL DATA:  Postoperative coronary artery bypass. EXAM: PORTABLE CHEST 1 VIEW COMPARISON:  12/10/2016 FINDINGS: Interval postoperative changes with median sternotomy wires and surgical clips in the mediastinum. An endotracheal tube is been placed. The tip measures about 11 mm above the carina. Right Swan-Ganz catheter with tip in the pulmonary outflow tract region. Mediastinal drains. Left chest tube. Shallow inspiration. Heart size and pulmonary vascularity are normal. Focal infiltration or atelectasis in the left upper lung. No pneumothorax. Degenerative changes in the spine. IMPRESSION: Appliances appear in satisfactory location. Atelectasis or focal consolidation in the left upper lung. Electronically Signed   By: Burman Nieves M.D.   On: 12/11/2016 00:03    Cardiac Studies   Acute coronary syndrome with greater than 95% ostial left main stenosis, 70% proximal LAD stenosis, normal left circumflex coronary artery, and 60% very proximal RCA stenosis in a small RCA vessel.  Preserved global LV function with an ejection fraction at 55% and only a small region of subtle mild upper mid anterolateral hypocontractility.  The patient's transient ECG changes were concordant with left main disease and diffuse global ischemia.  RECOMMENDATION: Patient was evaluated by Dr. Laneta Simmers will be transported directly from the cardiac  catheterization laboratory to undergo emergent CABG revascularization surgery.      INTRAOPERATIVE TEE Result status: Final result   Left ventricle: LV systolic function is low normal with an EF of 50-55%.  Aortic valve: No regurgitation.  Mitral valve: Trace regurgitation.    Patient Profile     Delilah Mulgrew is a 51 year old African-American female resident history of tobacco use, has suffered a small stroke (lacunar infarct) in September 2017. Over the past 2 weeks she has had recurrent episodes of chest pain. She presented to the emergency room at [facility] on 12/10/16. Her initial ECG was normal, but she developed recurrent chest pain in the emergency room associated with diffuse ST segment depression diffusely in leads 1 and L, inferiorly, and in V3 through V6 and also had 2 mm of ST segment elevation in leads aVR  Assessment & Plan    1. NSTEMI  Secondary to critical LM and multivessel CAD.  2. Day 1 s/p emergent CABG x3  With LIMA-LAD, SVG-OM, and SVG-RCA.  3. H/o lacunar CVA  4. HLD; will start atorvastatin 80 mg with ACS yesterday; check lipids  5. H/O tobacco abuse; discussed cessation.  Signed, Maisie Fus  A. Tresa EndoKelly, MD, [facility] Hospital[facility] 12/11/2016, 10:00 AM

## 2016-12-11 NOTE — Progress Notes (Signed)
Pt. Was able to pull 1025CC withFVC/-32 on nif,

## 2016-12-11 NOTE — Progress Notes (Signed)
Patient ID: Lawerance SabalFrances Issac, female   DOB: 12/30/1965, 51 y.o.   MRN: [id number] EVENING ROUNDS NOTE :     [address]       [place] [id number]             [phone]                 1 Day Post-Op Procedure(s) (LRB): CORONARY ARTERY BYPASS GRAFTING (CABG) times three using the left internal mammary artery and the right greater saphenous vein (N/A) TRANSESOPHAGEAL ECHOCARDIOGRAM (TEE) (N/A)  Total Length of Stay:  LOS: 1 day  BP (!) 144/72   Pulse 83   Temp 98.2 F (36.8 C)   Resp (!) 21   Ht 5\' 2"  (1.575 m)   Wt 198 lb 13.7 oz (90.2 kg)   LMP 08/03/2013   SpO2 95%   BMI 36.37 kg/m   .Intake/Output      02/06 0701 - 02/07 0700 02/07 0701 - 02/08 0700   P.O.  240   I.V. (mL/kg) 3021.8 (33.5) 240 (2.7)   Blood 325    IV Piggyback 850 150   Total Intake(mL/kg) 4196.8 (46.5) 630 (7)   Urine (mL/kg/hr) 1020 1810 (1.7)   Blood 1200    Chest Tube 250 170 (0.2)   Total Output 2470 1980   Net +1726.8 -1350          . sodium chloride Stopped (12/11/16 1000)  . sodium chloride    . sodium chloride Stopped (12/11/16 1300)  . lactated ringers 20 mL/hr at 12/11/16 0000  . lactated ringers 20 mL/hr at 12/10/16 2345  . nitroGLYCERIN    . phenylephrine (NEO-SYNEPHRINE) Adult infusion       Lab Results  Component Value Date   WBC 11.1 (H) 12/11/2016   HGB 11.6 (L) 12/11/2016   HCT 34.0 (L) 12/11/2016   PLT 247 12/11/2016   GLUCOSE 136 (H) 12/11/2016   ALT 15 12/10/2016   AST 18 12/10/2016   NA 137 12/11/2016   K 4.2 12/11/2016   CL 100 (L) 12/11/2016   CREATININE 0.60 12/11/2016   BUN 5 (L) 12/11/2016   CO2 24 12/11/2016   INR 1.44 12/10/2016    Tubes out good diuresis Stable   Delight OvensEdward B Arian Murley MD  Beeper [phone] Office [phone] 12/11/2016 6:54 PM

## 2016-12-11 NOTE — Progress Notes (Signed)
1 Day Post-Op Procedure(s) (LRB): CORONARY ARTERY BYPASS GRAFTING (CABG) times three using the left internal mammary artery and the right greater saphenous vein (N/A) TRANSESOPHAGEAL ECHOCARDIOGRAM (TEE) (N/A) Subjective: No complaints  Objective: Vital signs in last 24 hours: Temp:  [96.6 F (35.9 C)-97.9 F (36.6 C)] 97.5 F (36.4 C) (02/07 0730) Pulse Rate:  [65-89] 78 (02/07 0730) Cardiac Rhythm: Normal sinus rhythm (02/07 0445) Resp:  [4-29] 15 (02/07 0730) BP: (76-161)/(55-98) 97/68 (02/07 0700) SpO2:  [0 %-100 %] 100 % (02/07 0730) Arterial Line BP: (91-127)/(56-76) 104/62 (02/07 0730) FiO2 (%):  [40 %-60 %] 40 % (02/07 0530) Weight:  [86.2 kg (190 lb)-90.2 kg (198 lb 13.7 oz)] 90.2 kg (198 lb 13.7 oz) (02/07 0500)  Hemodynamic parameters for last 24 hours: PAP: (16-35)/(7-24) 30/16 CO:  [3.2 L/min-4.8 L/min] 4.8 L/min CI:  [1.7 L/min/m2-2.6 L/min/m2] 2.6 L/min/m2  Intake/Output from previous day: 02/06 0701 - 02/07 0700 In: 4196.8 [I.V.:3021.8; Blood:325; IV Piggyback:850] Out: 2470 [Urine:1020; Blood:1200; Chest Tube:250] Intake/Output this shift: No intake/output data recorded.  General appearance: alert and cooperative Neurologic: intact Heart: regular rate and rhythm, S1, S2 normal, no murmur, click, rub or gallop Lungs: clear to auscultation bilaterally Extremities: extremities normal, atraumatic, no cyanosis or edema Wound: dressing dry   Lab Results:  Recent Labs  12/10/16 2345 12/11/16 0452  WBC 10.7* 12.2*  HGB 10.4* 10.4*  HCT 30.2* 30.3*  PLT 216 217   BMET:  Recent Labs  12/10/16 1435  12/10/16 2242 12/11/16 0452  NA 140  < > 137 138  K 4.3  < > 3.9 4.7  CL 105  < > 100* 107  CO2 27  --   --  24  GLUCOSE 122*  < > 119* 109*  BUN 11  < > 8 7  CREATININE 0.72  < > 0.50 0.69  CALCIUM 9.2  --   --  7.5*  < > = values in this interval not displayed.  PT/INR:  Recent Labs  12/10/16 2345  LABPROT 17.7*  INR 1.44   ABG     Component Value Date/Time   PHART 7.306 (L) 12/11/2016 0730   HCO3 23.3 12/11/2016 0730   TCO2 25 12/11/2016 0730   ACIDBASEDEF 3.0 (H) 12/11/2016 0730   O2SAT 99.0 12/11/2016 0730   CBG (last 3)   Recent Labs  12/11/16 0459 12/11/16 0559 12/11/16 0728  GLUCAP 110* 98 135*   CXR: mild atelectasis and vascular congestion  Assessment/Plan: S/P Procedure(s) (LRB): CORONARY ARTERY BYPASS GRAFTING (CABG) times three using the left internal mammary artery and the right greater saphenous vein (N/A) TRANSESOPHAGEAL ECHOCARDIOGRAM (TEE) (N/A) Mobilize Diuresis d/c tubes/lines Continue foley due to diuresing patient and patient in ICU See progression orders   LOS: 1 day    Alleen BorneBryan K Bartle 12/11/2016

## 2016-12-11 NOTE — Procedures (Signed)
Extubation Procedure Note  Patient Details:   Name: Cindy SabalFrances Garrett DOB: 03/17/1966 MRN: [id number]   Airway Documentation:     Evaluation  O2 sats: stable throughout Complications: No apparent complications Patient did tolerate procedure well. Bilateral Breath Sounds: Clear, Diminished   Yes  [name], Babette Stum Mitchell 12/11/2016, 7:13 AM

## 2016-12-12 ENCOUNTER — Inpatient Hospital Stay (HOSPITAL_COMMUNITY): Payer: BLUE CROSS/BLUE SHIELD

## 2016-12-12 LAB — BASIC METABOLIC PANEL
ANION GAP: 9 (ref 5–15)
BUN: 6 mg/dL (ref 6–20)
CALCIUM: 8.3 mg/dL — AB (ref 8.9–10.3)
CO2: 27 mmol/L (ref 22–32)
CREATININE: 0.61 mg/dL (ref 0.44–1.00)
Chloride: 98 mmol/L — ABNORMAL LOW (ref 101–111)
GFR calc non Af Amer: >60 mL/min (ref >60)
GLUCOSE: 130 mg/dL — AB (ref 65–99)
Potassium: 4.2 mmol/L (ref 3.5–5.1)
Sodium: 134 mmol/L — ABNORMAL LOW (ref 135–145)

## 2016-12-12 LAB — POCT I-STAT 3, ART BLOOD GAS (G3+)
Acid-base deficit: 3 mmol/L — ABNORMAL HIGH (ref 0.0–2.0)
Bicarbonate: 22.9 mmol/L (ref 20.0–28.0)
O2 SAT: 91 %
PCO2 ART: 40.1 mmHg (ref 32.0–48.0)
PH ART: 7.361 (ref 7.350–7.450)
PO2 ART: 62 mmHg — AB (ref 83.0–108.0)
Patient temperature: 36.2
TCO2: 24 mmol/L (ref 0–100)

## 2016-12-12 LAB — GLUCOSE, CAPILLARY
GLUCOSE-CAPILLARY: 118 mg/dL — AB (ref 65–99)
Glucose-Capillary: 123 mg/dL — ABNORMAL HIGH (ref 65–99)

## 2016-12-12 LAB — LIPID PANEL
CHOLESTEROL: 123 mg/dL (ref 0–200)
HDL: 25 mg/dL — ABNORMAL LOW (ref >40)
LDL Cholesterol: 59 mg/dL (ref 0–99)
TRIGLYCERIDES: 195 mg/dL — AB (ref <150)
Total CHOL/HDL Ratio: 4.9 RATIO
VLDL: 39 mg/dL (ref 0–40)

## 2016-12-12 LAB — CBC
HCT: 32.5 % — ABNORMAL LOW (ref 36.0–46.0)
Hemoglobin: 11 g/dL — ABNORMAL LOW (ref 12.0–15.0)
MCH: 29.3 pg (ref 26.0–34.0)
MCHC: 33.8 g/dL (ref 30.0–36.0)
MCV: 86.7 fL (ref 78.0–100.0)
PLATELETS: 250 10*3/uL (ref 150–400)
RBC: 3.75 MIL/uL — AB (ref 3.87–5.11)
RDW: 12.6 % (ref 11.5–15.5)
WBC: 11.5 10*3/uL — ABNORMAL HIGH (ref 4.0–10.5)

## 2016-12-12 LAB — POCT I-STAT, CHEM 8
BUN: 7 mg/dL (ref 6–20)
CALCIUM ION: 1.14 mmol/L — AB (ref 1.15–1.40)
CHLORIDE: 101 mmol/L (ref 101–111)
Creatinine, Ser: 0.6 mg/dL (ref 0.44–1.00)
Glucose, Bld: 110 mg/dL — ABNORMAL HIGH (ref 65–99)
HCT: 29 % — ABNORMAL LOW (ref 36.0–46.0)
Hemoglobin: 9.9 g/dL — ABNORMAL LOW (ref 12.0–15.0)
POTASSIUM: 3.7 mmol/L (ref 3.5–5.1)
SODIUM: 138 mmol/L (ref 135–145)
TCO2: 24 mmol/L (ref 0–100)

## 2016-12-12 MED ORDER — TRAMADOL HCL 50 MG PO TABS: 50.0000 mg | ORAL_TABLET | ORAL | Status: DC | PRN

## 2016-12-12 MED ORDER — OXYCODONE HCL 5 MG PO TABS
5.0000 mg | ORAL_TABLET | ORAL | Status: DC | PRN
  Administered 2016-12-12 – 2016-12-13 (×3): 10 mg via ORAL
  Filled 2016-12-12 (×4): qty 2

## 2016-12-12 MED ORDER — BISACODYL 5 MG PO TBEC
10.0000 mg | DELAYED_RELEASE_TABLET | Freq: Every day | ORAL | Status: DC | PRN
  Administered 2016-12-13: 10 mg via ORAL
  Filled 2016-12-12: qty 2

## 2016-12-12 MED ORDER — ASPIRIN EC 81 MG PO TBEC
81.0000 mg | DELAYED_RELEASE_TABLET | Freq: Every day | ORAL | Status: DC
  Administered 2016-12-12 – 2016-12-14 (×3): 81 mg via ORAL
  Filled 2016-12-12 (×3): qty 1

## 2016-12-12 MED ORDER — MOVING RIGHT ALONG BOOK
Freq: Once | Status: AC
  Administered 2016-12-12: 09:00:00
  Filled 2016-12-12: qty 1

## 2016-12-12 MED ORDER — ACETAMINOPHEN 325 MG PO TABS: 650.0000 mg | ORAL_TABLET | Freq: Four times a day (QID) | ORAL | Status: DC | PRN

## 2016-12-12 MED ORDER — ONDANSETRON HCL 4 MG PO TABS: 4.0000 mg | ORAL_TABLET | Freq: Four times a day (QID) | ORAL | Status: DC | PRN

## 2016-12-12 MED ORDER — SODIUM CHLORIDE 0.9% FLUSH: 3.0000 mL | INTRAVENOUS | Status: DC | PRN

## 2016-12-12 MED ORDER — SODIUM CHLORIDE 0.9 % IV SOLN: 250.0000 mL | INTRAVENOUS | Status: DC | PRN

## 2016-12-12 MED ORDER — FUROSEMIDE 40 MG PO TABS
40.0000 mg | ORAL_TABLET | Freq: Every day | ORAL | Status: AC
  Administered 2016-12-12 – 2016-12-14 (×3): 40 mg via ORAL
  Filled 2016-12-12 (×3): qty 1

## 2016-12-12 MED ORDER — PANTOPRAZOLE SODIUM 40 MG PO TBEC
40.0000 mg | DELAYED_RELEASE_TABLET | Freq: Every day | ORAL | Status: DC
  Administered 2016-12-12 – 2016-12-14 (×3): 40 mg via ORAL
  Filled 2016-12-12 (×3): qty 1

## 2016-12-12 MED ORDER — POTASSIUM CHLORIDE CRYS ER 20 MEQ PO TBCR
20.0000 meq | EXTENDED_RELEASE_TABLET | Freq: Two times a day (BID) | ORAL | Status: DC
  Administered 2016-12-12 – 2016-12-14 (×5): 20 meq via ORAL
  Filled 2016-12-12 (×5): qty 1

## 2016-12-12 MED ORDER — BISACODYL 10 MG RE SUPP: 10.0000 mg | Freq: Every day | RECTAL | Status: DC | PRN

## 2016-12-12 MED ORDER — METOPROLOL TARTRATE 12.5 MG HALF TABLET
12.5000 mg | ORAL_TABLET | Freq: Two times a day (BID) | ORAL | Status: DC
  Administered 2016-12-12 – 2016-12-13 (×4): 12.5 mg via ORAL
  Filled 2016-12-12 (×5): qty 1

## 2016-12-12 MED ORDER — ONDANSETRON HCL 4 MG/2ML IJ SOLN: 4.0000 mg | Freq: Four times a day (QID) | INTRAMUSCULAR | Status: DC | PRN

## 2016-12-12 MED ORDER — DOCUSATE SODIUM 100 MG PO CAPS
200.0000 mg | ORAL_CAPSULE | Freq: Every day | ORAL | Status: DC
  Administered 2016-12-12 – 2016-12-14 (×3): 200 mg via ORAL
  Filled 2016-12-12 (×3): qty 2

## 2016-12-12 MED ORDER — CLOPIDOGREL BISULFATE 75 MG PO TABS
75.0000 mg | ORAL_TABLET | Freq: Every day | ORAL | Status: DC
  Administered 2016-12-12 – 2016-12-14 (×3): 75 mg via ORAL
  Filled 2016-12-12 (×3): qty 1

## 2016-12-12 MED ORDER — SODIUM CHLORIDE 0.9% FLUSH
3.0000 mL | Freq: Two times a day (BID) | INTRAVENOUS | Status: DC
  Administered 2016-12-12 – 2016-12-13 (×3): 3 mL via INTRAVENOUS

## 2016-12-12 MED FILL — Electrolyte-R (PH 7.4) Solution: INTRAVENOUS | Qty: 5000 | Status: AC

## 2016-12-12 MED FILL — Lidocaine HCl IV Inj 20 MG/ML: INTRAVENOUS | Qty: 5 | Status: AC

## 2016-12-12 MED FILL — Mannitol IV Soln 20%: INTRAVENOUS | Qty: 500 | Status: AC

## 2016-12-12 MED FILL — Heparin Sodium (Porcine) Inj 1000 Unit/ML: INTRAMUSCULAR | Qty: 10 | Status: AC

## 2016-12-12 MED FILL — Sodium Chloride IV Soln 0.9%: INTRAVENOUS | Qty: 2000 | Status: AC

## 2016-12-12 MED FILL — Sodium Bicarbonate IV Soln 8.4%: INTRAVENOUS | Qty: 50 | Status: AC

## 2016-12-12 NOTE — Care Management Note (Signed)
Case Management Note  Patient Details  Name: Cindy Garrett MRN: [id number] Date of Birth: 12/23/1965  Subjective/Objective:    S/p CABG                Action/Plan:  PTA independent from home with husband.  Pt states husband will provide 24 hour supervision for recommended time.  Pt states she has PCP that she sees on a regular basis and is able to obtain medications without hardship.  CM will continue to follow for discharge needs   Expected Discharge Date:                  Expected Discharge Plan:  Home/Self Care  In-House Referral:     Discharge planning Services  CM Consult  Post Acute Care Choice:    Choice offered to:     DME Arranged:    DME Agency:     HH Arranged:    HH Agency:     Status of Service:  In process, will continue to follow  If discussed at Long Length of Stay Meetings, dates discussed:    Additional Comments:  Cherylann ParrClaxton, [place] Dinneen S, RN 12/12/2016, 10:00 AM

## 2016-12-12 NOTE — Progress Notes (Signed)
Patient ambulated from 2S09 to new room 2W19, patient tolerated walk well, patient resting in chair, placed on tele, call bell and phone in reach, receiving RN/NT in room, VS stable, no questions from RN or patient.  Hermina BartersBOWMAN, Shakayla Hickox M, RN

## 2016-12-12 NOTE — Progress Notes (Signed)
1330 Came to see pt to walk. Pt just getting to bed. Stated she has walked four times already today. Encouraged another walk before bedtime. Will follow up tomorrow. Luetta Nuttingharlene Lia Vigilante RN BSN 12/12/2016 1:35 PM

## 2016-12-12 NOTE — Progress Notes (Signed)
12/12/2016 1235 Received pt to room 2W19 from 2S.  Pt is A&O, no c/o at this time.  Tele monitor applied and CCMD notified.  Oriented to room, call light and bed.  Call bell in reach. Kathryne HitchAllen, Virgilene Stryker C

## 2016-12-12 NOTE — Progress Notes (Signed)
2 Days Post-Op Procedure(s) (LRB): CORONARY ARTERY BYPASS GRAFTING (CABG) times three using the left internal mammary artery and the right greater saphenous vein (N/A) TRANSESOPHAGEAL ECHOCARDIOGRAM (TEE) (N/A) Subjective:  No complaints, walked well  Objective: Vital signs in last 24 hours: Temp:  [97.7 F (36.5 C)-98.9 F (37.2 C)] 98.8 F (37.1 C) (02/08 0753) Pulse Rate:  [70-87] 75 (02/08 0700) Cardiac Rhythm: Normal sinus rhythm (02/08 0730) Resp:  [9-21] 17 (02/08 0300) BP: (111-146)/(57-85) 125/74 (02/08 0700) SpO2:  [91 %-100 %] 94 % (02/08 0700) Weight:  [88.4 kg (194 lb 14.2 oz)] 88.4 kg (194 lb 14.2 oz) (02/08 0635)  Hemodynamic parameters for last 24 hours:    Intake/Output from previous day: 02/07 0701 - 02/08 0700 In: 1290 [P.O.:900; I.V.:240; IV Piggyback:150] Out: 2300 [Urine:2130; Chest Tube:170] Intake/Output this shift: No intake/output data recorded.  General appearance: alert and cooperative Neurologic: intact Heart: regular rate and rhythm, S1, S2 normal, no murmur, click, rub or gallop Lungs: clear to auscultation bilaterally Extremities: extremities normal, atraumatic, no cyanosis or edema Wound: dressing dry  Lab Results:  Recent Labs  12/11/16 1608 12/11/16 1613 12/12/16 0430  WBC 11.1*  --  11.5*  HGB 11.5* 11.6* 11.0*  HCT 33.8* 34.0* 32.5*  PLT 247  --  250   BMET:  Recent Labs  12/11/16 0452  12/11/16 1613 12/12/16 0430  NA 138  --  137 134*  K 4.7  --  4.2 4.2  CL 107  --  100* 98*  CO2 24  --   --  27  GLUCOSE 109*  --  136* 130*  BUN 7  --  5* 6  CREATININE 0.69  < > 0.60 0.61  CALCIUM 7.5*  --   --  8.3*  < > = values in this interval not displayed.  PT/INR:  Recent Labs  12/10/16 2345  LABPROT 17.7*  INR 1.44   ABG    Component Value Date/Time   PHART 7.306 (L) 12/11/2016 0730   HCO3 23.3 12/11/2016 0730   TCO2 27 12/11/2016 1613   ACIDBASEDEF 3.0 (H) 12/11/2016 0730   O2SAT 99.0 12/11/2016 0730   CBG  (last 3)   Recent Labs  12/11/16 2358 12/12/16 0346 12/12/16 0749  GLUCAP 122* 123* 118*    Assessment/Plan: S/P Procedure(s) (LRB): CORONARY ARTERY BYPASS GRAFTING (CABG) times three using the left internal mammary artery and the right greater saphenous vein (N/A) TRANSESOPHAGEAL ECHOCARDIOGRAM (TEE) (N/A)  She is hemodynamically stable in sinus rhythm. Continue low dose beta blocker, statin, add ACE I as tolerated. Will use ASA and Plavix in this pt. Mobilize Diuresis Plan for transfer to step-down: see transfer orders   LOS: 2 days    Alleen BorneBryan K Bartle 12/12/2016

## 2016-12-13 DIAGNOSIS — Z87891 Personal history of nicotine dependence: Secondary | ICD-10-CM

## 2016-12-13 LAB — BASIC METABOLIC PANEL
ANION GAP: 10 (ref 5–15)
BUN: 8 mg/dL (ref 6–20)
CHLORIDE: 94 mmol/L — AB (ref 101–111)
CO2: 28 mmol/L (ref 22–32)
Calcium: 8.5 mg/dL — ABNORMAL LOW (ref 8.9–10.3)
Creatinine, Ser: 0.74 mg/dL (ref 0.44–1.00)
GFR calc non Af Amer: >60 mL/min (ref >60)
Glucose, Bld: 187 mg/dL — ABNORMAL HIGH (ref 65–99)
POTASSIUM: 4.1 mmol/L (ref 3.5–5.1)
SODIUM: 132 mmol/L — AB (ref 135–145)

## 2016-12-13 MED ORDER — LISINOPRIL 5 MG PO TABS
5.0000 mg | ORAL_TABLET | Freq: Every day | ORAL | Status: DC
  Administered 2016-12-13 – 2016-12-14 (×2): 5 mg via ORAL
  Filled 2016-12-13 (×2): qty 1

## 2016-12-13 NOTE — Progress Notes (Addendum)
Progress Note  Patient Name: Cindy SabalFrances Brant Date of Encounter: 12/13/2016  Primary Cardiologist: New- will arrange for f/u in [place]  Subjective   Up with PT, doing well  Inpatient Medications    Scheduled Meds: . aspirin EC  81 mg Oral Daily  . atorvastatin  80 mg Oral q1800  . clopidogrel  75 mg Oral Daily  . docusate sodium  200 mg Oral Daily  . enoxaparin (LOVENOX) injection  40 mg Subcutaneous QHS  . furosemide  40 mg Oral Daily  . lisinopril  5 mg Oral Daily  . metoprolol tartrate  12.5 mg Oral BID  . pantoprazole  40 mg Oral QAC breakfast  . potassium chloride  20 mEq Oral BID  . sodium chloride flush  3 mL Intravenous Q12H   Continuous Infusions:  PRN Meds: sodium chloride, acetaminophen, bisacodyl **OR** bisacodyl, ondansetron **OR** ondansetron (ZOFRAN) IV, oxyCODONE, sodium chloride flush, traMADol   Vital Signs    Vitals:   12/12/16 1308 12/12/16 1953 12/13/16 0605 12/13/16 0932  BP: 123/67 (!) 143/70 127/77 134/73  Pulse: 72 83 77 85  Resp: 18 18 18    Temp: 98.1 F (36.7 C) 99.3 F (37.4 C) 99.4 F (37.4 C) 98.8 F (37.1 C)  TempSrc: Oral Oral Oral Oral  SpO2: 92% 92% 95%   Weight:   190 lb 11.2 oz (86.5 kg)   Height:        Intake/Output Summary (Last 24 hours) at 12/13/16 1058 Last data filed at 12/13/16 0827  Gross per 24 hour  Intake              600 ml  Output             1600 ml  Net            -1000 ml   Filed Weights   12/11/16 0500 12/12/16 0635 12/13/16 0605  Weight: 198 lb 13.7 oz (90.2 kg) 194 lb 14.2 oz (88.4 kg) 190 lb 11.2 oz (86.5 kg)    Telemetry    NSR - Personally Reviewed  ECG    NSR, NSST changes - Personally Reviewed  Physical Exam   GEN: No acute distress.   Neck: No JVD Cardiac: RRR, no murmurs, rubs, or gallops.  Respiratory: Clear to auscultation bilaterally. GI: Soft, nontender, non-distended  MS: No edema; No deformity. Neuro:  Nonfocal  Psych: Normal affect   Labs    Chemistry Recent  Labs Lab 12/10/16 1435  12/11/16 0452 12/11/16 1608 12/11/16 1613 12/12/16 0430 12/13/16 0241  NA 140  < > 138  --  137 134* 132*  K 4.3  < > 4.7  --  4.2 4.2 4.1  CL 105  < > 107  --  100* 98* 94*  CO2 27  --  24  --   --  27 28  GLUCOSE 122*  < > 109*  --  136* 130* 187*  BUN 11  < > 7  --  5* 6 8  CREATININE 0.72  < > 0.69 0.62 0.60 0.61 0.74  CALCIUM 9.2  --  7.5*  --   --  8.3* 8.5*  PROT 7.7  --   --   --   --   --   --   ALBUMIN 4.3  --   --   --   --   --   --   AST 18  --   --   --   --   --   --  ALT 15  --   --   --   --   --   --   ALKPHOS 97  --   --   --   --   --   --   BILITOT 0.4  --   --   --   --   --   --   GFRNONAA >60  --  >60 >60  --  >60 >60  GFRAA >60  --  >60 >60  --  >60 >60  ANIONGAP 8  --  7  --   --  9 10  < > = values in this interval not displayed.   Hematology Recent Labs Lab 12/11/16 0452 12/11/16 1608 12/11/16 1613 12/12/16 0430  WBC 12.2* 11.1*  --  11.5*  RBC 3.47* 3.88  --  3.75*  HGB 10.4* 11.5* 11.6* 11.0*  HCT 30.3* 33.8* 34.0* 32.5*  MCV 87.3 87.1  --  86.7  MCH 30.0 29.6  --  29.3  MCHC 34.3 34.0  --  33.8  RDW 12.8 12.9  --  12.6  PLT 217 247  --  250    Cardiac Enzymes Recent Labs Lab 12/10/16 1435  TROPONINI 0.08*   No results for input(s): TROPIPOC in the last 168 hours.   BNPNo results for input(s): BNP, PROBNP in the last 168 hours.   DDimer  Recent Labs Lab 12/10/16 1435  DDIMER <0.27     Radiology    Dg Chest Port 1 View  Result Date: 12/12/2016 CLINICAL DATA:  Post CABG EXAM: PORTABLE CHEST 1 VIEW COMPARISON:  12/11/2016 FINDINGS: Low lung volumes with bibasilar atelectasis. Heart is mildly enlarged. Interval removal Swan-Ganz catheter and left chest tube. No pneumothorax. IMPRESSION: Low volumes with bibasilar atelectasis. Some improvement in aeration since prior study. Electronically Signed   By: Charlett Nose M.D.   On: 12/12/2016 08:26    Cardiac Studies   Cath 12/10/16-  Ost RCA to Prox RCA  lesion, 60 %stenosed.  Ost LM lesion, 95 %stenosed.  Prox LAD to Mid LAD lesion, 70 %stenosed.  The left ventricular systolic function is normal.  LV end diastolic pressure is normal.  The left ventricular ejection fraction is 55-65% by visual estimate.     Patient Profile     51 y.o. female from [place],with a history of small lacunar stroke Sept 2017 and smoking, admitted through [facility] with [place]. Cath done 12/10/16 showed 95% LM with preserved LVF. She underwent urgent CABG x 3 12/10/16 (LIMA-LAD, SVG-OM, SVG-RCA).   Assessment & Plan    1. NSTEMI  Secondary to critical LM and multivessel CAD.  2. Day 3 s/p emergent CABG x3- LIMA-LAD, SVG-OM, and SVG-RCA.  3. H/o lacunar CVA Sept 2017  4. HLD;  atorvastatin 80 mg added  5. H/O tobacco abuse; discussed cessation.  Plan: Pt will most likely be discharged tomorrow. I have arranged for her to follow up in the [place] office as she lives in [place]. We will sign off.   Jolene Provost, PA-C  12/13/2016, 10:58 AM    Patient seen and examined. Agree with assessment and plan. Patient continues to be doing exceptionally well, day 3 following emergent CABG revascularization surgery after catheterization revealed critical ostial left main stenosis with multivessel CAD.  She is now on aspirin and Plavix and is without bleeding.  She is on atorvastatin 80 mg.  ACE inhibitor and has been started and she is tolerating lisinopril 5 mg in addition to low-dose  beta blocker therapy. I again discussed the importance of complete smoking cessation.  She seems committed to this.  She will be most likely discharged this weekend with plans for cardiology follow-up in [place].   Lennette Bihari, MD, [facility] 12/13/2016 1:12 PM

## 2016-12-13 NOTE — Progress Notes (Signed)
EPW removed per protocol at 0947. VSS. Telemetry notified. Pt completed 1 hr of bedrest.   Leonidas Rombergaitlin S Bumbledare, RN

## 2016-12-13 NOTE — Progress Notes (Signed)
CARDIAC REHAB PHASE I   PRE:  Rate/Rhythm: 86 SR  BP:  Supine:   Sitting: 154/74  Standing:    SaO2: 93%RA  MODE:  Ambulation: 700 ft   POST:  Rate/Rhythm: 83 SR  BP:  Supine:   Sitting: 140/78  Standing:    SaO2: 98%RA 1022-1112 Education completed while pt on bedrest from pacing wire removal. Encouraged IS and sternal precautions. Discussed smoking cessation and gave handout. Pt did not want fake cigarette but significant other wants to quit also and he requested one. Encouraged to call 1800quitnow if needed. Discussed CRP 2 and [facility]. Discussed heart healthy diet and ex ed. Pt walked 700 ft with steady gait and rolling walker. Pt does not think she will need walker for home use. To recliner with call bell.   Luetta Nuttingharlene Matalynn Graff, RN BSN  12/13/2016 11:07 AM

## 2016-12-13 NOTE — Progress Notes (Addendum)
      [address]       [place] [id number]             [phone]      3 Days Post-Op Procedure(s) (LRB): CORONARY ARTERY BYPASS GRAFTING (CABG) times three using the left internal mammary artery and the right greater saphenous vein (N/A) TRANSESOPHAGEAL ECHOCARDIOGRAM (TEE) (N/A)   Subjective:  [name] states she is doing well today.  States she was finally able to eat breakfast this morning. She is ambulating without difficulty.  Objective: Vital signs in last 24 hours: Temp:  [98.1 F (36.7 C)-99.4 F (37.4 C)] 99.4 F (37.4 C) (02/09 0605) Pulse Rate:  [72-83] 77 (02/09 0605) Cardiac Rhythm: Normal sinus rhythm (02/09 0746) Resp:  [15-25] 18 (02/09 0605) BP: (123-146)/(66-77) 127/77 (02/09 0605) SpO2:  [92 %-95 %] 95 % (02/09 0605) Weight:  [190 lb 11.2 oz (86.5 kg)] 190 lb 11.2 oz (86.5 kg) (02/09 0605)  Intake/Output from previous day: 02/08 0701 - 02/09 0700 In: 360 [P.O.:360] Out: 1750 [Urine:1750]  General appearance: alert, cooperative and no distress Heart: regular rate and rhythm Lungs: clear to auscultation bilaterally Abdomen: soft, non-tender; bowel sounds normal; no masses,  no organomegaly Extremities: edema trace Wound: clean and dry  Lab Results:  Recent Labs  12/11/16 1608 12/11/16 1613 12/12/16 0430  WBC 11.1*  --  11.5*  HGB 11.5* 11.6* 11.0*  HCT 33.8* 34.0* 32.5*  PLT 247  --  250   BMET:  Recent Labs  12/12/16 0430 12/13/16 0241  NA 134* 132*  K 4.2 4.1  CL 98* 94*  CO2 27 28  GLUCOSE 130* 187*  BUN 6 8  CREATININE 0.61 0.74  CALCIUM 8.3* 8.5*    PT/INR:  Recent Labs  12/10/16 2345  LABPROT 17.7*  INR 1.44   ABG    Component Value Date/Time   PHART 7.306 (L) 12/11/2016 0730   HCO3 23.3 12/11/2016 0730   TCO2 27 12/11/2016 1613   ACIDBASEDEF 3.0 (H) 12/11/2016 0730   O2SAT 99.0 12/11/2016 0730   CBG (last 3)   Recent Labs  12/11/16 2358 12/12/16 0346 12/12/16 0749  GLUCAP 122* 123*  118*    Assessment/Plan: S/P Procedure(s) (LRB): CORONARY ARTERY BYPASS GRAFTING (CABG) times three using the left internal mammary artery and the right greater saphenous vein (N/A) TRANSESOPHAGEAL ECHOCARDIOGRAM (TEE) (N/A)  1. CV- NSR, BP 120-140s- continue Lopressor, will add lisinopril 5mg  daily 2. Pulm- no acute issues, off oxygen, continue IS 3. Renal- creatinine stable, weight is stable no indication for Lasix at this time 4. Expected post operative anemia- Hgb stable at 11.0 5. Dispo- patient stable, possibly ready for d/c home today   LOS: 3 days    BARRETT, ERIN 12/13/2016   Chart reviewed, patient examined, agree with above. She is doing very well but only day 3 today. She can go home tomorrow if no issues today. Will plan to continue on ASA and Plavix.

## 2016-12-13 NOTE — Discharge Summary (Signed)
Physician Discharge Summary  Patient ID: Cindy Garrett MRN: [id number] DOB/AGE: 06-03-1966 51 y.o.  Admit date: 12/10/2016 Discharge date: 12/14/2016  Admission Diagnoses:  Patient Active Problem List   Diagnosis Date Noted  . SEMI (subendocardial myocardial infarction) (HCC) 12/10/2016  . NSTEMI (non-ST elevated myocardial infarction) (HCC)   . ACS (acute coronary syndrome) (HCC)   . Special screening for malignant neoplasms, colon    Discharge Diagnoses:   Patient Active Problem List   Diagnosis Date Noted  . SEMI (subendocardial myocardial infarction) (HCC) 12/10/2016  . 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 NSTEMI (non-ST elevated myocardial infarction) (HCC)   . ACS (acute coronary syndrome) (HCC)   . Special screening for malignant neoplasms, colon    Discharged Condition: good  History of Present Illness:  Cindy Garrett is a 51 yo AA female with history of stroke.  She present [facility] ER today with complaints of chest pain that had been occurring for the past 2 weeks.  Her pain worsened in the ER and she developed ST depression inferiorly and her troponin was minimally elevated.  She was given SL NTG which improved her pain.  Ultimately she was transferred to [facility] for emergent cardiac catheterization.  This showed significant CAD with a 95 % LM lesion.  She was chest pain free after the procedure, but it was felt she should be evaluated for emergent coronary bypass procedure.  She was evaluated by Dr. Laneta Simmers who was in agreement, the risks and benefits of the procedure were explained to the patient and she was agreeable to proceed.  Hospital Course:   She was taken to the operating room and underwent CABG x 3 utilizing LIMA to LAD, SVG to OM, and SVG to RCA.  She also underwent endoscopic harvest of greater saphenous vein from her right leg.  She tolerated the procedure without difficulty and was taken to the SICU in stable condition.  During her stay in the SICU the  patient was weaned and extubated.  Her chest tubes and arterial lines were removed without difficulty.  She was maintaining NSR.  She was ambulating independently around the ICU.  She felt medically stable for transfer to the telemetry unit for further convalescence on POD #2.  She continues to make progress.  She remains in NSR and her pacing wires were removed without difficulty.  Her blood pressure was mildly elevated and she was started on low dose Lisinopril.  She continues to ambulate independently.  Her pain is well controlled.  She is felt medically stable for discharge home today.    Significant Diagnostic Studies: angiography:    Ost RCA to Prox RCA lesion, 60 %stenosed.  Ost LM lesion, 95 %stenosed.  Prox LAD to Mid LAD lesion, 70 %stenosed.  The left ventricular systolic function is normal.  LV end diastolic pressure is normal.  The left ventricular ejection fraction is 55-65% by visual estimate.   Acute coronary syndrome with greater than 95% ostial left main stenosis, 70% proximal LAD stenosis, normal left circumflex coronary artery, and 60% very proximal RCA stenosis in a small RCA vessel.  Preserved global LV function with an ejection fraction at 55% and only a small region of subtle mild upper mid anterolateral hypocontractility.  The patient's transient ECG changes were concordant with left main disease and diffuse global ischemia.  Treatments: surgery:   1. Median Sternotomy 2. Extracorporeal circulation 3.   Coronary artery bypass grafting x 3   Left internal mammary graft to  the LAD  SVG to OM  SVG to RCA  4.   Endoscopic vein harvest from the right leg  Disposition: 01-Home or Self Care   Discharge Medications:  The patient has been discharged on:   1.Beta Blocker:  Yes [  x ]                              No   [   ]                              If No, reason:  2.Ace Inhibitor/ARB: Yes [ x  ]                                     No  [    ]                                      If No, reason:  3.Statin:   Yes [x   ]                  No  [   ]                  If No, reason:  4.Ecasa:  Yes  [ x  ]                  No   [   ]                  If No, reason:  Discharge Instructions    Amb Referral to Cardiac Rehabilitation    Complete by:  As directed    Diagnosis:   CABG NSTEMI     CABG X ___:  3     Allergies as of 12/14/2016      Reactions   Penicillins Itching, Rash   Has patient had a PCN reaction causing immediate rash, facial/tongue/throat swelling, SOB or lightheadedness with hypotension: Yes Has patient had a PCN reaction causing severe rash involving mucus membranes or skin necrosis: Yes Has patient had a PCN reaction that required hospitalization Yes Has patient had a PCN reaction occurring within the last 10 years: No If all of the above answers are "NO", then may proceed with Cephalosporin use.      Medication List    TAKE these medications   acetaminophen 325 MG tablet Commonly known as:  TYLENOL Take 2 tablets (650 mg total) by mouth every 6 (six) hours as needed for mild pain.   aspirin 81 MG EC tablet Take 1 tablet (81 mg total) by mouth daily.   atorvastatin 80 MG tablet Commonly known as:  LIPITOR Take 1 tablet (80 mg total) by mouth daily at 6 PM.   clopidogrel 75 MG tablet Commonly known as:  PLAVIX Take 1 tablet (75 mg total) by mouth daily.   lisinopril 5 MG tablet Commonly known as:  PRINIVIL,ZESTRIL Take 1 tablet (5 mg total) by mouth daily.   metoprolol tartrate 25 MG tablet Commonly known as:  LOPRESSOR Take 1 tablet (25 mg total) by mouth 2 (two) times daily.   oxyCODONE 5 MG immediate release tablet Commonly known as:  Oxy IR/ROXICODONE Take 1-2 tablets (  5-10 mg total) by mouth every 3 (three) hours as needed for severe pain.      Follow-up Information    Alleen Borne, MD Follow up on 01/15/2017.   Specialty:  Cardiothoracic Surgery Why:  Appointment is at 11:30,  please get CXR at 11:00 at [facility] located on first floor of our office building Contact information: [address] [place] [address] [phone]        [facility] Follow up on 12/20/2016.   Specialty:  Cardiothoracic Surgery Why:  Appointment is at 10:30 for suture removal Contact information: [address] [place], [address] [place] [address] [phone]       Joni Reining, NP Follow up on 12/27/2016.   Specialties:  Nurse Practitioner, Radiology, Cardiology Why:  1:10 pm Contact information: [address] [place] [address] [phone]           Signed: Lowella Dandy 12/14/2016, 8:50 AM

## 2016-12-13 NOTE — Discharge Instructions (Signed)
Coronary Artery Bypass Grafting, Care After °Refer to this sheet in the next few weeks. These instructions provide you with information on caring for yourself after your procedure. Your health care provider may also give you more specific instructions. Your treatment has been planned according to current medical practices, but problems sometimes occur. Call your health care provider if you have any problems or questions after your procedure. °WHAT TO EXPECT AFTER THE PROCEDURE °Recovery from surgery will be different for everyone. Some people feel well after 3 or 4 weeks, while for others it takes longer. After your procedure, it is typical to have the following: °· Nausea and a lack of appetite.   °· Constipation. °· Weakness and fatigue.   °· Depression or irritability.   °· Pain or discomfort at your incision site. °HOME CARE INSTRUCTIONS °· Take medicines only as directed by your health care provider. Do not stop taking medicines or start any new medicines without first checking with your health care provider. °· Take your pulse as directed by your health care provider. °· Perform deep breathing as directed by your health care provider. If you were given a device called an incentive spirometer, use it to practice deep breathing several times a day. Support your chest with a pillow or your arms when you take deep breaths or cough. °· Keep incision areas clean, dry, and protected. Remove or change any bandages (dressings) only as directed by your health care provider. You may have skin adhesive strips over the incision areas. Do not take the strips off. They will fall off on their own. °· Check incision areas daily for any swelling, redness, or drainage. °· If incisions were made in your legs, do the following: °¨ Avoid crossing your legs.   °¨ Avoid sitting for long periods of time. Change positions every 30 minutes.   °¨ Elevate your legs when you are sitting. °· Wear compression stockings as directed by your  health care provider. These stockings help keep blood clots from forming in your legs. °· Take showers once your health care provider approves. Until then, only take sponge baths. Pat incisions dry. Do not rub incisions with a washcloth or towel. Do not take baths, swim, or use a hot tub until your health care provider approves. °· Eat foods that are high in fiber, such as raw fruits and vegetables, whole grains, beans, and nuts. Meats should be lean cut. Avoid canned, processed, and fried foods. °· Drink enough fluid to keep your urine clear or pale yellow. °· Weigh yourself every day. This helps identify if you are retaining fluid that may make your heart and lungs work harder. °· Rest and limit activity as directed by your health care provider. You may be instructed to: °¨ Stop any activity at once if you have chest pain, shortness of breath, irregular heartbeats, or dizziness. Get help right away if you have any of these symptoms. °¨ Move around frequently for short periods or take short walks as directed by your health care provider. Increase your activities gradually. You may need physical therapy or cardiac rehabilitation to help strengthen your muscles and build your endurance. °¨ Avoid lifting, pushing, or pulling anything heavier than 10 lb (4.5 kg) for at least 6 weeks after surgery. °· Do not drive until your health care provider approves.  °· Ask your health care provider when you may return to work. °· Ask your health care provider when you may resume sexual activity. °· Keep all follow-up visits as directed by your health care   provider. This is important. °SEEK MEDICAL CARE IF: °· You have swelling, redness, increasing pain, or drainage at the site of an incision. °· You have a fever. °· You have swelling in your ankles or legs. °· You have pain in your legs.   °· You gain 2 or more pounds (0.9 kg) a day. °· You are nauseous or vomit. °· You have diarrhea.  °SEEK IMMEDIATE MEDICAL CARE IF: °· You have  chest pain that goes to your jaw or arms. °· You have shortness of breath.   °· You have a fast or irregular heartbeat.   °· You notice a "clicking" in your breastbone (sternum) when you move.   °· You have numbness or weakness in your arms or legs. °· You feel dizzy or light-headed.   °MAKE SURE YOU: °· Understand these instructions. °· Will watch your condition. °· Will get help right away if you are not doing well or get worse. °This information is not intended to replace advice given to you by your health care provider. Make sure you discuss any questions you have with your health care provider. °Document Released: 05/10/2005 Document Revised: 11/11/2014 Document Reviewed: 03/30/2013 °Elsevier Interactive Patient Education © 2017 [employer] ° ° °Endoscopic Saphenous Vein Harvesting, Care After °Introduction °Refer to this sheet in the next few weeks. These instructions provide you with information about caring for yourself after your procedure. Your health care provider may also give you more specific instructions. Your treatment has been planned according to current medical practices, but problems sometimes occur. Call your health care provider if you have any problems or questions after your procedure. °What can I expect after the procedure? °After the procedure, it is common to have: °· Pain. °· Bruising. °· Swelling. °· Numbness. °Follow these instructions at home: °Medicine °· Take over-the-counter and prescription medicines only as told by your health care provider. °· Do not drive or operate heavy machinery while taking prescription pain medicine. °Incision care °· Follow instructions from your health care provider about how to take care of the cut made during surgery (incision). Make sure you: °¨ Wash your hands with soap and water before you change your bandage (dressing). If soap and water are not available, use hand sanitizer. °¨ Change your dressing as told by your health care provider. °¨ Leave  stitches (sutures), skin glue, or adhesive strips in place. These skin closures may need to be in place for 2 weeks or longer. If adhesive strip edges start to loosen and curl up, you may trim the loose edges. Do not remove adhesive strips completely unless your health care provider tells you to do that. °· Check your incision area every day for signs of infection. Check for: °¨ More redness, swelling, or pain. °¨ More fluid or blood. °¨ Warmth. °¨ Pus or a bad smell. °General instructions °· Raise (elevate) your legs above the level of your heart while you are sitting or lying down. °· Do any exercises your health care providers have given you. These may include deep breathing, coughing, and walking exercises. °· Do not shower, take baths, swim, or use a hot tub unless told by your health care provider. °· Wear your elastic stocking if told by your health care provider. °· Keep all follow-up visits as told by your health care provider. This is important. °Contact a health care provider if: °· Medicine does not help your pain. °· Your pain gets worse. °· You have new leg bruises or your leg bruises get bigger. °· You have   a fever. °· Your leg feels numb. °· You have more redness, swelling, or pain around your incision. °· You have more fluid or blood coming from your incision. °· Your incision feels warm to the touch. °· You have pus or a bad smell coming from your incision. °Get help right away if: °· Your pain is severe. °· You develop pain, tenderness, warmth, redness, or swelling in any part of your leg. °· You have chest pain. °· You have trouble breathing. °This information is not intended to replace advice given to you by your health care provider. Make sure you discuss any questions you have with your health care provider. °Document Released: 07/03/2011 Document Revised: 03/28/2016 Document Reviewed: 09/04/2015 °© 2017 Elsevier ° ° °

## 2016-12-14 MED ORDER — ACETAMINOPHEN 325 MG PO TABS: 650.0000 mg | ORAL_TABLET | Freq: Four times a day (QID) | ORAL | Status: DC | PRN

## 2016-12-14 MED ORDER — CLOPIDOGREL BISULFATE 75 MG PO TABS: 75.0000 mg | ORAL_TABLET | Freq: Every day | ORAL | 3 refills | Status: DC

## 2016-12-14 MED ORDER — METOPROLOL TARTRATE 25 MG PO TABS: 25.0000 mg | ORAL_TABLET | Freq: Two times a day (BID) | ORAL | 3 refills | Status: DC

## 2016-12-14 MED ORDER — ATORVASTATIN CALCIUM 80 MG PO TABS: 80.0000 mg | ORAL_TABLET | Freq: Every day | ORAL | 3 refills | Status: DC

## 2016-12-14 MED ORDER — ASPIRIN 81 MG PO TBEC: 81.0000 mg | DELAYED_RELEASE_TABLET | Freq: Every day | ORAL | Status: DC

## 2016-12-14 MED ORDER — OXYCODONE HCL 5 MG PO TABS: 5.0000 mg | ORAL_TABLET | ORAL | 0 refills | Status: DC | PRN

## 2016-12-14 MED ORDER — METOPROLOL TARTRATE 25 MG PO TABS
25.0000 mg | ORAL_TABLET | Freq: Two times a day (BID) | ORAL | Status: DC
  Administered 2016-12-14: 25 mg via ORAL
  Filled 2016-12-14: qty 1

## 2016-12-14 MED ORDER — LISINOPRIL 5 MG PO TABS: 5.0000 mg | ORAL_TABLET | Freq: Every day | ORAL | 3 refills | Status: DC

## 2016-12-14 NOTE — Progress Notes (Signed)
Pt has been discharged home with husband. IV and telemetry box removed. Pt and pt's husband received discharge instructions and all questions were answered. Pt left with all of her belongings. Pt left the unit via wheelchair and was accompanied by a [place] volunteer and pt's husband.   Glynnis Gavel Moffitt BSN, RN 

## 2016-12-14 NOTE — Progress Notes (Addendum)
      [address]       [place] [id number]             [phone]      4 Days Post-Op Procedure(s) (LRB): CORONARY ARTERY BYPASS GRAFTING (CABG) times three using the left internal mammary artery and the right greater saphenous vein (N/A) TRANSESOPHAGEAL ECHOCARDIOGRAM (TEE) (N/A)   Subjective:  No complaints. Feels good and was able to finally move her bowels this morning.  She is ambulating independently.   Objective: Vital signs in last 24 hours: Temp:  [98.8 F (37.1 C)-99.1 F (37.3 C)] 98.9 F (37.2 C) (02/10 0640) Pulse Rate:  [79-93] 87 (02/10 0640) Cardiac Rhythm: Normal sinus rhythm (02/10 0738) Resp:  [18] 18 (02/09 1300) BP: (124-165)/(60-78) 124/70 (02/10 0640) SpO2:  [97 %-99 %] 99 % (02/10 0640) Weight:  [184 lb 6.4 oz (83.6 kg)] 184 lb 6.4 oz (83.6 kg) (02/10 0500)  Intake/Output from previous day: 02/09 0701 - 02/10 0700 In: 720 [P.O.:720] Out: 250 [Urine:250]  General appearance: alert, cooperative and no distress Heart: regular rate and rhythm Lungs: clear to auscultation bilaterally Abdomen: soft, non-tender; bowel sounds normal; no masses,  no organomegaly Extremities: extremities normal, atraumatic, no cyanosis or edema Wound: clean and dry  Lab Results:  Recent Labs  12/11/16 1608 12/11/16 1613 12/12/16 0430  WBC 11.1*  --  11.5*  HGB 11.5* 11.6* 11.0*  HCT 33.8* 34.0* 32.5*  PLT 247  --  250   BMET:  Recent Labs  12/12/16 0430 12/13/16 0241  NA 134* 132*  K 4.2 4.1  CL 98* 94*  CO2 27 28  GLUCOSE 130* 187*  BUN 6 8  CREATININE 0.61 0.74  CALCIUM 8.3* 8.5*    PT/INR: No results for input(s): LABPROT, INR in the last 72 hours. ABG    Component Value Date/Time   PHART 7.306 (L) 12/11/2016 0730   HCO3 23.3 12/11/2016 0730   TCO2 27 12/11/2016 1613   ACIDBASEDEF 3.0 (H) 12/11/2016 0730   O2SAT 99.0 12/11/2016 0730   CBG (last 3)   Recent Labs  12/11/16 2358 12/12/16 0346 12/12/16 0749  GLUCAP  122* 123* 118*    Assessment/Plan: S/P Procedure(s) (LRB): CORONARY ARTERY BYPASS GRAFTING (CABG) times three using the left internal mammary artery and the right greater saphenous vein (N/A) TRANSESOPHAGEAL ECHOCARDIOGRAM (TEE) (N/A)  1. CV- NSR, BP remains slightly elevated at times will increase Lopressor to 25 mg BID, continue Lisinopril 2. Pulm- no acute issues, continue IS at discharge 3. Renal- creatinine has been stable, weight is below baseline, last dose of lasix is due today 4. Dispo- patient stable, will increase BB, continue lisinopril, will d/c home today   LOS: 4 days    BARRETT, ERIN 12/14/2016 Patient seen. Agree with above  Viviann SpareSteven C. Dorris FetchHendrickson, MD [facility] [phone]]

## 2016-12-26 NOTE — Progress Notes (Signed)
Cardiology Office Note   Date:  12/27/2016   ID:  Cindy SabalFrances Garrett, DOB 03/23/1966, MRN [id number]  PCP:  Josue HectorNYLAND,LEONARD ROBERT, MD  Cardiologist: Elzie RingsNishan/ Jashayla Glatfelter, NP   Chief Complaint  Patient presents with  . Coronary Artery Disease    S/P CABG    History of Present Illness: Cindy SabalFrances Trippett is a 51 y.o. female who presents for ongoing assessment and management of CAD, s/p NSTEMI with admission to [facility] on 12/10/2016. Other history includes CVA. She was found to have 95% LM lesion per cardiac cath.  Cardiac Cath: 12/13/2016   Ost RCA to Prox RCA lesion, 60 %stenosed.  Ost LM lesion, 95 %stenosed.  Prox LAD to Mid LAD lesion, 70 %stenosed.  The left ventricular systolic function is normal.  LV end diastolic pressure is normal.  The left ventricular ejection fraction is 55-65% by visual estimate.  and referred for CABG. Had CABG X 3 by Dr. Laneta SimmersBartle LIMA to LAD, SVG to OM, and SVG to RCA.  She also underwent endoscopic harvest of greater saphenous vein from her right leg.She had an uneventful post operative course and was sent home on ASA, statin, clopidogrel, lisinopril, and metoprolol. She is here for post hospitalization follow up.   She is without complaint of significant chest pain, dyspnea or fatigue. She is tolerating medications without side effects. She has not yet followed up with her surgeon. She is interested in cardiac rehabilitation. Will not refer her Intal released by cardiothoracic surgery.  Past Medical History:  Diagnosis Date  . Stroke [facility](HCC)     Past Surgical History:  Procedure Laterality Date  . COLONOSCOPY N/A 05/26/2015   Procedure: COLONOSCOPY;  Surgeon: West BaliSandi L Fields, MD;  Location: [facility] ENDO SUITE;  Service: Endoscopy;  Laterality: N/A;  2:00  . CORONARY ARTERY BYPASS GRAFT N/A 12/10/2016   Procedure: CORONARY ARTERY BYPASS GRAFTING (CABG) times three using the left internal mammary artery and the right greater saphenous vein;  Surgeon: Alleen BorneBryan K Bartle,  MD;  Location: [facility] OR;  Service: Open Heart Surgery;  Laterality: N/A;  . LEFT HEART CATH AND CORONARY ANGIOGRAPHY N/A 12/10/2016   Procedure: Left Heart Cath and Coronary Angiography;  Surgeon: Lennette Biharihomas A Kelly, MD;  Location: [facility] INVASIVE CV LAB;  Service: Cardiovascular;  Laterality: N/A;  . TEE WITHOUT CARDIOVERSION N/A 12/10/2016   Procedure: TRANSESOPHAGEAL ECHOCARDIOGRAM (TEE);  Surgeon: Alleen BorneBryan K Bartle, MD;  [facility];  Service: Open Heart Surgery;  Laterality: N/A;  . TUBAL LIGATION       Current Outpatient Prescriptions  Medication Sig Dispense Refill  . acetaminophen (TYLENOL) 325 MG tablet Take 2 tablets (650 mg total) by mouth every 6 (six) hours as needed for mild pain.    Marland Kitchen. aspirin EC 81 MG EC tablet Take 1 tablet (81 mg total) by mouth daily.    Marland Kitchen. atorvastatin (LIPITOR) 80 MG tablet Take 1 tablet (80 mg total) by mouth daily at 6 PM. 30 tablet 3  . clopidogrel (PLAVIX) 75 MG tablet Take 1 tablet (75 mg total) by mouth daily. 30 tablet 3  . lisinopril (PRINIVIL,ZESTRIL) 5 MG tablet Take 1 tablet (5 mg total) by mouth daily. 30 tablet 3  . metoprolol tartrate (LOPRESSOR) 25 MG tablet Take 1 tablet (25 mg total) by mouth 2 (two) times daily. 60 tablet 3  . oxyCODONE (OXY IR/ROXICODONE) 5 MG immediate release tablet Take 1-2 tablets (5-10 mg total) by mouth every 3 (three) hours as needed for severe pain. 30 tablet 0   No  current facility-administered medications for this visit.     Allergies:   Penicillins    Social History:  The patient  reports that she quit smoking about 2 weeks ago. Her smoking use included Cigarettes. She has a 30.00 pack-year smoking history. She has never used smokeless tobacco. She reports that she drinks alcohol. She reports that she does not use drugs.   Family History:  The patient's family history includes Diabetes in her father.    ROS: All other systems are reviewed and negative. Unless otherwise mentioned in H&P    PHYSICAL EXAM: VS:  BP (!)  142/66   Pulse 60   Ht 5\' 5"  (1.651 m)   Wt 188 lb (85.3 kg)   LMP 08/03/2013   SpO2 99%   BMI 31.28 kg/m  , BMI Body mass index is 31.28 kg/m. GEN: Well nourished, well developed, in no acute distress  HEENT: normal  Neck: no JVD, carotid bruits, or masses Cardiac: RRR; no murmurs, rubs, or gallops,no edema  Respiratory:  clear to auscultation bilaterally, normal work of breathing GI: soft, nontender, nondistended, + BS MS: no deformity or atrophy sternotomy site is well-healed, no evidence of evisceration infection or bleeding. Saphenous vein graft harvest site is well-healed. Skin: warm and dry, no rash Neuro:  Strength and sensation are intact Psych: euthymic mood, full affect   Recent Labs: 12/10/2016: ALT 15 12/11/2016: Magnesium 2.2 12/12/2016: Hemoglobin 11.0; Platelets 250 12/13/2016: BUN 8; Creatinine, Ser 0.74; Potassium 4.1; Sodium 132    Lipid Panel    Component Value Date/Time   CHOL 123 12/12/2016 0430   TRIG 195 (H) 12/12/2016 0430   HDL 25 (L) 12/12/2016 0430   CHOLHDL 4.9 12/12/2016 0430   VLDL 39 12/12/2016 0430   LDLCALC 59 12/12/2016 0430      Wt Readings from Last 3 Encounters:  12/27/16 188 lb (85.3 kg)  12/14/16 184 lb 6.4 oz (83.6 kg)  07/14/16 192 lb (87.1 kg)        ASSESSMENT AND PLAN:  1.  Coronary artery disease: 90% left main disease leading to coronary artery bypass grafting as described above. She is feeling well and has been without complaint of recurrent chest pain dizziness or significant fatigue. She is tolerating her medications without difficulty. Sternotomy site looks well healed without evidence of evisceration infection or bleeding. She will continue on her current medication regimen without changes. She is doing well and medically compliant. Would like to refer her to cardiac rehabilitation but will await release from cardiothoracic surgery before moving forward with this.  I have given her a copy of her cardiac catheterization  report in diagram. I've asked her to bring that with her when she follows up with her as cardiothoracic surgeon so that he can draw and where the grafts were placed.  2. History of CVA: Remains on Plavix and aspirin.  3. Hypercholesterolemia: Patient is on atorvastatin 80 mg daily. She'll need follow-up labs. This can be completed by primary care one we see her again in 3 months.   Current medicines are reviewed at length with the patient today.    Labs/ tests ordered today include: No orders of the defined types were placed in this encounter.    Disposition:   FU with  3 months.  Signed, Joni Reining, NP  12/27/2016 5:11 PM    [facility] [address], [place], [place] [address] Phone: [phone]; Fax: [phone]

## 2016-12-27 ENCOUNTER — Ambulatory Visit (INDEPENDENT_AMBULATORY_CARE_PROVIDER_SITE_OTHER): Payer: BLUE CROSS/BLUE SHIELD | Admitting: Adult Health

## 2016-12-27 ENCOUNTER — Ambulatory Visit: Payer: BLUE CROSS/BLUE SHIELD | Admitting: Adult Health

## 2016-12-27 ENCOUNTER — Encounter: Payer: Self-pay | Admitting: Adult Health

## 2016-12-27 VITALS — BP 142/66 | HR 60 | Ht 65.0 in | Wt 188.0 lb

## 2016-12-27 DIAGNOSIS — E7800 Pure hypercholesterolemia, unspecified: Secondary | ICD-10-CM

## 2016-12-27 DIAGNOSIS — I2510 Atherosclerotic heart disease of native coronary artery without angina pectoris: Secondary | ICD-10-CM

## 2016-12-27 NOTE — Patient Instructions (Signed)
Your physician recommends that you schedule a follow-up appointment in: 3 Months   Your physician recommends that you continue on your current medications as directed. Please refer to the Current Medication list given to you today.  If you need a refill on your cardiac medications before your next appointment, please call your pharmacy.  Thank you for choosing [facility]!    

## 2016-12-27 NOTE — Progress Notes (Signed)
Name: Cindy Garrett    DOB: [birth year]  Age: 51 y.o.  MR#: [id number]       PCP:  Josue Hector, MD      Insurance: Payor: BLUE CROSS BLUE SHIELD / Plan: BCBS OTHER / Product Type: *No Product type* /   CC:   No chief complaint on file.   VS Vitals:   12/27/16 1529  BP: (!) 142/66  Pulse: 60  SpO2: 99%  Weight: 188 lb (85.3 kg)  Height: 5\' 5"  (1.651 m)    Weights Current Weight  12/27/16 188 lb (85.3 kg)  12/14/16 184 lb 6.4 oz (83.6 kg)  07/14/16 192 lb (87.1 kg)    Blood Pressure  BP Readings from Last 3 Encounters:  12/27/16 (!) 142/66  12/14/16 109/80  07/14/16 141/78     Admit date:  (Not on file) Last encounter with RMR:  Visit date not found   Allergy Penicillins  Current Outpatient Prescriptions  Medication Sig Dispense Refill  . acetaminophen (TYLENOL) 325 MG tablet Take 2 tablets (650 mg total) by mouth every 6 (six) hours as needed for mild pain.    Marland Kitchen aspirin EC 81 MG EC tablet Take 1 tablet (81 mg total) by mouth daily.    Marland Kitchen atorvastatin (LIPITOR) 80 MG tablet Take 1 tablet (80 mg total) by mouth daily at 6 PM. 30 tablet 3  . clopidogrel (PLAVIX) 75 MG tablet Take 1 tablet (75 mg total) by mouth daily. 30 tablet 3  . lisinopril (PRINIVIL,ZESTRIL) 5 MG tablet Take 1 tablet (5 mg total) by mouth daily. 30 tablet 3  . metoprolol tartrate (LOPRESSOR) 25 MG tablet Take 1 tablet (25 mg total) by mouth 2 (two) times daily. 60 tablet 3  . oxyCODONE (OXY IR/ROXICODONE) 5 MG immediate release tablet Take 1-2 tablets (5-10 mg total) by mouth every 3 (three) hours as needed for severe pain. 30 tablet 0   No current facility-administered medications for this visit.     Discontinued Meds:   There are no discontinued medications.  Patient Active Problem List   Diagnosis Date Noted  . SEMI (subendocardial myocardial infarction) (HCC) 12/10/2016  . 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 NSTEMI (non-ST elevated myocardial infarction) (HCC)   . ACS (acute coronary syndrome)  (HCC)   . Special screening for malignant neoplasms, colon     LABS    Component Value Date/Time   NA 132 (L) 12/13/2016 0241   NA 134 (L) 12/12/2016 0430   NA 137 12/11/2016 1613   K 4.1 12/13/2016 0241   K 4.2 12/12/2016 0430   K 4.2 12/11/2016 1613   CL 94 (L) 12/13/2016 0241   CL 98 (L) 12/12/2016 0430   CL 100 (L) 12/11/2016 1613   CO2 28 12/13/2016 0241   CO2 27 12/12/2016 0430   CO2 24 12/11/2016 0452   GLUCOSE 187 (H) 12/13/2016 0241   GLUCOSE 130 (H) 12/12/2016 0430   GLUCOSE 136 (H) 12/11/2016 1613   BUN 8 12/13/2016 0241   BUN 6 12/12/2016 0430   BUN 5 (L) 12/11/2016 1613   CREATININE 0.74 12/13/2016 0241   CREATININE 0.61 12/12/2016 0430   CREATININE 0.60 12/11/2016 1613   CALCIUM 8.5 (L) 12/13/2016 0241   CALCIUM 8.3 (L) 12/12/2016 0430   CALCIUM 7.5 (L) 12/11/2016 0452   GFRNONAA >60 12/13/2016 0241   GFRNONAA >60 12/12/2016 0430   GFRNONAA >60 12/11/2016 1608   GFRAA >60 12/13/2016 0241   GFRAA >60 12/12/2016 0430   GFRAA >60  12/11/2016 1608   CMP     Component Value Date/Time   NA 132 (L) 12/13/2016 0241   K 4.1 12/13/2016 0241   CL 94 (L) 12/13/2016 0241   CO2 28 12/13/2016 0241   GLUCOSE 187 (H) 12/13/2016 0241   BUN 8 12/13/2016 0241   CREATININE 0.74 12/13/2016 0241   CALCIUM 8.5 (L) 12/13/2016 0241   PROT 7.7 12/10/2016 1435   ALBUMIN 4.3 12/10/2016 1435   AST 18 12/10/2016 1435   ALT 15 12/10/2016 1435   ALKPHOS 97 12/10/2016 1435   BILITOT 0.4 12/10/2016 1435   GFRNONAA >60 12/13/2016 0241   GFRAA >60 12/13/2016 0241       Component Value Date/Time   WBC 11.5 (H) 12/12/2016 0430   WBC 11.1 (H) 12/11/2016 1608   WBC 12.2 (H) 12/11/2016 0452   HGB 11.0 (L) 12/12/2016 0430   HGB 11.6 (L) 12/11/2016 1613   HGB 11.5 (L) 12/11/2016 1608   HCT 32.5 (L) 12/12/2016 0430   HCT 34.0 (L) 12/11/2016 1613   HCT 33.8 (L) 12/11/2016 1608   MCV 86.7 12/12/2016 0430   MCV 87.1 12/11/2016 1608   MCV 87.3 12/11/2016 0452    Lipid Panel      Component Value Date/Time   CHOL 123 12/12/2016 0430   TRIG 195 (H) 12/12/2016 0430   HDL 25 (L) 12/12/2016 0430   CHOLHDL 4.9 12/12/2016 0430   VLDL 39 12/12/2016 0430   LDLCALC 59 12/12/2016 0430    ABG    Component Value Date/Time   PHART 7.306 (L) 12/11/2016 0730   PCO2ART 46.4 12/11/2016 0730   PO2ART 148.0 (H) 12/11/2016 0730   HCO3 23.3 12/11/2016 0730   TCO2 27 12/11/2016 1613   ACIDBASEDEF 3.0 (H) 12/11/2016 0730   O2SAT 99.0 12/11/2016 0730     No results found for: TSH BNP (last 3 results) No results for input(s): BNP in the last 8760 hours.  ProBNP (last 3 results) No results for input(s): PROBNP in the last 8760 hours.  Cardiac Panel (last 3 results) No results for input(s): CKTOTAL, CKMB, TROPONINI, RELINDX in the last 72 hours.  Iron/TIBC/Ferritin/ %Sat No results found for: IRON, TIBC, FERRITIN, IRONPCTSAT   EKG Orders placed or performed during the hospital encounter of 12/10/16  . ED EKG within 10 minutes  . ED EKG within 10 minutes  . EKG 12-Lead  . EKG 12-Lead  . EKG 12-Lead  . EKG 12-Lead  . EKG 12-Lead  . EKG 12-Lead  . EKG 12-Lead  . EKG 12-Lead  . EKG 12-Lead  . EKG 12-Lead  . EKG     Prior Assessment and Plan Problem List as of 12/27/2016 Reviewed: 12/10/2016  7:11 PM by Arlice ColtMANESS,CARRIE, CRNA     Cardiovascular and Mediastinum   SEMI (subendocardial myocardial infarction) [facility](HCC)   NSTEMI (non-ST elevated myocardial infarction) [facility](HCC)   ACS (acute coronary syndrome) (HCC)     Other   Special screening for malignant neoplasms, colon   S/P CABG x 3       Imaging: Dg Chest 2 View  Result Date: 12/10/2016 CLINICAL DATA:  Chest/ abdominal pain, asthma, smoker EXAM: CHEST  2 VIEW COMPARISON:  07/14/2016 FINDINGS: Lungs are clear.  No pleural effusion or pneumothorax. The heart is normal in size. Visualized osseous structures are within normal limits. IMPRESSION: Normal chest radiographs. Electronically Signed   By: Charline BillsSriyesh  Krishnan M.D.    On: 12/10/2016 14:56   Dg Chest Port 1 View  Result Date: 12/12/2016 CLINICAL  DATA:  Post CABG EXAM: PORTABLE CHEST 1 VIEW COMPARISON:  12/11/2016 FINDINGS: Low lung volumes with bibasilar atelectasis. Heart is mildly enlarged. Interval removal Swan-Ganz catheter and left chest tube. No pneumothorax. IMPRESSION: Low volumes with bibasilar atelectasis. Some improvement in aeration since prior study. Electronically Signed   By: Charlett Nose M.D.   On: 12/12/2016 08:26   Dg Chest Port 1 View  Result Date: 12/11/2016 CLINICAL DATA:  Recent coronary artery bypass grafting. Atelectatic change. EXAM: PORTABLE CHEST 1 VIEW COMPARISON:  December 10, 2016 FINDINGS: Endotracheal tube and nasogastric tube have been removed. Swan-Ganz catheter tip is in proximal most aspect of the right main pulmonary artery. There is a mediastinal drain and left chest tube present, unchanged. No pneumothorax. There is slight left base atelectasis. Lungs elsewhere are clear. Heart is upper normal in size with pulmonary vascularity within normal limits. No adenopathy. IMPRESSION: Tube and catheter positions as described without pneumothorax. Mild left base atelectasis. Lungs elsewhere clear. Stable cardiac silhouette. Electronically Signed   By: Bretta Bang III M.D.   On: 12/11/2016 08:35   Dg Chest Port 1 View  Result Date: 12/11/2016 CLINICAL DATA:  Postoperative coronary artery bypass. EXAM: PORTABLE CHEST 1 VIEW COMPARISON:  12/10/2016 FINDINGS: Interval postoperative changes with median sternotomy wires and surgical clips in the mediastinum. An endotracheal tube is been placed. The tip measures about 11 mm above the carina. Right Swan-Ganz catheter with tip in the pulmonary outflow tract region. Mediastinal drains. Left chest tube. Shallow inspiration. Heart size and pulmonary vascularity are normal. Focal infiltration or atelectasis in the left upper lung. No pneumothorax. Degenerative changes in the spine. IMPRESSION:  Appliances appear in satisfactory location. Atelectasis or focal consolidation in the left upper lung. Electronically Signed   By: Burman Nieves M.D.   On: 12/11/2016 00:03

## 2017-01-13 ENCOUNTER — Other Ambulatory Visit: Payer: Self-pay | Admitting: Surgery

## 2017-01-13 DIAGNOSIS — Z951 Presence of aortocoronary bypass graft: Secondary | ICD-10-CM

## 2017-01-15 ENCOUNTER — Ambulatory Visit: Payer: BLUE CROSS/BLUE SHIELD | Admitting: Surgery

## 2017-01-22 ENCOUNTER — Encounter: Payer: Self-pay | Admitting: Surgery

## 2017-01-22 ENCOUNTER — Ambulatory Visit (INDEPENDENT_AMBULATORY_CARE_PROVIDER_SITE_OTHER): Payer: Self-pay | Admitting: Surgery

## 2017-01-22 ENCOUNTER — Ambulatory Visit
Admission: RE | Admit: 2017-01-22 | Discharge: 2017-01-22 | Disposition: A | Discharge status: Home / Self Care | Payer: BLUE CROSS/BLUE SHIELD | Source: Ambulatory Visit | Attending: Surgery | Admitting: Surgery

## 2017-01-22 VITALS — BP 160/79 | HR 63 | Resp 20 | Ht 64.0 in | Wt 189.0 lb

## 2017-01-22 DIAGNOSIS — Z951 Presence of aortocoronary bypass graft: Secondary | ICD-10-CM

## 2017-01-22 NOTE — Progress Notes (Signed)
     HPI: Patient returns for routine postoperative follow-up having undergone emergent CABG x 3 on 12/10/2016. The patient's early postoperative recovery while in the hospital was notable for an uncomplicated postop course. Since hospital discharge the patient reports that she has felt well without chest pain or shortness of breath with ambulation. She did smoke 6-8 cigarettes last week due to stress from the death of her uncle but she says that she has quit.   Current Outpatient Prescriptions  Medication Sig Dispense Refill  . acetaminophen (TYLENOL) 325 MG tablet Take 2 tablets (650 mg total) by mouth every 6 (six) hours as needed for mild pain.    Marland Kitchen. aspirin EC 81 MG EC tablet Take 1 tablet (81 mg total) by mouth daily.    Marland Kitchen. atorvastatin (LIPITOR) 80 MG tablet Take 1 tablet (80 mg total) by mouth daily at 6 PM. 30 tablet 3  . clopidogrel (PLAVIX) 75 MG tablet Take 1 tablet (75 mg total) by mouth daily. 30 tablet 3  . lisinopril (PRINIVIL,ZESTRIL) 5 MG tablet Take 1 tablet (5 mg total) by mouth daily. 30 tablet 3  . metoprolol tartrate (LOPRESSOR) 25 MG tablet Take 1 tablet (25 mg total) by mouth 2 (two) times daily. 60 tablet 3  . oxyCODONE (OXY IR/ROXICODONE) 5 MG immediate release tablet Take 1-2 tablets (5-10 mg total) by mouth every 3 (three) hours as needed for severe pain. 30 tablet 0   No current facility-administered medications for this visit.     Physical Exam: BP (!) 160/79   Pulse 63   Resp 20   Ht 5\' 4"  (1.626 m)   Wt 189 lb (85.7 kg)   LMP 08/03/2013   SpO2 97% Comment: RA  BMI 32.44 kg/m  She looks well. Lung exam is clear. Cardiac exam shows a regular rate and rhythm with normal heart sounds. Chest incision is healing well and sternum is stable. The leg incisions are healing well and there is no peripheral edema.   Diagnostic Tests:  CLINICAL DATA:  Mild cough  EXAM: CHEST  2 VIEW  COMPARISON:  12/12/2016  FINDINGS: The heart size and mediastinal  contours are within normal limits. Both lungs are clear. The visualized skeletal structures are unremarkable. Postsurgical changes are again noted.  IMPRESSION: No active cardiopulmonary disease.   Electronically Signed   By: Alcide CleverMark  Lukens M.D.   On: 01/22/2017 14:47   Impression:  Overall I think she is doing well. I encouraged her to continue walking. She is planning to participate in cardiac rehab. I told her she could drive a car but should not lift anything heavier than 10 lbs for three months postop. I stressed the importance of smoking cessation and cardiac risk factor reduction.   Plan:  She will continue to follow up with cardiology and will return to see me if she develops any problems with her incisions.   Alleen BorneBryan K Deserea Bordley, MD [facility] [phone]

## 2017-03-20 ENCOUNTER — Encounter (HOSPITAL_COMMUNITY): Admission: RE | Admit: 2017-03-20 | Payer: BLUE CROSS/BLUE SHIELD | Source: Ambulatory Visit

## 2017-03-27 ENCOUNTER — Ambulatory Visit: Payer: BLUE CROSS/BLUE SHIELD | Admitting: Cardiology

## 2017-03-28 ENCOUNTER — Ambulatory Visit (INDEPENDENT_AMBULATORY_CARE_PROVIDER_SITE_OTHER): Payer: BLUE CROSS/BLUE SHIELD | Admitting: Cardiology

## 2017-03-28 ENCOUNTER — Encounter: Payer: Self-pay | Admitting: Cardiology

## 2017-03-28 VITALS — BP 168/92 | HR 71 | Ht 62.0 in | Wt 189.0 lb

## 2017-03-28 DIAGNOSIS — I25118 Atherosclerotic heart disease of native coronary artery with other forms of angina pectoris: Secondary | ICD-10-CM

## 2017-03-28 DIAGNOSIS — I214 Non-ST elevation (NSTEMI) myocardial infarction: Secondary | ICD-10-CM | POA: Diagnosis not present

## 2017-03-28 DIAGNOSIS — I10 Essential (primary) hypertension: Secondary | ICD-10-CM | POA: Diagnosis not present

## 2017-03-28 MED ORDER — METOPROLOL TARTRATE 25 MG PO TABS: 12.5000 mg | ORAL_TABLET | Freq: Two times a day (BID) | ORAL | 3 refills | Status: DC

## 2017-03-28 NOTE — Patient Instructions (Signed)
Medication Instructions:  DECREASE LOPRESSOR TO 12.5 MG TWICE DAILY   Labwork: NONE  Testing/Procedures: Your physician has requested that you have an echocardiogram. Echocardiography is a painless test that uses sound waves to create images of your heart. It provides your doctor with information about the size and shape of your heart and how well your heart's chambers and valves are working. This procedure takes approximately one hour. There are no restrictions for this procedure.  You have been referred to CARDIAC REHAB    Follow-Up: Your physician recommends that you schedule a follow-up appointment in: NURSE BLOOD PRESSURE CHECK ON DAY OF ECHO  Your physician wants you to follow-up in: 6 MONTHS.  You will receive a reminder letter in the mail two months in advance. If you don't receive a letter, please call our office to schedule the follow-up appointment.   Any Other Special Instructions Will Be Listed Below (If Applicable).     If you need a refill on your cardiac medications before your next appointment, please call your pharmacy.

## 2017-03-28 NOTE — Progress Notes (Signed)
Clinical Summary Cindy Garrett is a 51 y.o.female seen today for follow up of the following medical problems.   1. CAD - admit 12/2016 with NSTEMI - cath showed LM disease, referred for CABG (LIMA-LAD, SVG-OM, SVG-RCA) - denies any chest pain, can have chest soreness at surgical site - no SOB or DOE. - awaiting cardiac rehab.   - mixed compliance, has some fatigue on meds    2. HTN - has not taken meds yet today - does not check bp at home.  Past Medical History:  Diagnosis Date  . Stroke [facility](HCC)      Allergies  Allergen Reactions  . Penicillins Itching and Rash    Has patient had a PCN reaction causing immediate rash, facial/tongue/throat swelling, SOB or lightheadedness with hypotension: Yes Has patient had a PCN reaction causing severe rash involving mucus membranes or skin necrosis: Yes Has patient had a PCN reaction that required hospitalization Yes Has patient had a PCN reaction occurring within the last 10 years: No If all of the above answers are "NO", then may proceed with Cephalosporin use.      Current Outpatient Prescriptions  Medication Sig Dispense Refill  . acetaminophen (TYLENOL) 325 MG tablet Take 2 tablets (650 mg total) by mouth every 6 (six) hours as needed for mild pain.    Marland Kitchen. aspirin EC 81 MG EC tablet Take 1 tablet (81 mg total) by mouth daily.    Marland Kitchen. atorvastatin (LIPITOR) 80 MG tablet Take 1 tablet (80 mg total) by mouth daily at 6 PM. 30 tablet 3  . clopidogrel (PLAVIX) 75 MG tablet Take 1 tablet (75 mg total) by mouth daily. 30 tablet 3  . lisinopril (PRINIVIL,ZESTRIL) 5 MG tablet Take 1 tablet (5 mg total) by mouth daily. 30 tablet 3  . metoprolol tartrate (LOPRESSOR) 25 MG tablet Take 1 tablet (25 mg total) by mouth 2 (two) times daily. 60 tablet 3  . oxyCODONE (OXY IR/ROXICODONE) 5 MG immediate release tablet Take 1-2 tablets (5-10 mg total) by mouth every 3 (three) hours as needed for severe pain. 30 tablet 0   No current facility-administered  medications for this visit.      Past Surgical History:  Procedure Laterality Date  . COLONOSCOPY N/A 05/26/2015   Procedure: COLONOSCOPY;  Surgeon: West BaliSandi L Fields, MD;  Location: [facility] ENDO SUITE;  Service: Endoscopy;  Laterality: N/A;  2:00  . CORONARY ARTERY BYPASS GRAFT N/A 12/10/2016   Procedure: CORONARY ARTERY BYPASS GRAFTING (CABG) times three using the left internal mammary artery and the right greater saphenous vein;  Surgeon: Alleen BorneBryan K Bartle, MD;  Location: [facility] OR;  Service: Open Heart Surgery;  Laterality: N/A;  . LEFT HEART CATH AND CORONARY ANGIOGRAPHY N/A 12/10/2016   Procedure: Left Heart Cath and Coronary Angiography;  Surgeon: Lennette Biharihomas A Kelly, MD;  Location: [facility] INVASIVE CV LAB;  Service: Cardiovascular;  Laterality: N/A;  . TEE WITHOUT CARDIOVERSION N/A 12/10/2016   Procedure: TRANSESOPHAGEAL ECHOCARDIOGRAM (TEE);  Surgeon: Alleen BorneBryan K Bartle, MD;  [facility];  Service: Open Heart Surgery;  Laterality: N/A;  . TUBAL LIGATION       Allergies  Allergen Reactions  . Penicillins Itching and Rash    Has patient had a PCN reaction causing immediate rash, facial/tongue/throat swelling, SOB or lightheadedness with hypotension: Yes Has patient had a PCN reaction causing severe rash involving mucus membranes or skin necrosis: Yes Has patient had a PCN reaction that required hospitalization Yes Has patient had a PCN reaction occurring  within the last 10 years: No If all of the above answers are "NO", then may proceed with Cephalosporin use.       Family History  Problem Relation Age of Onset  . Diabetes Father      Social History Ms. Riebe reports that she quit smoking about 3 months ago. Her smoking use included Cigarettes. She has a 30.00 pack-year smoking history. She has never used smokeless tobacco. Ms. Karis reports that she drinks alcohol.   Review of Systems CONSTITUTIONAL: No weight loss, fever, chills, weakness or fatigue.  HEENT: Eyes: No visual loss, blurred vision,  double vision or yellow sclerae.No hearing loss, sneezing, congestion, runny nose or sore throat.  SKIN: No rash or itching.  CARDIOVASCULAR: per hpi RESPIRATORY: No shortness of breath, cough or sputum.  GASTROINTESTINAL: No anorexia, nausea, vomiting or diarrhea. No abdominal pain or blood.  GENITOURINARY: No burning on urination, no polyuria NEUROLOGICAL: No headache, dizziness, syncope, paralysis, ataxia, numbness or tingling in the extremities. No change in bowel or bladder control.  MUSCULOSKELETAL: No muscle, back pain, joint pain or stiffness.  LYMPHATICS: No enlarged nodes. No history of splenectomy.  PSYCHIATRIC: No history of depression or anxiety.  ENDOCRINOLOGIC: No reports of sweating, cold or heat intolerance. No polyuria or polydipsia.  Marland Kitchen   Physical Examination Vitals:   03/28/17 1028  BP: (!) 168/92  Pulse: 71   Vitals:   03/28/17 1028  Weight: 189 lb (85.7 kg)  Height: 5\' 2"  (1.575 m)    Gen: resting comfortably, no acute distress HEENT: no scleral icterus, pupils equal round and reactive, no palptable cervical adenopathy,  CV: RRR, 2/6 systolic murmur RUSB, no jvd Resp: Clear to auscultation bilaterally GI: abdomen is soft, non-tender, non-distended, normal bowel sounds, no hepatosplenomegaly MSK: extremities are warm, no edema.  Skin: warm, no rash Neuro:  no focal deficits Psych: appropriate affect   Diagnostic Studies  12/2016 cath  Ost RCA to Prox RCA lesion, 60 %stenosed.  Ost LM lesion, 95 %stenosed.  Prox LAD to Mid LAD lesion, 70 %stenosed.  The left ventricular systolic function is normal.  LV end diastolic pressure is normal.  The left ventricular ejection fraction is 55-65% by visual estimate.   Acute coronary syndrome with greater than 95% ostial left main stenosis, 70% proximal LAD stenosis, normal left circumflex coronary artery, and 60% very proximal RCA stenosis in a small RCA vessel.  Preserved global LV function with an  ejection fraction at 55% and only a small region of subtle mild upper mid anterolateral hypocontractility.  The patient's transient ECG changes were concordant with left main disease and diffuse global ischemia.  Post cath RECOMMENDATION: Patient was evaluated by Dr. Laneta Simmers will be transported directly from the cardiac catheterization laboratory to undergo emergent CABG revascularization surgery.  12/2016 TEE  Left ventricle: LV systolic function is low normal with an EF of 50-55%.  Aortic valve: No regurgitation.  Mitral valve: Trace regurgitation   Assessment and Plan  1. CAD - s/p recent CABG, doing well - we will refer to cardiac rehab.  - obtain echo s/p MI - lower lopressor to 12.5mg  bid in setting of fatigue  2. HTN - elevated in clinic, has not taken meds yet today.  - she will f/u in 1 week for nursing visit  F/u 6 months      Antoine Poche, M.D.

## 2017-04-03 ENCOUNTER — Ambulatory Visit (HOSPITAL_COMMUNITY)
Admission: RE | Admit: 2017-04-03 | Discharge: 2017-04-03 | Disposition: A | Discharge status: Home / Self Care | Payer: BLUE CROSS/BLUE SHIELD | Source: Ambulatory Visit | Attending: Cardiology | Admitting: Cardiology

## 2017-04-03 ENCOUNTER — Ambulatory Visit (INDEPENDENT_AMBULATORY_CARE_PROVIDER_SITE_OTHER): Payer: BLUE CROSS/BLUE SHIELD | Admitting: *Deleted

## 2017-04-03 VITALS — BP 146/72 | HR 59 | Ht 63.0 in | Wt 192.0 lb

## 2017-04-03 DIAGNOSIS — I10 Essential (primary) hypertension: Secondary | ICD-10-CM

## 2017-04-03 DIAGNOSIS — I25118 Atherosclerotic heart disease of native coronary artery with other forms of angina pectoris: Secondary | ICD-10-CM | POA: Diagnosis not present

## 2017-04-03 NOTE — Progress Notes (Signed)
Patient presents to office for nurse BP check. Patient states that she started all of her medications 2 days ago on Tuesday, Apr 01, 2017. Medications reconciled during visit. Patient has taken all doses of medications since 04/01/17 without c/o side effects, no c/o dizziness, chest pain or sob. Patient alert and oriented x's 4.

## 2017-04-03 NOTE — Progress Notes (Signed)
*  PRELIMINARY RESULTS* Echocardiogram 2D Echocardiogram has been performed.  Jeryl Columbialliott, Bubba Vanbenschoten 04/03/2017, 4:03 PM

## 2017-04-07 MED ORDER — LISINOPRIL 10 MG PO TABS: 10.0000 mg | ORAL_TABLET | Freq: Every day | ORAL | 6 refills | Status: DC

## 2017-04-07 NOTE — Addendum Note (Signed)
Addended by: Lesle ChrisHILL, ANGELA G on: 04/07/2017 05:24 PM   Modules accepted: Orders

## 2017-04-07 NOTE — Progress Notes (Signed)
Patient notified.  She will increase her Lisinopril to 10mg  daily.  She will double up on her 5mg  tablet till finish [facility] supply [facility]  Lab order mailed to home.  [facility] [place].

## 2017-04-07 NOTE — Progress Notes (Signed)
BP mildly elevated at nursing visit. Please increase lisionpril to 10mg  daily, BMET in 2 weeks. I think this change will get her where she needs to be  Dominga FerryJ Diane Hanel MD

## 2017-04-14 ENCOUNTER — Encounter (HOSPITAL_COMMUNITY): Payer: Self-pay

## 2017-04-14 ENCOUNTER — Encounter (HOSPITAL_COMMUNITY)
Admission: RE | Admit: 2017-04-14 | Discharge: 2017-04-14 | Disposition: A | Discharge status: Home / Self Care | Payer: BLUE CROSS/BLUE SHIELD | Source: Ambulatory Visit | Attending: Cardiology | Admitting: Cardiology

## 2017-04-14 VITALS — BP 126/74 | HR 57 | Ht 62.0 in | Wt 194.0 lb

## 2017-04-14 DIAGNOSIS — I214 Non-ST elevation (NSTEMI) myocardial infarction: Secondary | ICD-10-CM | POA: Diagnosis present

## 2017-04-14 DIAGNOSIS — Z48812 Encounter for surgical aftercare following surgery on the circulatory system: Secondary | ICD-10-CM | POA: Insufficient documentation

## 2017-04-14 DIAGNOSIS — Z951 Presence of aortocoronary bypass graft: Secondary | ICD-10-CM | POA: Diagnosis present

## 2017-04-14 NOTE — Progress Notes (Signed)
Cardiac/Pulmonary Rehab Medication Review by a Pharmacist  Does the patient  feel that his/her medications are working for him/her?  yes  Has the patient been experiencing any side effects to the medications prescribed?  no  Does the patient measure his/her own blood pressure or blood glucose at home?  Occasionally   Does the patient have any problems obtaining medications due to transportation or finances?   no  Understanding of regimen: excellent Understanding of indications: good Potential of compliance: excellent  Questions asked to Determine Patient Understanding of Medication Regimen:  1. What is the name of the medication?  2. What is the medication used for?  3. When should it be taken?  4. How much should be taken?  5. How will you take it?  6. What side effects should you report?  Understanding Defined as: Excellent: All questions above are correct Good: Questions 1-4 are correct Fair: Questions 1-2 are correct  Poor: 1 or none of the above questions are correct   Pharmacist comments: 51 yo female presented to cardiac rehab. Reviewed her current medication regimen and patient is compliant and understands indications.  She did have some have some problems with being very sleepy and no energy. She is now tolerating her medications since the metoprolol dose was changed to half a tab(12.5mg ) twice daily and her energy level has increased. [facility], I recommended checking with Pharmacist at [facility] about purchasing a BP monitor that she could monitor hers more frequently at home.  Thanks for the opportunity to participate in the care of this patient,  Elder CyphersLorie Reene Harlacher, BS Loura Backharm D, [place] Clinical Pharmacist Pager [phone] 04/14/2017 1:42 PM

## 2017-04-14 NOTE — Progress Notes (Signed)
Daily Session Note  Patient Details  Name: Cindy Garrett MRN: [id number] Date of Birth: Feb 22, 1966 Referring Provider:     CARDIAC REHAB PHASE II ORIENTATION from 04/14/2017 in [place]  Referring Provider  Dr. Harl Bowie      Encounter Date: 04/14/2017  Check In:     Session Check In - 04/14/17 1230      Check-In   Location [facility]   Staff Present Diane Angelina Pih, MS, EP, [facility], Exercise Physiologist;Gregory Luther Parody, BS, EP, Exercise Physiologist;Eulalah Rupert Wynetta Emery, RN, BSN   Supervising physician immediately available to respond to emergencies See telemetry face sheet for immediately available MD   Medication changes reported     No   Fall or balance concerns reported    No   Tobacco Cessation --  Former smoker. Quit 12/10/2016. Using regular gum to help with cessation. She smoked 1 ppd/30 pack years.    Warm-up and Cool-down --  Performed as individual activity.   Resistance Training Performed Yes   VAD Patient? No     Pain Assessment   Currently in Pain? No/denies   Pain Score 0-No pain   Multiple Pain Sites No      Capillary Blood Glucose: No results found for this or any previous visit (from the past 24 hour(s)).    History  Smoking Status  . Former Smoker  . Packs/day: 1.00  . Years: 30.00  . Types: Cigarettes  . Quit date: 12/10/2016  Smokeless Tobacco  . Never Used    Goals Met:  Proper associated with RPD/PD & O2 Sat Exercise tolerated well Personal goals reviewed No report of cardiac concerns or symptoms Strength training completed today  Goals Unmet:  Not Applicable  Comments: Check out 1445.   Dr. Kate Sable is Medical Director for [facility] Cardiac and Pulmonary Rehab.

## 2017-04-14 NOTE — Progress Notes (Signed)
Cardiac Individual Treatment Plan  Patient Details  Name: Cindy SabalFrances Rigaud MRN: [id number] Date of Birth: 01/24/1966 Referring Provider:     CARDIAC REHAB PHASE [facility]ATION [facility]  Referring Provider  Dr. Wyline MoodBranch      Initial Encounter Date:    CARDIAC REHAB PHASE II ORIENTATION [facility]14/2017 [facility]  Date  04/14/17  Referring Provider  Dr. Wyline MoodBranch      Visit Diagnosis: NSTEMI (non-ST elevated myocardial infarction) (HCC)  S/P CABG x 3  Patient's Home Medications on Admission:  Current Outpatient Prescriptions:  .  acetaminophen (TYLENOL) 325 MG tablet, Take 2 tablets (650 mg total) by mouth every 6 (six) hours as needed for mild pain., Disp: , Rfl:  .  aspirin EC 81 MG EC tablet, Take 1 tablet (81 mg total) by mouth daily., Disp: , Rfl:  .  atorvastatin (LIPITOR) 80 MG tablet, Take 1 tablet (80 mg total) by mouth daily at 6 PM., Disp: 30 tablet, Rfl: 3 .  clopidogrel (PLAVIX) 75 MG tablet, Take 1 tablet (75 mg total) by mouth daily., Disp: 30 tablet, Rfl: 3 .  lisinopril (PRINIVIL,ZESTRIL) 10 MG tablet, Take 1 tablet (10 mg total) by mouth daily., Disp: 30 tablet, Rfl: 6 .  metoprolol tartrate (LOPRESSOR) 25 MG tablet, Take 0.5 tablets (12.5 mg total) by mouth 2 (two) times daily., Disp: 60 tablet, Rfl: 3 .  oxyCODONE (OXY IR/ROXICODONE) 5 MG immediate release tablet, Take 1-2 tablets (5-10 mg total) by mouth every 3 (three) hours as needed for severe pain. (Patient not taking: Reported on 04/14/2017), Disp: 30 tablet, Rfl: 0  Past Medical History: Past Medical History:  Diagnosis Date  . Stroke [facility](HCC)     Tobacco Use: History  Smoking Status  . Former Smoker  . Packs/day: 1.00  . Years: 30.00  . Types: Cigarettes  . Quit date: 12/10/2016  Smokeless Tobacco  . Never Used    Labs: Recent Review Flowsheet Data    Labs for ITP Cardiac and Pulmonary Rehab Latest Ref Rng & Units 12/11/2016 12/11/2016  12/11/2016 12/11/2016 12/12/2016   Cholestrol 0 - 200 mg/dL - - - - 409123   LDLCALC 0 - 99 mg/dL - - - - 59   HDL >81>40 mg/dL - - - - 19(J25(L)   Trlycerides <150 mg/dL - - - - 478(G195(H)   PHART 7.350 - 7.450 7.275(L) 7.324(L) 7.306(L) - -   PCO2ART 32.0 - 48.0 mmHg 52.0(H) 48.5(H) 46.4 - -   HCO3 20.0 - 28.0 mmol/L 24.3 25.4 23.3 - -   TCO2 0 - 100 mmol/L 26 27 25 27  -   ACIDBASEDEF 0.0 - 2.0 mmol/L 3.0(H) 1.0 3.0(H) - -   O2SAT % 100.0 97.0 99.0 - -      Capillary Blood Glucose: Lab Results  Component Value Date   GLUCAP 118 (H) 12/12/2016   GLUCAP 123 (H) 12/12/2016   GLUCAP 122 (H) 12/11/2016   GLUCAP 118 (H) 12/11/2016   GLUCAP 131 (H) 12/11/2016     Exercise Target Goals: Date: 04/14/17  Exercise Program Goal: Individual exercise prescription set with THRR, safety & activity barriers. Participant demonstrates ability to understand and report RPE using BORG scale, to self-measure pulse accurately, and to acknowledge the importance of the exercise prescription.  Exercise Prescription Goal: Starting with aerobic activity 30 plus minutes a day, 3 days per week for initial exercise prescription. Provide home exercise prescription and guidelines that participant acknowledges understanding prior to discharge.  Activity Barriers & Risk Stratification:   6 Minute Walk:     6 Minute Walk    Row Name 04/14/17 1439         6 Minute Walk   Phase Initial     Distance 1550 feet     Distance % Change 0 %     Walk Time 6 minutes     # of Rest Breaks 0     MPH 2.93     METS 3.25     RPE 9     Perceived Dyspnea  6     VO2 Peak 13.11     Symptoms No     Resting HR 57 bpm     Resting BP 126/74     Max Ex. HR 86 bpm     Max Ex. BP 140/76     2 Minute Post BP 128/74        Oxygen Initial Assessment:   Oxygen Re-Evaluation:   Oxygen Discharge (Final Oxygen Re-Evaluation):   Initial Exercise Prescription:     Initial Exercise Prescription - 04/14/17 1400      Date of  Initial Exercise RX and Referring Provider   Date 04/14/17   Referring Provider Dr. Wyline Mood     NuStep   Level 2   SPM 7   Minutes 15   METs 1.7     Arm Ergometer   Level 1.5   Watts 10   RPM 10   Minutes 20   METs 1.8     Prescription Details   Frequency (times per week) 3   Duration Progress to 30 minutes of continuous aerobic without signs/symptoms of physical distress     Intensity   THRR 40-80% of Max Heartrate [phone]   Ratings of Perceived Exertion 11-13   Perceived Dyspnea 0-4     Progression   Progression Continue progressive overload as per policy without signs/symptoms or physical distress.     Resistance Training   Training Prescription Yes   Weight 1   Reps 10-15      Perform Capillary Blood Glucose checks as needed.  Exercise Prescription Changes:   Exercise Comments:   Exercise Goals and Review:      Exercise Goals    Row Name 04/14/17 1425             Exercise Goals   Increase Physical Activity Yes       Intervention Provide advice, education, support and counseling about physical activity/exercise needs.;Develop an individualized exercise prescription for aerobic and resistive training based on initial evaluation findings, risk stratification, comorbidities and participant's personal goals.       Expected Outcomes Achievement of increased cardiorespiratory fitness and enhanced flexibility, muscular endurance and strength shown through measurements of functional capacity and personal statement of participant.       Increase Strength and Stamina Yes       Intervention Provide advice, education, support and counseling about physical activity/exercise needs.;Develop an individualized exercise prescription for aerobic and resistive training based on initial evaluation findings, risk stratification, comorbidities and participant's personal goals.       Expected Outcomes Achievement of increased cardiorespiratory fitness and enhanced  flexibility, muscular endurance and strength shown through measurements of functional capacity and personal statement of participant.          Exercise Goals Re-Evaluation :    Discharge Exercise Prescription (Final Exercise Prescription Changes):   Nutrition:  Target Goals: Understanding of nutrition guidelines, daily intake of sodium 1500mg , cholesterol 200mg ,  calories 30% from fat and 7% or less from saturated fats, daily to have 5 or more servings of fruits and vegetables.  Biometrics:     Pre Biometrics - 04/14/17 1437      Pre Biometrics   Height 5\' 2"  (1.575 m)   Weight 194 lb 0.1 oz (88 kg)   Waist Circumference 41 inches   Hip Circumference 42.5 inches   Waist to Hip Ratio 0.96 %   BMI (Calculated) 35.6   Triceps Skinfold 17 mm   % Body Fat 41.9 %   Grip Strength 42.67 kg   Flexibility 14.67 in   Single Leg Stand 4 seconds       Nutrition Therapy Plan and Nutrition Goals:   Nutrition Discharge: Rate Your Plate Scores:     Nutrition Assessments - 04/14/17 1420      MEDFICTS Scores   Pre Score 98      Nutrition Goals Re-Evaluation:   Nutrition Goals Discharge (Final Nutrition Goals Re-Evaluation):   Psychosocial: Target Goals: Acknowledge presence or absence of significant depression and/or stress, maximize coping skills, provide positive support system. Participant is able to verbalize types and ability to use techniques and skills needed for reducing stress and depression.  Initial Review & Psychosocial Screening:     Initial Psych Review & Screening - 04/14/17 1450      Initial Review   Current issues with Current Depression     Family Dynamics   Good Support System? Yes   Comments Patient has current depression. QOL was 27.41 and her PHQ-9 was 16. She is not currently being treated. She is interested in counseling. She says she would rather talk to her PCP herself about treatment options.      Barriers   Psychosocial barriers to  participate in program Psychosocial barriers identified (see note)     Screening Interventions   Interventions Encouraged to exercise;Provide feedback about the scores to participant;To provide support and resources with identified psychosocial needs      Quality of Life Scores:     Quality of Life - 04/14/17 1438      Quality of Life Scores   Health/Function Pre 25.2 %   Socioeconomic Pre 27.71 %   Psych/Spiritual Pre 30 %   Family Pre 30 %   GLOBAL Pre 27.41 %      PHQ-9: Recent Review Flowsheet Data    Depression screen [facility] 2/9 04/14/2017   Decreased Interest 1   Down, Depressed, Hopeless 1   PHQ - 2 Score 2   Altered sleeping 3   Tired, decreased energy 3   Change in appetite 3   Feeling bad or failure about yourself  1   Trouble concentrating 3   Moving slowly or fidgety/restless 1   Suicidal thoughts 0   PHQ-9 Score 16   Difficult doing work/chores Very difficult     Interpretation of Total Score  Total Score Depression Severity:  1-4 = Minimal depression, 5-9 = Mild depression, 10-14 = Moderate depression, 15-19 = Moderately severe depression, 20-27 = Severe depression   Psychosocial Evaluation and Intervention:     Psychosocial Evaluation - 04/14/17 1453      Psychosocial Evaluation & Interventions   Interventions Relaxation education;Encouraged to exercise with the program and follow exercise prescription;Stress management education   Comments Patient has current depression. QOL was 27.41 and her PHQ-9 was 16. She is not currently being treated. She is interested in counseling. She says she would rather talk to her PCP herself  about treatment options.   Expected Outcomes Patient will talk to her PCP about treatment for her depression. Staff will f/u.    Continue Psychosocial Services  Follow up required by staff      Psychosocial Re-Evaluation:   Psychosocial Discharge (Final Psychosocial Re-Evaluation):   Vocational Rehabilitation: Provide  vocational rehab assistance to qualifying candidates.   Vocational Rehab Evaluation & Intervention:     Vocational Rehab - 04/14/17 1420      Initial Vocational Rehab Evaluation & Intervention   Assessment shows need for Vocational Rehabilitation No      Education: Education Goals: Education classes will be provided on a weekly basis, covering required topics. Participant will state understanding/return demonstration of topics presented.  Learning Barriers/Preferences:     Learning Barriers/Preferences - 04/14/17 1419      Learning Barriers/Preferences   Learning Barriers None   Learning Preferences Written Material;Pictoral      Education Topics: Hypertension, Hypertension Reduction -Define heart disease and high blood pressure. Discus how high blood pressure affects the body and ways to reduce high blood pressure.   Exercise and Your Heart -Discuss why it is important to exercise, the FITT principles of exercise, normal and abnormal responses to exercise, and how to exercise safely.   Angina -Discuss definition of angina, causes of angina, treatment of angina, and how to decrease risk of having angina.   Cardiac Medications -Review what the following cardiac medications are used for, how they affect the body, and side effects that may occur when taking the medications.  Medications include Aspirin, Beta blockers, calcium channel blockers, ACE Inhibitors, angiotensin receptor blockers, diuretics, digoxin, and antihyperlipidemics.   Congestive Heart Failure -Discuss the definition of CHF, how to live with CHF, the signs and symptoms of CHF, and how keep track of weight and sodium intake.   Heart Disease and Intimacy -Discus the effect sexual activity has on the heart, how changes occur during intimacy as we age, and safety during sexual activity.   Smoking Cessation / COPD -Discuss different methods to quit smoking, the health benefits of quitting smoking, and the  definition of COPD.   Nutrition I: Fats -Discuss the types of cholesterol, what cholesterol does to the heart, and how cholesterol levels can be controlled.   Nutrition II: Labels -Discuss the different components of food labels and how to read food label   Heart Parts and Heart Disease -Discuss the anatomy of the heart, the pathway of blood circulation through the heart, and these are affected by heart disease.   Stress I: Signs and Symptoms -Discuss the causes of stress, how stress may lead to anxiety and depression, and ways to limit stress.   Stress II: Relaxation -Discuss different types of relaxation techniques to limit stress.   Warning Signs of Stroke / TIA -Discuss definition of a stroke, what the signs and symptoms are of a stroke, and how to identify when someone is having stroke.   Knowledge Questionnaire Score:     Knowledge Questionnaire Score - 04/14/17 1448      Knowledge Questionnaire Score   Pre Score 21/24      Core Components/Risk Factors/Patient Goals at Admission:     Personal Goals and Risk Factors at Admission - 04/14/17 1422      Core Components/Risk Factors/Patient Goals on Admission    Weight Management Yes;Obesity   Intervention Weight Management: Develop a combined nutrition and exercise program designed to reach desired caloric intake, while maintaining appropriate intake of nutrient and fiber, sodium and  fats, and appropriate energy expenditure required for the weight goal.;Weight Management: Provide education and appropriate resources to help participant work on and attain dietary goals.;Weight Management/Obesity: Establish reasonable short term and long term weight goals.   Admit Weight 194 lb (88 kg)   Goal Weight: Short Term 189 lb (85.7 kg)   Goal Weight: Long Term 184 lb (83.5 kg)   Stress Yes   Intervention Offer individual and/or small group education and counseling on adjustment to heart disease, stress management and  health-related lifestyle change. Teach and support self-help strategies.;Refer participants experiencing significant psychosocial distress to appropriate mental health specialists for further evaluation and treatment. When possible, include family members and significant others in education/counseling sessions.   Expected Outcomes Short Term: Participant demonstrates changes in health-related behavior, relaxation and other stress management skills, ability to obtain effective social support, and compliance with psychotropic medications if prescribed.;Long Term: Emotional wellbeing is indicated by absence of clinically significant psychosocial distress or social isolation.   Personal Goal Other Yes   Personal Goal Wants to feel better; get stronger; have more energy. Eat healthier.    Intervention Patient will attend CR 3 days/week and supplement with exercise at home 2 days/week.    Expected Outcomes Patient will meet her personal goals.       Core Components/Risk Factors/Patient Goals Review:    Core Components/Risk Factors/Patient Goals at Discharge (Final Review):    ITP Comments:   Comments: Patient arrived for 1st visit/orientation/education at 1230. Patient was referred to CR by Branch due to NSTEMI (I121.4) and CABGX3 (Z95.1). During orientation advised patient on arrival and appointment times what to wear, what to do before, during and after exercise. Reviewed attendance and class policy. Talked about inclement weather and class consultation policy. Pt is scheduled to return Cardiac Rehab on 04/21/17 at 9:30. Pt was advised to come to class 15 minutes before class starts. Patient was also given instructions on meeting with the dietician and attending the Family Structure classes. Pt is eager to get started. Patient participated in warm up stretches followed by light weights and resistance bands. Patient was able to complete 6 minute walk test. She c/o bilateral knee pain 4/10 during walk  test. Pain subsided after 2 minute rest 0/10. Patient was measured for the equipment. Discussed equipment safety with patient. Took patient pre-anthropometric measurements. Patient finished visit at 1445.

## 2017-04-21 ENCOUNTER — Encounter (HOSPITAL_COMMUNITY)
Admission: RE | Admit: 2017-04-21 | Discharge: 2017-04-21 | Disposition: A | Discharge status: Home / Self Care | Payer: BLUE CROSS/BLUE SHIELD | Source: Ambulatory Visit | Attending: Cardiology | Admitting: Cardiology

## 2017-04-21 DIAGNOSIS — Z48812 Encounter for surgical aftercare following surgery on the circulatory system: Secondary | ICD-10-CM | POA: Diagnosis not present

## 2017-04-21 DIAGNOSIS — I214 Non-ST elevation (NSTEMI) myocardial infarction: Secondary | ICD-10-CM

## 2017-04-21 DIAGNOSIS — Z951 Presence of aortocoronary bypass graft: Secondary | ICD-10-CM

## 2017-04-21 NOTE — Progress Notes (Signed)
Daily Session Note  Patient Details  Name: Cindy Garrett MRN: [id number] Date of Birth: 02/02/1966 Referring Provider:     CARDIAC REHAB PHASE II ORIENTATION from 04/14/2017 in [place] [facility]  Referring Provider  Dr. Branch      Encounter Date: 04/21/2017  Check In:     Session Check In - 04/21/17 0930      Check-In   Location [facility]   Staff Present Diane Coad, MS, EP, CHC, Exercise Physiologist;Debra Johnson, RN, BSN;Gregory Cowan, BS, EP, Exercise Physiologist   Supervising physician immediately available to respond to emergencies See telemetry face sheet for immediately available MD   Medication changes reported     No   Fall or balance concerns reported    No   Tobacco Cessation No Change   Warm-up and Cool-down Performed as group-led instruction   Resistance Training Performed Yes   VAD Patient? No     Pain Assessment   Currently in Pain? No/denies   Pain Score 0-No pain   Multiple Pain Sites No      Capillary Blood Glucose: No results found for this or any previous visit (from the past 24 hour(s)).    History  Smoking Status  . Former Smoker  . Packs/day: 1.00  . Years: 30.00  . Types: Cigarettes  . Quit date: 12/10/2016  Smokeless Tobacco  . Never Used    Goals Met:  Independence with exercise equipment Exercise tolerated well No report of cardiac concerns or symptoms Strength training completed today  Goals Unmet:  Not Applicable  Comments: Check out 1030.   Dr. Suresh Koneswaran is Medical Director for [facility]. 

## 2017-04-21 NOTE — Progress Notes (Signed)
Cardiac Individual Treatment Plan  Patient Details  Name: Cindy SabalFrances Gittings MRN: [id number] Date of Birth: 06/10/1966 Referring Provider:     CARDIAC REHAB PHASE [facility]  Referring Provider  Dr. Wyline MoodBranch      Initial Encounter Date:    CARDIAC REHAB PHASE II [facility]  Date  04/14/17  Referring Provider  Dr. Wyline MoodBranch      Visit Diagnosis: NSTEMI (non-ST elevated myocardial infarction) (HCC)  S/P CABG x 3  Patient's Home Medications on Admission:  Current Outpatient Prescriptions:  .  acetaminophen (TYLENOL) 325 MG tablet, Take 2 tablets (650 mg total) by mouth every 6 (six) hours as needed for mild pain., Disp: , Rfl:  .  aspirin EC 81 MG EC tablet, Take 1 tablet (81 mg total) by mouth daily., Disp: , Rfl:  .  atorvastatin (LIPITOR) 80 MG tablet, Take 1 tablet (80 mg total) by mouth daily at 6 PM., Disp: 30 tablet, Rfl: 3 .  clopidogrel (PLAVIX) 75 MG tablet, Take 1 tablet (75 mg total) by mouth daily., Disp: 30 tablet, Rfl: 3 .  lisinopril (PRINIVIL,ZESTRIL) 10 MG tablet, Take 1 tablet (10 mg total) by mouth daily., Disp: 30 tablet, Rfl: 6 .  metoprolol tartrate (LOPRESSOR) 25 MG tablet, Take 0.5 tablets (12.5 mg total) by mouth 2 (two) times daily., Disp: 60 tablet, Rfl: 3 .  oxyCODONE (OXY IR/ROXICODONE) 5 MG immediate release tablet, Take 1-2 tablets (5-10 mg total) by mouth every 3 (three) hours as needed for severe pain. (Patient not taking: Reported on 04/14/2017), Disp: 30 tablet, Rfl: 0  Past Medical History: Past Medical History:  Diagnosis Date  . Stroke [facility](HCC)     Tobacco Use: History  Smoking Status  . Former Smoker  . Packs/day: 1.00  . Years: 30.00  . Types: Cigarettes  . Quit date: 12/10/2016  Smokeless Tobacco  . Never Used    Labs: Recent Review Flowsheet Data    Labs for ITP Cardiac and Pulmonary Rehab Latest Ref Rng & Units 12/11/2016 12/11/2016  12/11/2016 12/11/2016 12/12/2016   Cholestrol 0 - 200 mg/dL - - - - 409123   LDLCALC 0 - 99 mg/dL - - - - 59   HDL >81>40 mg/dL - - - - 19(J25(L)   Trlycerides <150 mg/dL - - - - 478(G195(H)   PHART 7.350 - 7.450 7.275(L) 7.324(L) 7.306(L) - -   PCO2ART 32.0 - 48.0 mmHg 52.0(H) 48.5(H) 46.4 - -   HCO3 20.0 - 28.0 mmol/L 24.3 25.4 23.3 - -   TCO2 0 - 100 mmol/L 26 27 25 27  -   ACIDBASEDEF 0.0 - 2.0 mmol/L 3.0(H) 1.0 3.0(H) - -   O2SAT % 100.0 97.0 99.0 - -      Capillary Blood Glucose: Lab Results  Component Value Date   GLUCAP 118 (H) 12/12/2016   GLUCAP 123 (H) 12/12/2016   GLUCAP 122 (H) 12/11/2016   GLUCAP 118 (H) 12/11/2016   GLUCAP 131 (H) 12/11/2016     Exercise Target Goals:    Exercise Program Goal: Individual exercise prescription set with THRR, safety & activity barriers. Participant demonstrates ability to understand and report RPE using BORG scale, to self-measure pulse accurately, and to acknowledge the importance of the exercise prescription.  Exercise Prescription Goal: Starting with aerobic activity 30 plus minutes a day, 3 days per week for initial exercise prescription. Provide home exercise prescription and guidelines that participant acknowledges understanding prior to discharge.  Activity Barriers & Risk Stratification:   6 Minute Walk:     6 Minute Walk    Row Name 04/14/17 1439         6 Minute Walk   Phase Initial     Distance 1550 feet     Distance % Change 0 %     Walk Time 6 minutes     # of Rest Breaks 0     MPH 2.93     METS 3.25     RPE 9     Perceived Dyspnea  6     VO2 Peak 13.11     Symptoms No     Resting HR 57 bpm     Resting BP 126/74     Max Ex. HR 86 bpm     Max Ex. BP 140/76     2 Minute Post BP 128/74        Oxygen Initial Assessment:   Oxygen Re-Evaluation:   Oxygen Discharge (Final Oxygen Re-Evaluation):   Initial Exercise Prescription:     Initial Exercise Prescription - 04/14/17 1400      Date of Initial Exercise RX  and Referring Provider   Date 04/14/17   Referring Provider Dr. Wyline Mood     NuStep   Level 2   SPM 7   Minutes 15   METs 1.7     Arm Ergometer   Level 1.5   Watts 10   RPM 10   Minutes 20   METs 1.8     Prescription Details   Frequency (times per week) 3   Duration Progress to 30 minutes of continuous aerobic without signs/symptoms of physical distress     Intensity   THRR 40-80% of Max Heartrate [phone]   Ratings of Perceived Exertion 11-13   Perceived Dyspnea 0-4     Progression   Progression Continue progressive overload as per policy without signs/symptoms or physical distress.     Resistance Training   Training Prescription Yes   Weight 1   Reps 10-15      Perform Capillary Blood Glucose checks as needed.  Exercise Prescription Changes:   Exercise Comments:      Exercise Comments    Row Name 04/17/17 1543           Exercise Comments Patient has just started CR and will be progressed in time.           Exercise Goals and Review:      Exercise Goals    Row Name 04/14/17 1425             Exercise Goals   Increase Physical Activity Yes       Intervention Provide advice, education, support and counseling about physical activity/exercise needs.;Develop an individualized exercise prescription for aerobic and resistive training based on initial evaluation findings, risk stratification, comorbidities and participant's personal goals.       Expected Outcomes Achievement of increased cardiorespiratory fitness and enhanced flexibility, muscular endurance and strength shown through measurements of functional capacity and personal statement of participant.       Increase Strength and Stamina Yes       Intervention Provide advice, education, support and counseling about physical activity/exercise needs.;Develop an individualized exercise prescription for aerobic and resistive training based on initial evaluation findings, risk stratification,  comorbidities and participant's personal goals.       Expected Outcomes Achievement of increased cardiorespiratory fitness and enhanced flexibility, muscular endurance and strength shown through measurements of functional capacity  and personal statement of participant.          Exercise Goals Re-Evaluation :    Discharge Exercise Prescription (Final Exercise Prescription Changes):   Nutrition:  Target Goals: Understanding of nutrition guidelines, daily intake of sodium 1500mg , cholesterol 200mg , calories 30% from fat and 7% or less from saturated fats, daily to have 5 or more servings of fruits and vegetables.  Biometrics:     Pre Biometrics - 04/14/17 1437      Pre Biometrics   Height 5\' 2"  (1.575 m)   Weight 194 lb 0.1 oz (88 kg)   Waist Circumference 41 inches   Hip Circumference 42.5 inches   Waist to Hip Ratio 0.96 %   BMI (Calculated) 35.6   Triceps Skinfold 17 mm   % Body Fat 41.9 %   Grip Strength 42.67 kg   Flexibility 14.67 in   Single Leg Stand 4 seconds       Nutrition Therapy Plan and Nutrition Goals:   Nutrition Discharge: Rate Your Plate Scores:     Nutrition Assessments - 04/14/17 1420      MEDFICTS Scores   Pre Score 98      Nutrition Goals Re-Evaluation:   Nutrition Goals Discharge (Final Nutrition Goals Re-Evaluation):   Psychosocial: Target Goals: Acknowledge presence or absence of significant depression and/or stress, maximize coping skills, provide positive support system. Participant is able to verbalize types and ability to use techniques and skills needed for reducing stress and depression.  Initial Review & Psychosocial Screening:     Initial Psych Review & Screening - 04/14/17 1450      Initial Review   Current issues with Current Depression     Family Dynamics   Good Support System? Yes   Comments Patient has current depression. QOL was 27.41 and her PHQ-9 was 16. She is not currently being treated. She is interested in  counseling. She says she would rather talk to her PCP herself about treatment options.      Barriers   Psychosocial barriers to participate in program Psychosocial barriers identified (see note)     Screening Interventions   Interventions Encouraged to exercise;Provide feedback about the scores to participant;To provide support and resources with identified psychosocial needs      Quality of Life Scores:     Quality of Life - 04/14/17 1438      Quality of Life Scores   Health/Function Pre 25.2 %   Socioeconomic Pre 27.71 %   Psych/Spiritual Pre 30 %   Family Pre 30 %   GLOBAL Pre 27.41 %      PHQ-9: Recent Review Flowsheet Data    Depression screen [facility] 2/9 04/14/2017   Decreased Interest 1   Down, Depressed, Hopeless 1   PHQ - 2 Score 2   Altered sleeping 3   Tired, decreased energy 3   Change in appetite 3   Feeling bad or failure about yourself  1   Trouble concentrating 3   Moving slowly or fidgety/restless 1   Suicidal thoughts 0   PHQ-9 Score 16   Difficult doing work/chores Very difficult     Interpretation of Total Score  Total Score Depression Severity:  1-4 = Minimal depression, 5-9 = Mild depression, 10-14 = Moderate depression, 15-19 = Moderately severe depression, 20-27 = Severe depression   Psychosocial Evaluation and Intervention:     Psychosocial Evaluation - 04/14/17 1453      Psychosocial Evaluation & Interventions   Interventions Relaxation education;Encouraged to exercise  with the program and follow exercise prescription;Stress management education   Comments Patient has current depression. QOL was 27.41 and her PHQ-9 was 16. She is not currently being treated. She is interested in counseling. She says she would rather talk to her PCP herself about treatment options.   Expected Outcomes Patient will talk to her PCP about treatment for her depression. Staff will f/u.    Continue Psychosocial Services  Follow up required by staff       Psychosocial Re-Evaluation:   Psychosocial Discharge (Final Psychosocial Re-Evaluation):   Vocational Rehabilitation: Provide vocational rehab assistance to qualifying candidates.   Vocational Rehab Evaluation & Intervention:     Vocational Rehab - 04/14/17 1420      Initial Vocational Rehab Evaluation & Intervention   Assessment shows need for Vocational Rehabilitation No      Education: Education Goals: Education classes will be provided on a weekly basis, covering required topics. Participant will state understanding/return demonstration of topics presented.  Learning Barriers/Preferences:     Learning Barriers/Preferences - 04/14/17 1419      Learning Barriers/Preferences   Learning Barriers None   Learning Preferences Written Material;Pictoral      Education Topics: Hypertension, Hypertension Reduction -Define heart disease and high blood pressure. Discus how high blood pressure affects the body and ways to reduce high blood pressure.   Exercise and Your Heart -Discuss why it is important to exercise, the FITT principles of exercise, normal and abnormal responses to exercise, and how to exercise safely.   Angina -Discuss definition of angina, causes of angina, treatment of angina, and how to decrease risk of having angina.   Cardiac Medications -Review what the following cardiac medications are used for, how they affect the body, and side effects that may occur when taking the medications.  Medications include Aspirin, Beta blockers, calcium channel blockers, ACE Inhibitors, angiotensin receptor blockers, diuretics, digoxin, and antihyperlipidemics.   Congestive Heart Failure -Discuss the definition of CHF, how to live with CHF, the signs and symptoms of CHF, and how keep track of weight and sodium intake.   Heart Disease and Intimacy -Discus the effect sexual activity has on the heart, how changes occur during intimacy as we age, and safety during  sexual activity.   Smoking Cessation / COPD -Discuss different methods to quit smoking, the health benefits of quitting smoking, and the definition of COPD.   Nutrition I: Fats -Discuss the types of cholesterol, what cholesterol does to the heart, and how cholesterol levels can be controlled.   Nutrition II: Labels -Discuss the different components of food labels and how to read food label   Heart Parts and Heart Disease -Discuss the anatomy of the heart, the pathway of blood circulation through the heart, and these are affected by heart disease.   Stress I: Signs and Symptoms -Discuss the causes of stress, how stress may lead to anxiety and depression, and ways to limit stress.   Stress II: Relaxation -Discuss different types of relaxation techniques to limit stress.   Warning Signs of Stroke / TIA -Discuss definition of a stroke, what the signs and symptoms are of a stroke, and how to identify when someone is having stroke.   Knowledge Questionnaire Score:     Knowledge Questionnaire Score - 04/14/17 1448      Knowledge Questionnaire Score   Pre Score 21/24      Core Components/Risk Factors/Patient Goals at Admission:     Personal Goals and Risk Factors at Admission - 04/14/17 1422  Core Components/Risk Factors/Patient Goals on Admission    Weight Management Yes;Obesity   Intervention Weight Management: Develop a combined nutrition and exercise program designed to reach desired caloric intake, while maintaining appropriate intake of nutrient and fiber, sodium and fats, and appropriate energy expenditure required for the weight goal.;Weight Management: Provide education and appropriate resources to help participant work on and attain dietary goals.;Weight Management/Obesity: Establish reasonable short term and long term weight goals.   Admit Weight 194 lb (88 kg)   Goal Weight: Short Term 189 lb (85.7 kg)   Goal Weight: Long Term 184 lb (83.5 kg)   Stress Yes    Intervention Offer individual and/or small group education and counseling on adjustment to heart disease, stress management and health-related lifestyle change. Teach and support self-help strategies.;Refer participants experiencing significant psychosocial distress to appropriate mental health specialists for further evaluation and treatment. When possible, include family members and significant others in education/counseling sessions.   Expected Outcomes Short Term: Participant demonstrates changes in health-related behavior, relaxation and other stress management skills, ability to obtain effective social support, and compliance with psychotropic medications if prescribed.;Long Term: Emotional wellbeing is indicated by absence of clinically significant psychosocial distress or social isolation.   Personal Goal Other Yes   Personal Goal Wants to feel better; get stronger; have more energy. Eat healthier.    Intervention Patient will attend CR 3 days/week and supplement with exercise at home 2 days/week.    Expected Outcomes Patient will meet her personal goals.       Core Components/Risk Factors/Patient Goals Review:    Core Components/Risk Factors/Patient Goals at Discharge (Final Review):    ITP Comments:     ITP Comments    Row Name 04/21/17 1008           ITP Comments Patient new to program. Started today 04/21/17. Will continue to monitor for progress.           Comments: ITP 30 Day REVIEW Patient new to program. Started today 04/21/17. Will continue to monitor for progress.

## 2017-04-23 ENCOUNTER — Encounter (HOSPITAL_COMMUNITY)
Admission: RE | Admit: 2017-04-23 | Discharge: 2017-04-23 | Disposition: A | Discharge status: Home / Self Care | Payer: BLUE CROSS/BLUE SHIELD | Source: Ambulatory Visit | Attending: Cardiology | Admitting: Cardiology

## 2017-04-23 DIAGNOSIS — I214 Non-ST elevation (NSTEMI) myocardial infarction: Secondary | ICD-10-CM

## 2017-04-23 DIAGNOSIS — Z48812 Encounter for surgical aftercare following surgery on the circulatory system: Secondary | ICD-10-CM | POA: Diagnosis not present

## 2017-04-23 DIAGNOSIS — Z951 Presence of aortocoronary bypass graft: Secondary | ICD-10-CM

## 2017-04-23 NOTE — Progress Notes (Signed)
Daily Session Note  Patient Details  Name: Verneta Hamidi MRN: [id number] Date of Birth: 1965-12-27 Referring Provider:     CARDIAC REHAB PHASE II ORIENTATION from 04/14/2017 in [place]  Referring Provider  Dr. Harl Bowie      Encounter Date: 04/23/2017  Check In:     Session Check In - 04/23/17 0930      Check-In   Location [facility]   Staff Present Diane Angelina Pih, MS, EP, [facility], Exercise Physiologist;Penni Penado Wynetta Emery, RN, BSN;Gregory Cowan, BS, EP, Exercise Physiologist   Supervising physician immediately available to respond to emergencies See telemetry face sheet for immediately available MD   Medication changes reported     No   Fall or balance concerns reported    No   Tobacco Cessation No Change   Warm-up and Cool-down Performed as group-led instruction   Resistance Training Performed Yes   VAD Patient? No     Pain Assessment   Currently in Pain? No/denies   Pain Score 0-No pain   Multiple Pain Sites No      Capillary Blood Glucose: No results found for this or any previous visit (from the past 24 hour(s)).    History  Smoking Status  . Former Smoker  . Packs/day: 1.00  . Years: 30.00  . Types: Cigarettes  . Quit date: 12/10/2016  Smokeless Tobacco  . Never Used    Goals Met:  Independence with exercise equipment Exercise tolerated well No report of cardiac concerns or symptoms Strength training completed today  Goals Unmet:  Not Applicable  Comments: Check out 1030.   Dr. Kate Sable is Medical Director for [facility] Cardiac and Pulmonary Rehab.

## 2017-04-25 ENCOUNTER — Encounter (HOSPITAL_COMMUNITY)
Admission: RE | Admit: 2017-04-25 | Discharge: 2017-04-25 | Disposition: A | Discharge status: Home / Self Care | Payer: BLUE CROSS/BLUE SHIELD | Source: Ambulatory Visit | Attending: Cardiology | Admitting: Cardiology

## 2017-04-25 DIAGNOSIS — I214 Non-ST elevation (NSTEMI) myocardial infarction: Secondary | ICD-10-CM

## 2017-04-25 DIAGNOSIS — Z48812 Encounter for surgical aftercare following surgery on the circulatory system: Secondary | ICD-10-CM | POA: Diagnosis not present

## 2017-04-25 DIAGNOSIS — Z951 Presence of aortocoronary bypass graft: Secondary | ICD-10-CM

## 2017-04-25 NOTE — Progress Notes (Signed)
Daily Session Note  Patient Details  Name: Cindy Garrett MRN: [id number] Date of Birth: [birth year] Referring Provider:     CARDIAC REHAB PHASE II ORIENTATION from 04/14/2017 in [place]  Referring Provider  Dr. Harl Bowie      Encounter Date: 04/25/2017  Check In:     Session Check In - 04/25/17 0930      Check-In   Location [facility]   Staff Present Diane Angelina Pih, MS, EP, [facility], Exercise Physiologist;Jacobus Colvin Wynetta Emery, RN, BSN   Supervising physician immediately available to respond to emergencies See telemetry face sheet for immediately available MD   Medication changes reported     No   Fall or balance concerns reported    No   Tobacco Cessation No Change   Warm-up and Cool-down Performed as group-led instruction   Resistance Training Performed Yes   VAD Patient? No     Pain Assessment   Currently in Pain? No/denies   Pain Score 0-No pain   Multiple Pain Sites No      Capillary Blood Glucose: No results found for this or any previous visit (from the past 24 hour(s)).    History  Smoking Status  . Former Smoker  . Packs/day: 1.00  . Years: 30.00  . Types: Cigarettes  . Quit date: 12/10/2016  Smokeless Tobacco  . Never Used    Goals Met:  Independence with exercise equipment Exercise tolerated well No report of cardiac concerns or symptoms Strength training completed today  Goals Unmet:  Not Applicable  Comments: Check out 1030.   Dr. Kate Sable is Medical Director for [facility] Cardiac and Pulmonary Rehab.

## 2017-04-28 ENCOUNTER — Encounter (HOSPITAL_COMMUNITY): Payer: BLUE CROSS/BLUE SHIELD

## 2017-04-30 ENCOUNTER — Encounter (HOSPITAL_COMMUNITY)
Admission: RE | Admit: 2017-04-30 | Discharge: 2017-04-30 | Disposition: A | Discharge status: Home / Self Care | Payer: BLUE CROSS/BLUE SHIELD | Source: Ambulatory Visit | Attending: Cardiology | Admitting: Cardiology

## 2017-04-30 DIAGNOSIS — Z48812 Encounter for surgical aftercare following surgery on the circulatory system: Secondary | ICD-10-CM | POA: Diagnosis not present

## 2017-04-30 DIAGNOSIS — Z951 Presence of aortocoronary bypass graft: Secondary | ICD-10-CM

## 2017-04-30 DIAGNOSIS — I214 Non-ST elevation (NSTEMI) myocardial infarction: Secondary | ICD-10-CM

## 2017-04-30 NOTE — Progress Notes (Signed)
Daily Session Note  Patient Details  Name: Cindy Garrett MRN: [id number] Date of Birth: 10-24-1966 Referring Provider:     CARDIAC REHAB PHASE II ORIENTATION from 04/14/2017 in [place]  Referring Provider  Dr. Harl Bowie      Encounter Date: 04/30/2017  Check In:     Session Check In - 04/30/17 0930      Check-In   Location [facility]   Staff Present Diane Angelina Pih, MS, EP, [facility], Exercise Physiologist;Nasiya Pascual Wynetta Emery, RN, BSN;Gregory Cowan, BS, EP, Exercise Physiologist   Supervising physician immediately available to respond to emergencies See telemetry face sheet for immediately available MD   Medication changes reported     No   Fall or balance concerns reported    No   Tobacco Cessation No Change   Warm-up and Cool-down Performed as group-led instruction   Resistance Training Performed Yes   VAD Patient? No     Pain Assessment   Currently in Pain? No/denies   Pain Score 0-No pain   Multiple Pain Sites No      Capillary Blood Glucose: No results found for this or any previous visit (from the past 24 hour(s)).    History  Smoking Status  . Former Smoker  . Packs/day: 1.00  . Years: 30.00  . Types: Cigarettes  . Quit date: 12/10/2016  Smokeless Tobacco  . Never Used    Goals Met:  Independence with exercise equipment Exercise tolerated well No report of cardiac concerns or symptoms Strength training completed today  Goals Unmet:  Not Applicable  Comments: Check out 1030.   Dr. Kate Sable is Medical Director for [facility] Cardiac and Pulmonary Rehab.

## 2017-05-02 ENCOUNTER — Encounter (HOSPITAL_COMMUNITY)
Admission: RE | Admit: 2017-05-02 | Discharge: 2017-05-02 | Disposition: A | Discharge status: Home / Self Care | Payer: BLUE CROSS/BLUE SHIELD | Source: Ambulatory Visit | Attending: Cardiology | Admitting: Cardiology

## 2017-05-02 DIAGNOSIS — Z951 Presence of aortocoronary bypass graft: Secondary | ICD-10-CM

## 2017-05-02 DIAGNOSIS — Z48812 Encounter for surgical aftercare following surgery on the circulatory system: Secondary | ICD-10-CM | POA: Diagnosis not present

## 2017-05-02 DIAGNOSIS — I214 Non-ST elevation (NSTEMI) myocardial infarction: Secondary | ICD-10-CM

## 2017-05-02 NOTE — Progress Notes (Signed)
Daily Session Note  Patient Details  Name: Cindy Garrett MRN: [id number] Date of Birth: [birth year] Referring Provider:     CARDIAC REHAB PHASE II ORIENTATION from 04/14/2017 in [place]  Referring Provider  Dr. Harl Bowie      Encounter Date: 05/02/2017  Check In:     Session Check In - 05/02/17 0930      Check-In   Location [facility]   Staff Present Suzanne Boron, BS, EP, Exercise Physiologist;Braxson Hollingsworth Wynetta Emery, RN, BSN   Supervising physician immediately available to respond to emergencies See telemetry face sheet for immediately available MD   Medication changes reported     No   Fall or balance concerns reported    No   Tobacco Cessation No Change   Warm-up and Cool-down Performed as group-led instruction   Resistance Training Performed Yes   VAD Patient? No     Pain Assessment   Currently in Pain? No/denies   Pain Score 0-No pain   Multiple Pain Sites No      Capillary Blood Glucose: No results found for this or any previous visit (from the past 24 hour(s)).    History  Smoking Status  . Former Smoker  . Packs/day: 1.00  . Years: 30.00  . Types: Cigarettes  . Quit date: 12/10/2016  Smokeless Tobacco  . Never Used    Goals Met:  Independence with exercise equipment Exercise tolerated well No report of cardiac concerns or symptoms Strength training completed today  Goals Unmet:  Not Applicable  Comments: Check out 1030.   Dr. Kate Sable is Medical Director for [facility] Cardiac and Pulmonary Rehab.

## 2017-05-05 ENCOUNTER — Encounter (HOSPITAL_COMMUNITY)
Admission: RE | Admit: 2017-05-05 | Discharge: 2017-05-05 | Disposition: A | Discharge status: Home / Self Care | Payer: BLUE CROSS/BLUE SHIELD | Source: Ambulatory Visit | Attending: Cardiology | Admitting: Cardiology

## 2017-05-05 DIAGNOSIS — Z951 Presence of aortocoronary bypass graft: Secondary | ICD-10-CM | POA: Insufficient documentation

## 2017-05-05 DIAGNOSIS — Z48812 Encounter for surgical aftercare following surgery on the circulatory system: Secondary | ICD-10-CM | POA: Insufficient documentation

## 2017-05-05 DIAGNOSIS — I214 Non-ST elevation (NSTEMI) myocardial infarction: Secondary | ICD-10-CM | POA: Diagnosis present

## 2017-05-05 NOTE — Progress Notes (Signed)
Daily Session Note  Patient Details  Name: Cindy Garrett MRN: [id number] Date of Birth: 1966-05-10 Referring Provider:     CARDIAC REHAB PHASE II ORIENTATION from 04/14/2017 in [place]  Referring Provider  Dr. Harl Bowie      Encounter Date: 05/05/2017  Check In:     Session Check In - 05/05/17 0959      Check-In   Location [facility]   Staff Present Russella Dar, MS, EP, [facility], Exercise Physiologist;Michael Ventresca Luther Parody, BS, EP, Exercise Physiologist   Supervising physician immediately available to respond to emergencies See telemetry face sheet for immediately available MD   Medication changes reported     No   Fall or balance concerns reported    No   Warm-up and Cool-down Performed as group-led instruction   Resistance Training Performed Yes   VAD Patient? No     Pain Assessment   Currently in Pain? No/denies   Pain Score 0-No pain   Multiple Pain Sites No      Capillary Blood Glucose: No results found for this or any previous visit (from the past 24 hour(s)).    History  Smoking Status  . Former Smoker  . Packs/day: 1.00  . Years: 30.00  . Types: Cigarettes  . Quit date: 12/10/2016  Smokeless Tobacco  . Never Used    Goals Met:  Independence with exercise equipment Exercise tolerated well No report of cardiac concerns or symptoms Strength training completed today  Goals Unmet:  Not Applicable  Comments: Check out 1030   Dr. Kate Sable is Medical Director for [place] and Pulmonary Rehab.

## 2017-05-07 ENCOUNTER — Encounter (HOSPITAL_COMMUNITY): Payer: BLUE CROSS/BLUE SHIELD

## 2017-05-09 ENCOUNTER — Encounter (HOSPITAL_COMMUNITY)
Admission: RE | Admit: 2017-05-09 | Discharge: 2017-05-09 | Disposition: A | Discharge status: Home / Self Care | Payer: BLUE CROSS/BLUE SHIELD | Source: Ambulatory Visit | Attending: Cardiology | Admitting: Cardiology

## 2017-05-12 ENCOUNTER — Encounter (HOSPITAL_COMMUNITY): Payer: BLUE CROSS/BLUE SHIELD

## 2017-05-12 NOTE — Progress Notes (Signed)
Cardiac Individual Treatment Plan  Patient Details  Name: Cindy SabalFrances Gittings MRN: [id number] Date of Birth: 06/10/1966 Referring Provider:     CARDIAC REHAB PHASE [facility]  Referring Provider  Dr. Wyline MoodBranch      Initial Encounter Date:    C[facility] RE[facility]AB [facility] [facility]  Date  04/14/17  Referring Provider  Dr. Wyline MoodBranch      Visit Diagnosis: NSTEMI (non-ST elevated myocardial infarction) (HCC)  S/P CABG x 3  Patient's Home Medications on Admission:  Current Outpatient Prescriptions:  .  acetaminophen (TYLENOL) 325 MG tablet, Take 2 tablets (650 mg total) by mouth every 6 (six) hours as needed for mild pain., Disp: , Rfl:  .  aspirin EC 81 MG EC tablet, Take 1 tablet (81 mg total) by mouth daily., Disp: , Rfl:  .  atorvastatin (LIPITOR) 80 MG tablet, Take 1 tablet (80 mg total) by mouth daily at 6 PM., Disp: 30 tablet, Rfl: 3 .  clopidogrel (PLAVIX) 75 MG tablet, Take 1 tablet (75 mg total) by mouth daily., Disp: 30 tablet, Rfl: 3 .  lisinopril (PRINIVIL,ZESTRIL) 10 MG tablet, Take 1 tablet (10 mg total) by mouth daily., Disp: 30 tablet, Rfl: 6 .  metoprolol tartrate (LOPRESSOR) 25 MG tablet, Take 0.5 tablets (12.5 mg total) by mouth 2 (two) times daily., Disp: 60 tablet, Rfl: 3 .  oxyCODONE (OXY IR/ROXICODONE) 5 MG immediate release tablet, Take 1-2 tablets (5-10 mg total) by mouth every 3 (three) hours as needed for severe pain. (Patient not taking: Reported on 04/14/2017), Disp: 30 tablet, Rfl: 0  Past Medical History: Past Medical History:  Diagnosis Date  . Stroke [facility](HCC)     Tobacco Use: History  Smoking Status  . Former Smoker  . Packs/day: 1.00  . Years: 30.00  . Types: Cigarettes  . Quit date: 12/10/2016  Smokeless Tobacco  . Never Used    Labs: Recent Review Flowsheet Data    Labs for ITP Cardiac and Pulmonary Rehab Latest Ref Rng & Units 12/11/2016 12/11/2016  12/11/2016 12/11/2016 12/12/2016   Cholestrol 0 - 200 mg/dL - - - - 409123   LDLCALC 0 - 99 mg/dL - - - - 59   HDL >81>40 mg/dL - - - - 19(J25(L)   Trlycerides <150 mg/dL - - - - 478(G195(H)   PHART 7.350 - 7.450 7.275(L) 7.324(L) 7.306(L) - -   PCO2ART 32.0 - 48.0 mmHg 52.0(H) 48.5(H) 46.4 - -   HCO3 20.0 - 28.0 mmol/L 24.3 25.4 23.3 - -   TCO2 0 - 100 mmol/L 26 27 25 27  -   ACIDBASEDEF 0.0 - 2.0 mmol/L 3.0(H) 1.0 3.0(H) - -   O2SAT % 100.0 97.0 99.0 - -      Capillary Blood Glucose: Lab Results  Component Value Date   GLUCAP 118 (H) 12/12/2016   GLUCAP 123 (H) 12/12/2016   GLUCAP 122 (H) 12/11/2016   GLUCAP 118 (H) 12/11/2016   GLUCAP 131 (H) 12/11/2016     Exercise Target Goals:    Exercise Program Goal: Individual exercise prescription set with THRR, safety & activity barriers. Participant demonstrates ability to understand and report RPE using BORG scale, to self-measure pulse accurately, and to acknowledge the importance of the exercise prescription.  Exercise Prescription Goal: Starting with aerobic activity 30 plus minutes a day, 3 days per week for initial exercise prescription. Provide home exercise prescription and guidelines that participant acknowledges understanding prior to discharge.  Activity Barriers & Risk Stratification:   6 Minute Walk:     6 Minute Walk    Row Name 04/14/17 1439         6 Minute Walk   Phase Initial     Distance 1550 feet     Distance % Change 0 %     Walk Time 6 minutes     # of Rest Breaks 0     MPH 2.93     METS 3.25     RPE 9     Perceived Dyspnea  6     VO2 Peak 13.11     Symptoms No     Resting HR 57 bpm     Resting BP 126/74     Max Ex. HR 86 bpm     Max Ex. BP 140/76     2 Minute Post BP 128/74        Oxygen Initial Assessment:   Oxygen Re-Evaluation:   Oxygen Discharge (Final Oxygen Re-Evaluation):   Initial Exercise Prescription:     Initial Exercise Prescription - 04/14/17 1400      Date of Initial Exercise RX  and Referring Provider   Date 04/14/17   Referring Provider Dr. Wyline Mood     NuStep   Level 2   SPM 7   Minutes 15   METs 1.7     Arm Ergometer   Level 1.5   Watts 10   RPM 10   Minutes 20   METs 1.8     Prescription Details   Frequency (times per week) 3   Duration Progress to 30 minutes of continuous aerobic without signs/symptoms of physical distress     Intensity   THRR 40-80% of Max Heartrate [phone]   Ratings of Perceived Exertion 11-13   Perceived Dyspnea 0-4     Progression   Progression Continue progressive overload as per policy without signs/symptoms or physical distress.     Resistance Training   Training Prescription Yes   Weight 1   Reps 10-15      Perform Capillary Blood Glucose checks as needed.  Exercise Prescription Changes:      Exercise Prescription Changes    Row Name 05/06/17 0800             Response to Exercise   Blood Pressure (Admit) 136/64       Blood Pressure (Exercise) 146/72       Blood Pressure (Exit) 122/68       Heart Rate (Admit) 55 bpm       Heart Rate (Exercise) 79 bpm       Heart Rate (Exit) 58 bpm       Rating of Perceived Exertion (Exercise) 12       Duration Progress to 30 minutes of  aerobic without signs/symptoms of physical distress       Intensity THRR unchanged         Resistance Training   Training Prescription Yes       Weight 2       Reps 10-15         NuStep   Level 3       SPM 21       Minutes 15       METs 2         Arm Ergometer   Level 1.6       Watts 18       RPM 10       Minutes  20       METs 2.3         Home Exercise Plan   Plans to continue exercise at Home (comment)       Frequency Add 2 additional days to program exercise sessions.          Exercise Comments:      Exercise Comments    Row Name 04/17/17 1543 05/06/17 0801         Exercise Comments Patient has just started CR and will be progressed in time.  Patient is doing well in CR.          Exercise Goals  and Review:      Exercise Goals    Row Name 04/14/17 1425             Exercise Goals   Increase Physical Activity Yes       Intervention Provide advice, education, support and counseling about physical activity/exercise needs.;Develop an individualized exercise prescription for aerobic and resistive training based on initial evaluation findings, risk stratification, comorbidities and participant's personal goals.       Expected Outcomes Achievement of increased cardiorespiratory fitness and enhanced flexibility, muscular endurance and strength shown through measurements of functional capacity and personal statement of participant.       Increase Strength and Stamina Yes       Intervention Provide advice, education, support and counseling about physical activity/exercise needs.;Develop an individualized exercise prescription for aerobic and resistive training based on initial evaluation findings, risk stratification, comorbidities and participant's personal goals.       Expected Outcomes Achievement of increased cardiorespiratory fitness and enhanced flexibility, muscular endurance and strength shown through measurements of functional capacity and personal statement of participant.          Exercise Goals Re-Evaluation :     Exercise Goals Re-Evaluation    Row Name 05/12/17 1303             Exercise Goal Re-Evaluation   Exercise Goals Review Increase Physical Activity;Increase Strenth and Stamina       Comments Patient has completed 7 sessions with some progression. Will continue to monitor for progress.        Expected Outcomes Patient will complete the program with continued increased strength, stamina, and activity.            Discharge Exercise Prescription (Final Exercise Prescription Changes):     Exercise Prescription Changes - 05/06/17 0800      Response to Exercise   Blood Pressure (Admit) 136/64   Blood Pressure (Exercise) 146/72   Blood Pressure (Exit) 122/68    Heart Rate (Admit) 55 bpm   Heart Rate (Exercise) 79 bpm   Heart Rate (Exit) 58 bpm   Rating of Perceived Exertion (Exercise) 12   Duration Progress to 30 minutes of  aerobic without signs/symptoms of physical distress   Intensity THRR unchanged     Resistance Training   Training Prescription Yes   Weight 2   Reps 10-15     NuStep   Level 3   SPM 21   Minutes 15   METs 2     Arm Ergometer   Level 1.6   Watts 18   RPM 10   Minutes 20   METs 2.3     Home Exercise Plan   Plans to continue exercise at Home (comment)   Frequency Add 2 additional days to program exercise sessions.      Nutrition:  Target Goals: Understanding of nutrition  guidelines, daily intake of sodium 1500mg , cholesterol 200mg , calories 30% from fat and 7% or less from saturated fats, daily to have 5 or more servings of fruits and vegetables.  Biometrics:     Pre Biometrics - 04/14/17 1437      Pre Biometrics   Height 5\' 2"  (1.575 m)   Weight 194 lb 0.1 oz (88 kg)   Waist Circumference 41 inches   Hip Circumference 42.5 inches   Waist to Hip Ratio 0.96 %   BMI (Calculated) 35.6   Triceps Skinfold 17 mm   % Body Fat 41.9 %   Grip Strength 42.67 kg   Flexibility 14.67 in   Single Leg Stand 4 seconds       Nutrition Therapy Plan and Nutrition Goals:   Nutrition Discharge: Rate Your Plate Scores:     Nutrition Assessments - 04/14/17 1420      MEDFICTS Scores   Pre Score 98      Nutrition Goals Re-Evaluation:   Nutrition Goals Discharge (Final Nutrition Goals Re-Evaluation):   Psychosocial: Target Goals: Acknowledge presence or absence of significant depression and/or stress, maximize coping skills, provide positive support system. Participant is able to verbalize types and ability to use techniques and skills needed for reducing stress and depression.  Initial Review & Psychosocial Screening:     Initial Psych Review & Screening - 04/14/17 1450      Initial Review    Current issues with Current Depression     Family Dynamics   Good Support System? Yes   Comments Patient has current depression. QOL was 27.41 and her PHQ-9 was 16. She is not currently being treated. She is interested in counseling. She says she would rather talk to her PCP herself about treatment options.      Barriers   Psychosocial barriers to participate in program Psychosocial barriers identified (see note)     Screening Interventions   Interventions Encouraged to exercise;Provide feedback about the scores to participant;To provide support and resources with identified psychosocial needs      Quality of Life Scores:     Quality of Life - 04/14/17 1438      Quality of Life Scores   Health/Function Pre 25.2 %   Socioeconomic Pre 27.71 %   Psych/Spiritual Pre 30 %   Family Pre 30 %   GLOBAL Pre 27.41 %      PHQ-9: Recent Review Flowsheet Data    Depression screen [facility] 2/9 04/14/2017   Decreased Interest 1   Down, Depressed, Hopeless 1   PHQ - 2 Score 2   Altered sleeping 3   Tired, decreased energy 3   Change in appetite 3   Feeling bad or failure about yourself  1   Trouble concentrating 3   Moving slowly or fidgety/restless 1   Suicidal thoughts 0   PHQ-9 Score 16   Difficult doing work/chores Very difficult     Interpretation of Total Score  Total Score Depression Severity:  1-4 = Minimal depression, 5-9 = Mild depression, 10-14 = Moderate depression, 15-19 = Moderately severe depression, 20-27 = Severe depression   Psychosocial Evaluation and Intervention:     Psychosocial Evaluation - 04/14/17 1453      Psychosocial Evaluation & Interventions   Interventions Relaxation education;Encouraged to exercise with the program and follow exercise prescription;Stress management education   Comments Patient has current depression. QOL was 27.41 and her PHQ-9 was 16. She is not currently being treated. She is interested in counseling. She says  she would rather talk  to her PCP herself about treatment options.   Expected Outcomes Patient will talk to her PCP about treatment for her depression. Staff will f/u.    Continue Psychosocial Services  Follow up required by staff      Psychosocial Re-Evaluation:     Psychosocial Re-Evaluation    Row Name 05/12/17 1305             Psychosocial Re-Evaluation   Current issues with Current Depression       Comments Patient has not spoken with her PCP yet about counseling. Will continue to monitor.        Expected Outcomes Patient's QOL and PHQ-9 scores will improve at discharge.        Interventions Stress management education;Encouraged to attend Cardiac Rehabilitation for the exercise;Relaxation education       Continue Psychosocial Services  Follow up required by staff          Psychosocial Discharge (Final Psychosocial Re-Evaluation):     Psychosocial Re-Evaluation - 05/12/17 1305      Psychosocial Re-Evaluation   Current issues with Current Depression   Comments Patient has not spoken with her PCP yet about counseling. Will continue to monitor.    Expected Outcomes Patient's QOL and PHQ-9 scores will improve at discharge.    Interventions Stress management education;Encouraged to attend Cardiac Rehabilitation for the exercise;Relaxation education   Continue Psychosocial Services  Follow up required by staff      Vocational Rehabilitation: Provide vocational rehab assistance to qualifying candidates.   Vocational Rehab Evaluation & Intervention:     Vocational Rehab - 04/14/17 1420      Initial Vocational Rehab Evaluation & Intervention   Assessment shows need for Vocational Rehabilitation No      Education: Education Goals: Education classes will be provided on a weekly basis, covering required topics. Participant will state understanding/return demonstration of topics presented.  Learning Barriers/Preferences:     Learning Barriers/Preferences - 04/14/17 1419      Learning  Barriers/Preferences   Learning Barriers None   Learning Preferences Written Material;Pictoral      Education Topics: Hypertension, Hypertension Reduction -Define heart disease and high blood pressure. Discus how high blood pressure affects the body and ways to reduce high blood pressure.   Exercise and Your Heart -Discuss why it is important to exercise, the FITT principles of exercise, normal and abnormal responses to exercise, and how to exercise safely.   Angina -Discuss definition of angina, causes of angina, treatment of angina, and how to decrease risk of having angina.   Cardiac Medications -Review what the following cardiac medications are used for, how they affect the body, and side effects that may occur when taking the medications.  Medications include Aspirin, Beta blockers, calcium channel blockers, ACE Inhibitors, angiotensin receptor blockers, diuretics, digoxin, and antihyperlipidemics.   Congestive Heart Failure -Discuss the definition of CHF, how to live with CHF, the signs and symptoms of CHF, and how keep track of weight and sodium intake.   Heart Disease and Intimacy -Discus the effect sexual activity has on the heart, how changes occur during intimacy as we age, and safety during sexual activity.   Smoking Cessation / COPD -Discuss different methods to quit smoking, the health benefits of quitting smoking, and the definition of COPD.   Nutrition I: Fats -Discuss the types of cholesterol, what cholesterol does to the heart, and how cholesterol levels can be controlled.   CARDIAC REHAB PHASE II EXERCISE from 04/30/2017  in [place] [facility]  Date  04/23/17  Educator  DC  Instruction Review Code  2- meets goals/outcomes      Nutrition II: Labels -Discuss the different components of food labels and how to read food label   CARDIAC REHAB PHASE II EXERCISE from 04/30/2017 in [place] [facility]  Date  04/30/17  Educator  DC   Instruction Review Code  2- meets goals/outcomes      Heart Parts and Heart Disease -Discuss the anatomy of the heart, the pathway of blood circulation through the heart, and these are affected by heart disease.   Stress I: Signs and Symptoms -Discuss the causes of stress, how stress may lead to anxiety and depression, and ways to limit stress.   Stress II: Relaxation -Discuss different types of relaxation techniques to limit stress.   Warning Signs of Stroke / TIA -Discuss definition of a stroke, what the signs and symptoms are of a stroke, and how to identify when someone is having stroke.   Knowledge Questionnaire Score:     Knowledge Questionnaire Score - 04/14/17 1448      Knowledge Questionnaire Score   Pre Score 21/24      Core Components/Risk Factors/Patient Goals at Admission:     Personal Goals and Risk Factors at Admission - 04/14/17 1422      Core Components/Risk Factors/Patient Goals on Admission    Weight Management Yes;Obesity   Intervention Weight Management: Develop a combined nutrition and exercise program designed to reach desired caloric intake, while maintaining appropriate intake of nutrient and fiber, sodium and fats, and appropriate energy expenditure required for the weight goal.;Weight Management: Provide education and appropriate resources to help participant work on and attain dietary goals.;Weight Management/Obesity: Establish reasonable short term and long term weight goals.   Admit Weight 194 lb (88 kg)   Goal Weight: Short Term 189 lb (85.7 kg)   Goal Weight: Long Term 184 lb (83.5 kg)   Stress Yes   Intervention Offer individual and/or small group education and counseling on adjustment to heart disease, stress management and health-related lifestyle change. Teach and support self-help strategies.;Refer participants experiencing significant psychosocial distress to appropriate mental health specialists for further evaluation and treatment.  When possible, include family members and significant others in education/counseling sessions.   Expected Outcomes Short Term: Participant demonstrates changes in health-related behavior, relaxation and other stress management skills, ability to obtain effective social support, and compliance with psychotropic medications if prescribed.;Long Term: Emotional wellbeing is indicated by absence of clinically significant psychosocial distress or social isolation.   Personal Goal Other Yes   Personal Goal Wants to feel better; get stronger; have more energy. Eat healthier.    Intervention Patient will attend CR 3 days/week and supplement with exercise at home 2 days/week.    Expected Outcomes Patient will meet her personal goals.       Core Components/Risk Factors/Patient Goals Review:      Goals and Risk Factor Review    Row Name 05/12/17 1301             Core Components/Risk Factors/Patient Goals Review   Personal Goals Review Weight Management/Obesity  Feel better; more energy; get stronger; lose weight.        Review Patient has completed 7 sessions losing 4 lbs. She has missed the past week d/t an acute illness. She has progressed some in her weight's and levels. Will continue to monitor for progress.        Expected Outcomes  Patient will complete the program meeting her personal goals.           Core Components/Risk Factors/Patient Goals at Discharge (Final Review):      Goals and Risk Factor Review - 05/12/17 1301      Core Components/Risk Factors/Patient Goals Review   Personal Goals Review Weight Management/Obesity  Feel better; more energy; get stronger; lose weight.    Review Patient has completed 7 sessions losing 4 lbs. She has missed the past week d/t an acute illness. She has progressed some in her weight's and levels. Will continue to monitor for progress.    Expected Outcomes Patient will complete the program meeting her personal goals.       ITP Comments:      ITP Comments    Row Name 04/21/17 1008           ITP Comments Patient new to program. Started today 04/21/17. Will continue to monitor for progress.           Comments: ITP 30 Day REVIEW Patient doing well in the program. Will continue to monitor for progress.

## 2017-05-14 ENCOUNTER — Encounter (HOSPITAL_COMMUNITY): Payer: BLUE CROSS/BLUE SHIELD

## 2017-05-16 ENCOUNTER — Encounter (HOSPITAL_COMMUNITY): Payer: BLUE CROSS/BLUE SHIELD

## 2017-05-19 ENCOUNTER — Encounter (HOSPITAL_COMMUNITY): Payer: BLUE CROSS/BLUE SHIELD

## 2017-05-21 ENCOUNTER — Encounter (HOSPITAL_COMMUNITY): Payer: BLUE CROSS/BLUE SHIELD

## 2017-05-23 ENCOUNTER — Encounter (HOSPITAL_COMMUNITY): Payer: BLUE CROSS/BLUE SHIELD

## 2017-05-26 ENCOUNTER — Encounter (HOSPITAL_COMMUNITY): Payer: BLUE CROSS/BLUE SHIELD

## 2017-05-28 ENCOUNTER — Encounter (HOSPITAL_COMMUNITY)
Admission: RE | Admit: 2017-05-28 | Discharge: 2017-05-28 | Disposition: A | Discharge status: Home / Self Care | Payer: BLUE CROSS/BLUE SHIELD | Source: Ambulatory Visit | Attending: Cardiology | Admitting: Cardiology

## 2017-05-29 ENCOUNTER — Encounter (HOSPITAL_COMMUNITY): Payer: BLUE CROSS/BLUE SHIELD

## 2017-05-30 ENCOUNTER — Encounter (HOSPITAL_COMMUNITY): Payer: BLUE CROSS/BLUE SHIELD

## 2017-06-02 ENCOUNTER — Encounter (HOSPITAL_COMMUNITY): Payer: BLUE CROSS/BLUE SHIELD

## 2017-06-03 NOTE — Progress Notes (Signed)
Cardiac Individual Treatment Plan  Patient Details  Name: Cindy Garrett MRN: [id number] Date of Birth: 06/10/1966 Referring Provider:     CARDIAC REHAB PHASE [facility]  Referring Provider  Dr. Wyline MoodBranch      Initial Encounter Date:    [facility]  Date  04/14/17  Referring Provider  Dr. Wyline MoodBranch      Visit Diagnosis: NSTEMI (non-ST elevated myocardial infarction) (HCC)  S/P CABG x 3  Patient's Home Medications on Admission:  Current Outpatient Prescriptions:  .  acetaminophen (TYLENOL) 325 MG tablet, Take 2 tablets (650 mg total) by mouth every 6 (six) hours as needed for mild pain., Disp: , Rfl:  .  aspirin EC 81 MG EC tablet, Take 1 tablet (81 mg total) by mouth daily., Disp: , Rfl:  .  atorvastatin (LIPITOR) 80 MG tablet, Take 1 tablet (80 mg total) by mouth daily at 6 PM., Disp: 30 tablet, Rfl: 3 .  clopidogrel (PLAVIX) 75 MG tablet, Take 1 tablet (75 mg total) by mouth daily., Disp: 30 tablet, Rfl: 3 .  lisinopril (PRINIVIL,ZESTRIL) 10 MG tablet, Take 1 tablet (10 mg total) by mouth daily., Disp: 30 tablet, Rfl: 6 .  metoprolol tartrate (LOPRESSOR) 25 MG tablet, Take 0.5 tablets (12.5 mg total) by mouth 2 (two) times daily., Disp: 60 tablet, Rfl: 3 .  oxyCODONE (OXY IR/ROXICODONE) 5 MG immediate release tablet, Take 1-2 tablets (5-10 mg total) by mouth every 3 (three) hours as needed for severe pain. (Patient not taking: Reported on 04/14/2017), Disp: 30 tablet, Rfl: 0  Past Medical History: Past Medical History:  Diagnosis Date  . Stroke [facility](HCC)     Tobacco Use: History  Smoking Status  . Former Smoker  . Packs/day: 1.00  . Years: 30.00  . Types: Cigarettes  . Quit date: 12/10/2016  Smokeless Tobacco  . Never Used    Labs: Recent Review Flowsheet Data    Labs for ITP Cardiac and Pulmonary Rehab Latest Ref Rng & Units 12/11/2016 12/11/2016  12/11/2016 12/11/2016 12/12/2016   Cholestrol 0 - 200 mg/dL - - - - 409123   LDLCALC 0 - 99 mg/dL - - - - 59   HDL >81>40 mg/dL - - - - 19(J25(L)   Trlycerides <150 mg/dL - - - - 478(G195(H)   PHART 7.350 - 7.450 7.275(L) 7.324(L) 7.306(L) - -   PCO2ART 32.0 - 48.0 mmHg 52.0(H) 48.5(H) 46.4 - -   HCO3 20.0 - 28.0 mmol/L 24.3 25.4 23.3 - -   TCO2 0 - 100 mmol/L 26 27 25 27  -   ACIDBASEDEF 0.0 - 2.0 mmol/L 3.0(H) 1.0 3.0(H) - -   O2SAT % 100.0 97.0 99.0 - -      Capillary Blood Glucose: Lab Results  Component Value Date   GLUCAP 118 (H) 12/12/2016   GLUCAP 123 (H) 12/12/2016   GLUCAP 122 (H) 12/11/2016   GLUCAP 118 (H) 12/11/2016   GLUCAP 131 (H) 12/11/2016     Exercise Target Goals:    Exercise Program Goal: Individual exercise prescription set with THRR, safety & activity barriers. Participant demonstrates ability to understand and report RPE using BORG scale, to self-measure pulse accurately, and to acknowledge the importance of the exercise prescription.  Exercise Prescription Goal: Starting with aerobic activity 30 plus minutes a day, 3 days per week for initial exercise prescription. Provide home exercise prescription and guidelines that participant acknowledges understanding prior to discharge.  Activity Barriers & Risk Stratification:   6 Minute Walk:     6 Minute Walk    Row Name 04/14/17 1439         6 Minute Walk   Phase Initial     Distance 1550 feet     Distance % Change 0 %     Walk Time 6 minutes     # of Rest Breaks 0     MPH 2.93     METS 3.25     RPE 9     Perceived Dyspnea  6     VO2 Peak 13.11     Symptoms No     Resting HR 57 bpm     Resting BP 126/74     Max Ex. HR 86 bpm     Max Ex. BP 140/76     2 Minute Post BP 128/74        Oxygen Initial Assessment:   Oxygen Re-Evaluation:   Oxygen Discharge (Final Oxygen Re-Evaluation):   Initial Exercise Prescription:     Initial Exercise Prescription - 04/14/17 1400      Date of Initial Exercise RX  and Referring Provider   Date 04/14/17   Referring Provider Dr. Wyline Mood     NuStep   Level 2   SPM 7   Minutes 15   METs 1.7     Arm Ergometer   Level 1.5   Watts 10   RPM 10   Minutes 20   METs 1.8     Prescription Details   Frequency (times per week) 3   Duration Progress to 30 minutes of continuous aerobic without signs/symptoms of physical distress     Intensity   THRR 40-80% of Max Heartrate [phone]   Ratings of Perceived Exertion 11-13   Perceived Dyspnea 0-4     Progression   Progression Continue progressive overload as per policy without signs/symptoms or physical distress.     Resistance Training   Training Prescription Yes   Weight 1   Reps 10-15      Perform Capillary Blood Glucose checks as needed.  Exercise Prescription Changes:      Exercise Prescription Changes    Row Name 05/06/17 0800             Response to Exercise   Blood Pressure (Admit) 136/64       Blood Pressure (Exercise) 146/72       Blood Pressure (Exit) 122/68       Heart Rate (Admit) 55 bpm       Heart Rate (Exercise) 79 bpm       Heart Rate (Exit) 58 bpm       Rating of Perceived Exertion (Exercise) 12       Duration Progress to 30 minutes of  aerobic without signs/symptoms of physical distress       Intensity THRR unchanged         Resistance Training   Training Prescription Yes       Weight 2       Reps 10-15         NuStep   Level 3       SPM 21       Minutes 15       METs 2         Arm Ergometer   Level 1.6       Watts 18       RPM 10       Minutes  20       METs 2.3         Home Exercise Plan   Plans to continue exercise at Home (comment)       Frequency Add 2 additional days to program exercise sessions.          Exercise Comments:      Exercise Comments    Row Name 04/17/17 1543 05/06/17 0801 05/26/17 1424       Exercise Comments Patient has just started CR and will be progressed in time.  Patient is doing well in CR.  Patient has not  atteneded another session since last progression          Exercise Goals and Review:      Exercise Goals    Row Name 04/14/17 1425             Exercise Goals   Increase Physical Activity Yes       Intervention Provide advice, education, support and counseling about physical activity/exercise needs.;Develop an individualized exercise prescription for aerobic and resistive training based on initial evaluation findings, risk stratification, comorbidities and participant's personal goals.       Expected Outcomes Achievement of increased cardiorespiratory fitness and enhanced flexibility, muscular endurance and strength shown through measurements of functional capacity and personal statement of participant.       Increase Strength and Stamina Yes       Intervention Provide advice, education, support and counseling about physical activity/exercise needs.;Develop an individualized exercise prescription for aerobic and resistive training based on initial evaluation findings, risk stratification, comorbidities and participant's personal goals.       Expected Outcomes Achievement of increased cardiorespiratory fitness and enhanced flexibility, muscular endurance and strength shown through measurements of functional capacity and personal statement of participant.          Exercise Goals Re-Evaluation :     Exercise Goals Re-Evaluation    Row Name 05/12/17 1303             Exercise Goal Re-Evaluation   Exercise Goals Review Increase Physical Activity;Increase Strenth and Stamina       Comments Patient has completed 7 sessions with some progression. Will continue to monitor for progress.        Expected Outcomes Patient will complete the program with continued increased strength, stamina, and activity.            Discharge Exercise Prescription (Final Exercise Prescription Changes):     Exercise Prescription Changes - 05/06/17 0800      Response to Exercise   Blood Pressure  (Admit) 136/64   Blood Pressure (Exercise) 146/72   Blood Pressure (Exit) 122/68   Heart Rate (Admit) 55 bpm   Heart Rate (Exercise) 79 bpm   Heart Rate (Exit) 58 bpm   Rating of Perceived Exertion (Exercise) 12   Duration Progress to 30 minutes of  aerobic without signs/symptoms of physical distress   Intensity THRR unchanged     Resistance Training   Training Prescription Yes   Weight 2   Reps 10-15     NuStep   Level 3   SPM 21   Minutes 15   METs 2     Arm Ergometer   Level 1.6   Watts 18   RPM 10   Minutes 20   METs 2.3     Home Exercise Plan   Plans to continue exercise at Home (comment)   Frequency Add 2 additional days to program exercise sessions.  Nutrition:  Target Goals: Understanding of nutrition guidelines, daily intake of sodium 1500mg , cholesterol 200mg , calories 30% from fat and 7% or less from saturated fats, daily to have 5 or more servings of fruits and vegetables.  Biometrics:     Pre Biometrics - 04/14/17 1437      Pre Biometrics   Height 5\' 2"  (1.575 m)   Weight 194 lb 0.1 oz (88 kg)   Waist Circumference 41 inches   Hip Circumference 42.5 inches   Waist to Hip Ratio 0.96 %   BMI (Calculated) 35.6   Triceps Skinfold 17 mm   % Body Fat 41.9 %   Grip Strength 42.67 kg   Flexibility 14.67 in   Single Leg Stand 4 seconds       Nutrition Therapy Plan and Nutrition Goals:   Nutrition Discharge: Rate Your Plate Scores:     Nutrition Assessments - 04/14/17 1420      MEDFICTS Scores   Pre Score 98      Nutrition Goals Re-Evaluation:   Nutrition Goals Discharge (Final Nutrition Goals Re-Evaluation):   Psychosocial: Target Goals: Acknowledge presence or absence of significant depression and/or stress, maximize coping skills, provide positive support system. Participant is able to verbalize types and ability to use techniques and skills needed for reducing stress and depression.  Initial Review & Psychosocial  Screening:     Initial Psych Review & Screening - 04/14/17 1450      Initial Review   Current issues with Current Depression     Family Dynamics   Good Support System? Yes   Comments Patient has current depression. QOL was 27.41 and her PHQ-9 was 16. She is not currently being treated. She is interested in counseling. She says she would rather talk to her PCP herself about treatment options.      Barriers   Psychosocial barriers to participate in program Psychosocial barriers identified (see note)     Screening Interventions   Interventions Encouraged to exercise;Provide feedback about the scores to participant;To provide support and resources with identified psychosocial needs      Quality of Life Scores:     Quality of Life - 04/14/17 1438      Quality of Life Scores   Health/Function Pre 25.2 %   Socioeconomic Pre 27.71 %   Psych/Spiritual Pre 30 %   Family Pre 30 %   GLOBAL Pre 27.41 %      PHQ-9: Recent Review Flowsheet Data    [facility]   Decreased Interest 1   Down, Depressed, Hopeless 1   PHQ - 2 Score 2   Altered sleeping 3   Tired, decreased energy 3   Change in appetite 3   Feeling bad or failure about yourself  1   Trouble concentrating 3   Moving slowly or fidgety/restless 1   Suicidal thoughts 0   PHQ-9 Score 16   Difficult doing work/chores Very difficult     Interpretation of Total Score  Total Score Depression Severity:  1-4 = Minimal depression, 5-9 = Mild depression, 10-14 = Moderate depression, 15-19 = Moderately severe depression, 20-27 = Severe depression   Psychosocial Evaluation and Intervention:     Psychosocial Evaluation - 04/14/17 1453      Psychosocial Evaluation & Interventions   Interventions Relaxation education;Encouraged to exercise with the program and follow exercise prescription;Stress management education   Comments Patient has current depression. QOL was 27.41 and her PHQ-9 was 16. She is  not currently being  treated. She is interested in counseling. She says she would rather talk to her PCP herself about treatment options.   Expected Outcomes Patient will talk to her PCP about treatment for her depression. Staff will f/u.    Continue Psychosocial Services  Follow up required by staff      Psychosocial Re-Evaluation:     Psychosocial Re-Evaluation    Row Name 05/12/17 1305             Psychosocial Re-Evaluation   Current issues with Current Depression       Comments Patient has not spoken with her PCP yet about counseling. Will continue to monitor.        Expected Outcomes Patient's QOL and PHQ-9 scores will improve at discharge.        Interventions Stress management education;Encouraged to attend Cardiac Rehabilitation for the exercise;Relaxation education       Continue Psychosocial Services  Follow up required by staff          Psychosocial Discharge (Final Psychosocial Re-Evaluation):     Psychosocial Re-Evaluation - 05/12/17 1305      Psychosocial Re-Evaluation   Current issues with Current Depression   Comments Patient has not spoken with her PCP yet about counseling. Will continue to monitor.    Expected Outcomes Patient's QOL and PHQ-9 scores will improve at discharge.    Interventions Stress management education;Encouraged to attend Cardiac Rehabilitation for the exercise;Relaxation education   Continue Psychosocial Services  Follow up required by staff      Vocational Rehabilitation: Provide vocational rehab assistance to qualifying candidates.   Vocational Rehab Evaluation & Intervention:     Vocational Rehab - 04/14/17 1420      Initial Vocational Rehab Evaluation & Intervention   Assessment shows need for Vocational Rehabilitation No      Education: Education Goals: Education classes will be provided on a weekly basis, covering required topics. Participant will state understanding/return demonstration of topics presented.  Learning  Barriers/Preferences:     Learning Barriers/Preferences - 04/14/17 1419      Learning Barriers/Preferences   Learning Barriers None   Learning Preferences Written Material;Pictoral      Education Topics: Hypertension, Hypertension Reduction -Define heart disease and high blood pressure. Discus how high blood pressure affects the body and ways to reduce high blood pressure.   Exercise and Your Heart -Discuss why it is important to exercise, the FITT principles of exercise, normal and abnormal responses to exercise, and how to exercise safely.   Angina -Discuss definition of angina, causes of angina, treatment of angina, and how to decrease risk of having angina.   Cardiac Medications -Review what the following cardiac medications are used for, how they affect the body, and side effects that may occur when taking the medications.  Medications include Aspirin, Beta blockers, calcium channel blockers, ACE Inhibitors, angiotensin receptor blockers, diuretics, digoxin, and antihyperlipidemics.   Congestive Heart Failure -Discuss the definition of CHF, how to live with CHF, the signs and symptoms of CHF, and how keep track of weight and sodium intake.   Heart Disease and Intimacy -Discus the effect sexual activity has on the heart, how changes occur during intimacy as we age, and safety during sexual activity.   Smoking Cessation / COPD -Discuss different methods to quit smoking, the health benefits of quitting smoking, and the definition of COPD.   Nutrition I: Fats -Discuss the types of cholesterol, what cholesterol does to the heart, and how cholesterol levels can be controlled.  CARDIAC REHAB PHASE II EXERCISE from 04/30/2017 in [place] [facility]  Date  04/23/17  Educator  DC  Instruction Review Code  2- meets goals/outcomes      Nutrition II: Labels -Discuss the different components of food labels and how to read food label   CARDIAC REHAB PHASE II  EXERCISE from 04/30/2017 in [place] [facility]  Date  04/30/17  Educator  DC  Instruction Review Code  2- meets goals/outcomes      Heart Parts and Heart Disease -Discuss the anatomy of the heart, the pathway of blood circulation through the heart, and these are affected by heart disease.   Stress I: Signs and Symptoms -Discuss the causes of stress, how stress may lead to anxiety and depression, and ways to limit stress.   Stress II: Relaxation -Discuss different types of relaxation techniques to limit stress.   Warning Signs of Stroke / TIA -Discuss definition of a stroke, what the signs and symptoms are of a stroke, and how to identify when someone is having stroke.   Knowledge Questionnaire Score:     Knowledge Questionnaire Score - 04/14/17 1448      Knowledge Questionnaire Score   Pre Score 21/24      Core Components/Risk Factors/Patient Goals at Admission:     Personal Goals and Risk Factors at Admission - 04/14/17 1422      Core Components/Risk Factors/Patient Goals on Admission    Weight Management Yes;Obesity   Intervention Weight Management: Develop a combined nutrition and exercise program designed to reach desired caloric intake, while maintaining appropriate intake of nutrient and fiber, sodium and fats, and appropriate energy expenditure required for the weight goal.;Weight Management: Provide education and appropriate resources to help participant work on and attain dietary goals.;Weight Management/Obesity: Establish reasonable short term and long term weight goals.   Admit Weight 194 lb (88 kg)   Goal Weight: Short Term 189 lb (85.7 kg)   Goal Weight: Long Term 184 lb (83.5 kg)   Stress Yes   Intervention Offer individual and/or small group education and counseling on adjustment to heart disease, stress management and health-related lifestyle change. Teach and support self-help strategies.;Refer participants experiencing significant  psychosocial distress to appropriate mental health specialists for further evaluation and treatment. When possible, include family members and significant others in education/counseling sessions.   Expected Outcomes Short Term: Participant demonstrates changes in health-related behavior, relaxation and other stress management skills, ability to obtain effective social support, and compliance with psychotropic medications if prescribed.;Long Term: Emotional wellbeing is indicated by absence of clinically significant psychosocial distress or social isolation.   Personal Goal Other Yes   Personal Goal Wants to feel better; get stronger; have more energy. Eat healthier.    Intervention Patient will attend CR 3 days/week and supplement with exercise at home 2 days/week.    Expected Outcomes Patient will meet her personal goals.       Core Components/Risk Factors/Patient Goals Review:      Goals and Risk Factor Review    Row Name 05/12/17 1301             Core Components/Risk Factors/Patient Goals Review   Personal Goals Review Weight Management/Obesity  Feel better; more energy; get stronger; lose weight.        Review Patient has completed 7 sessions losing 4 lbs. She has missed the past week d/t an acute illness. She has progressed some in her weight's and levels. Will continue to monitor for progress.  Expected Outcomes Patient will complete the program meeting her personal goals.           Core Components/Risk Factors/Patient Goals at Discharge (Final Review):      Goals and Risk Factor Review - 05/12/17 1301      Core Components/Risk Factors/Patient Goals Review   Personal Goals Review Weight Management/Obesity  Feel better; more energy; get stronger; lose weight.    Review Patient has completed 7 sessions losing 4 lbs. She has missed the past week d/t an acute illness. She has progressed some in her weight's and levels. Will continue to monitor for progress.    Expected  Outcomes Patient will complete the program meeting her personal goals.       ITP Comments:     ITP Comments    Row Name 04/21/17 1008 06/03/17 0802         ITP Comments Patient new to program. Started today 04/21/17. Will continue to monitor for progress.  Patient stopped coming after 7 sessions. She did not return our calls. Letter of discharge was mailed. MD will be notified.          Comments: Patient stopped coming to Cardiac Rehab on 05/05/2017 after completing 7 sessions. Called patient to remind them of the Cardiac Rehab policy that if they do not call or come for 3 consecutive visits that they would be discharged from the CR program. Doctor will be informed.

## 2017-06-03 NOTE — Progress Notes (Signed)
Discharge Summary  Patient Details  Name: Cindy Garrett MRN: [id number] Date of Birth: 06/20/1966 Referring Provider:     CARDIAC REHAB PHASE II [facility]  Referring Provider  Dr. Wyline MoodBranch       Number of Visits: 7  Reason for Discharge:  Early Exit:  Patient did not return our phone calls.   Smoking History:  History  Smoking Status  . Former Smoker  . Packs/day: 1.00  . Years: 30.00  . Types: Cigarettes  . Quit date: 12/10/2016  Smokeless Tobacco  . Never Used    Diagnosis:  NSTEMI (non-ST elevated myocardial infarction) (HCC)  S/P CABG x 3  ADL UCSD:   Initial Exercise Prescription:     Initial Exercise Prescription - 04/14/17 1400      Date of Initial Exercise RX and Referring Provider   Date 04/14/17   Referring Provider Dr. Wyline MoodBranch     NuStep   Level 2   SPM 7   Minutes 15   METs 1.7     Arm Ergometer   Level 1.5   Watts 10   RPM 10   Minutes 20   METs 1.8     Prescription Details   Frequency (times per week) 3   Duration Progress to 30 minutes of continuous aerobic without signs/symptoms of physical distress     Intensity   THRR 40-80% of Max Heartrate [phone]   Ratings of Perceived Exertion 11-13   Perceived Dyspnea 0-4     Progression   Progression Continue progressive overload as per policy without signs/symptoms or physical distress.     Resistance Training   Training Prescription Yes   Weight 1   Reps 10-15      Discharge Exercise Prescription (Final Exercise Prescription Changes):     Exercise Prescription Changes - 05/06/17 0800      Response to Exercise   Blood Pressure (Admit) 136/64   Blood Pressure (Exercise) 146/72   Blood Pressure (Exit) 122/68   Heart Rate (Admit) 55 bpm   Heart Rate (Exercise) 79 bpm   Heart Rate (Exit) 58 bpm   Rating of Perceived Exertion (Exercise) 12   Duration Progress to 30 minutes of  aerobic without signs/symptoms of physical  distress   Intensity THRR unchanged     Resistance Training   Training Prescription Yes   Weight 2   Reps 10-15     NuStep   Level 3   SPM 21   Minutes 15   METs 2     Arm Ergometer   Level 1.6   Watts 18   RPM 10   Minutes 20   METs 2.3     Home Exercise Plan   Plans to continue exercise at Home (comment)   Frequency Add 2 additional days to program exercise sessions.      Functional Capacity:     6 Minute Walk    Row Name 04/14/17 1439         6 Minute Walk   Phase Initial     Distance 1550 feet     Distance % Change 0 %     Walk Time 6 minutes     # of Rest Breaks 0     MPH 2.93     METS 3.25     RPE 9     Perceived Dyspnea  6     VO2 Peak 13.11     Symptoms No     Resting  HR 57 bpm     Resting BP 126/74     Max Ex. HR 86 bpm     Max Ex. BP 140/76     2 Minute Post BP 128/74        Psychological, QOL, Others - Outcomes: PHQ 2/9: Depression screen PHQ 2/9 04/14/2017  Decreased Interest 1  Down, Depressed, Hopeless 1  PHQ - 2 Score 2  Altered sleeping 3  Tired, decreased energy 3  Change in appetite 3  Feeling bad or failure about yourself  1  Trouble concentrating 3  Moving slowly or fidgety/restless 1  Suicidal thoughts 0  PHQ-9 Score 16  Difficult doing work/chores Very difficult    Quality of Life:     Quality of Life - 04/14/17 1438      Quality of Life Scores   Health/Function Pre 25.2 %   Socioeconomic Pre 27.71 %   Psych/Spiritual Pre 30 %   Family Pre 30 %   GLOBAL Pre 27.41 %      Personal Goals: Goals established at orientation with interventions provided to work toward goal.     Personal Goals and Risk Factors at Admission - 04/14/17 1422      Core Components/Risk Factors/Patient Goals on Admission    Weight Management Yes;Obesity   Intervention Weight Management: Develop a combined nutrition and exercise program designed to reach desired caloric intake, while maintaining appropriate intake of nutrient and  fiber, sodium and fats, and appropriate energy expenditure required for the weight goal.;Weight Management: Provide education and appropriate resources to help participant work on and attain dietary goals.;Weight Management/Obesity: Establish reasonable short term and long term weight goals.   Admit Weight 194 lb (88 kg)   Goal Weight: Short Term 189 lb (85.7 kg)   Goal Weight: Long Term 184 lb (83.5 kg)   Stress Yes   Intervention Offer individual and/or small group education and counseling on adjustment to heart disease, stress management and health-related lifestyle change. Teach and support self-help strategies.;Refer participants experiencing significant psychosocial distress to appropriate mental health specialists for further evaluation and treatment. When possible, include family members and significant others in education/counseling sessions.   Expected Outcomes Short Term: Participant demonstrates changes in health-related behavior, relaxation and other stress management skills, ability to obtain effective social support, and compliance with psychotropic medications if prescribed.;Long Term: Emotional wellbeing is indicated by absence of clinically significant psychosocial distress or social isolation.   Personal Goal Other Yes   Personal Goal Wants to feel better; get stronger; have more energy. Eat healthier.    Intervention Patient will attend CR 3 days/week and supplement with exercise at home 2 days/week.    Expected Outcomes Patient will meet her personal goals.        Personal Goals Discharge:     Goals and Risk Factor Review    Row Name 05/12/17 1301             Core Components/Risk Factors/Patient Goals Review   Personal Goals Review Weight Management/Obesity  Feel better; more energy; get stronger; lose weight.        Review Patient has completed 7 sessions losing 4 lbs. She has missed the past week d/t an acute illness. She has progressed some in her weight's and  levels. Will continue to monitor for progress.        Expected Outcomes Patient will complete the program meeting her personal goals.           Nutrition & Weight - Outcomes:  Pre Biometrics - 04/14/17 1437      Pre Biometrics   Height 5\' 2"  (1.575 m)   Weight 194 lb 0.1 oz (88 kg)   Waist Circumference 41 inches   Hip Circumference 42.5 inches   Waist to Hip Ratio 0.96 %   BMI (Calculated) 35.6   Triceps Skinfold 17 mm   % Body Fat 41.9 %   Grip Strength 42.67 kg   Flexibility 14.67 in   Single Leg Stand 4 seconds       Nutrition:   Nutrition Discharge:     Nutrition Assessments - 04/14/17 1420      MEDFICTS Scores   Pre Score 98      Education Questionnaire Score:     Knowledge Questionnaire Score - 04/14/17 1448      Knowledge Questionnaire Score   Pre Score 21/24

## 2017-06-03 NOTE — Addendum Note (Signed)
Encounter addended by: Suann LarryJohnson, Laquia Rosano L, RN on: 06/03/2017  8:07 AM<BR>    Actions taken: Visit Navigator Flowsheet section accepted, Sign clinical note, Episode resolved

## 2017-06-04 ENCOUNTER — Encounter (HOSPITAL_COMMUNITY): Payer: BLUE CROSS/BLUE SHIELD

## 2017-06-06 ENCOUNTER — Encounter (HOSPITAL_COMMUNITY): Payer: BLUE CROSS/BLUE SHIELD

## 2017-06-09 ENCOUNTER — Encounter (HOSPITAL_COMMUNITY): Payer: BLUE CROSS/BLUE SHIELD

## 2017-06-11 ENCOUNTER — Encounter (HOSPITAL_COMMUNITY): Payer: BLUE CROSS/BLUE SHIELD

## 2017-06-13 ENCOUNTER — Encounter (HOSPITAL_COMMUNITY): Payer: BLUE CROSS/BLUE SHIELD

## 2017-06-16 ENCOUNTER — Encounter (HOSPITAL_COMMUNITY): Payer: BLUE CROSS/BLUE SHIELD

## 2017-06-18 ENCOUNTER — Encounter (HOSPITAL_COMMUNITY): Payer: Self-pay

## 2017-06-20 ENCOUNTER — Encounter (HOSPITAL_COMMUNITY): Payer: Self-pay

## 2017-06-23 ENCOUNTER — Encounter (HOSPITAL_COMMUNITY): Payer: BLUE CROSS/BLUE SHIELD

## 2017-06-25 ENCOUNTER — Encounter (HOSPITAL_COMMUNITY): Payer: BLUE CROSS/BLUE SHIELD

## 2017-06-27 ENCOUNTER — Encounter (HOSPITAL_COMMUNITY): Payer: BLUE CROSS/BLUE SHIELD

## 2017-06-30 ENCOUNTER — Encounter (HOSPITAL_COMMUNITY): Payer: BLUE CROSS/BLUE SHIELD

## 2017-07-02 ENCOUNTER — Encounter (HOSPITAL_COMMUNITY): Payer: BLUE CROSS/BLUE SHIELD

## 2017-07-04 ENCOUNTER — Encounter (HOSPITAL_COMMUNITY): Payer: BLUE CROSS/BLUE SHIELD

## 2017-07-07 ENCOUNTER — Encounter (HOSPITAL_COMMUNITY): Payer: BLUE CROSS/BLUE SHIELD

## 2017-07-09 ENCOUNTER — Encounter (HOSPITAL_COMMUNITY): Payer: BLUE CROSS/BLUE SHIELD

## 2017-07-11 ENCOUNTER — Encounter (HOSPITAL_COMMUNITY): Payer: BLUE CROSS/BLUE SHIELD

## 2017-10-12 IMAGING — CT CT HEAD W/O CM
4 series · 17 of 47 positions shown, 19 images · non-contrast
Comparison: None.

CLINICAL DATA: Right-sided numbness and weakness for 1 day

EXAM:
CT HEAD WITHOUT CONTRAST
TECHNIQUE: Contiguous axial images were obtained from the base of the skull
through the vertex without intravenous contrast.

[Series 2: head w/o · axial · non-contrast · 0.44mm/px · z∈[+50,+182]mm · 8 of 43 slices shown, 10 images]
[im 5/43  brain]
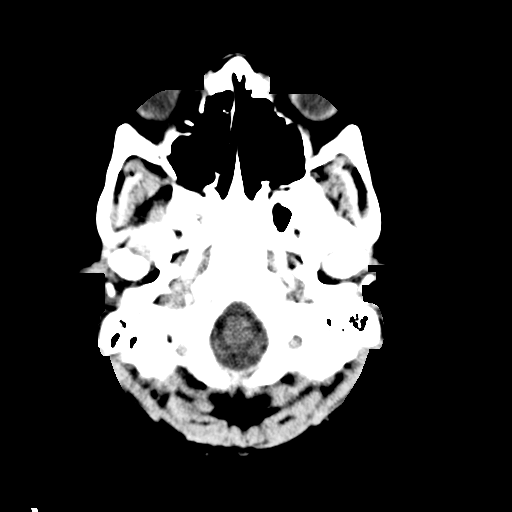
[im 5/43  bone]
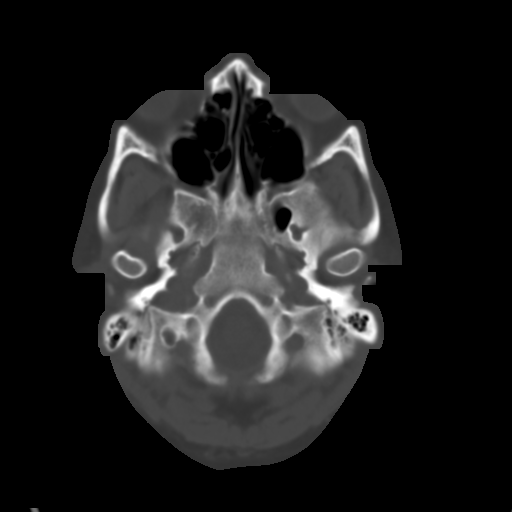
[im 10/43  brain]
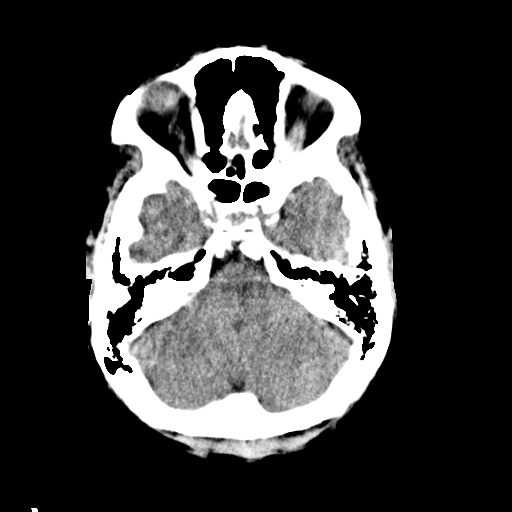
[im 15/43  brain]
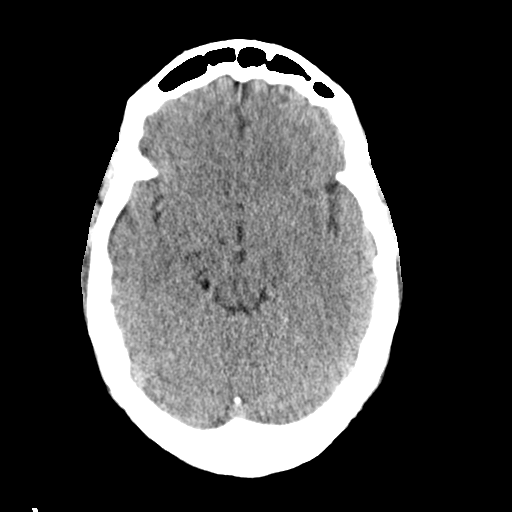
[im 19/43  brain]
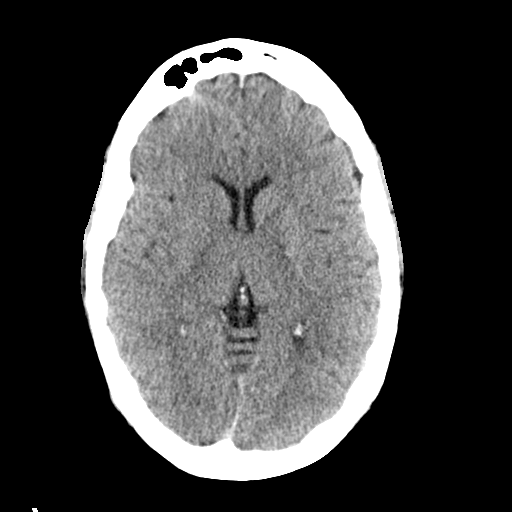
[im 24/43  brain]
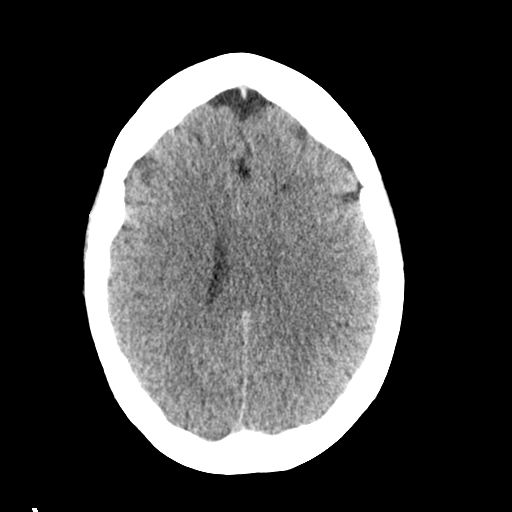
[im 24/43  bone]
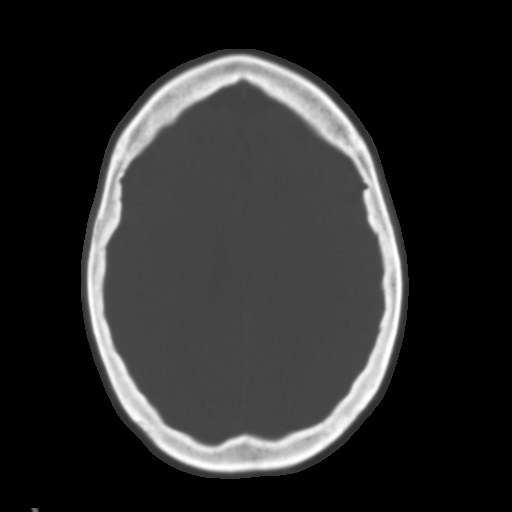
[im 29/43  brain]
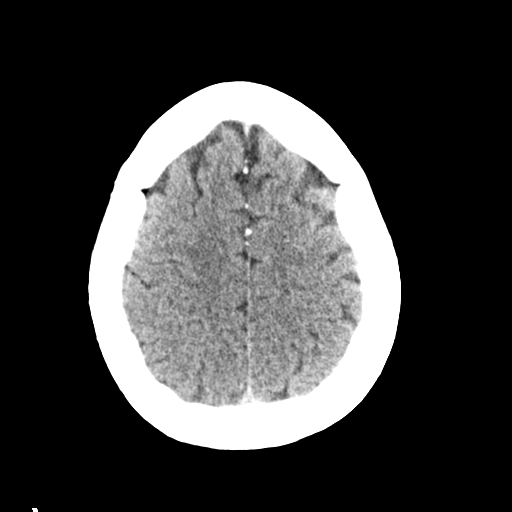
[im 33/43  brain]
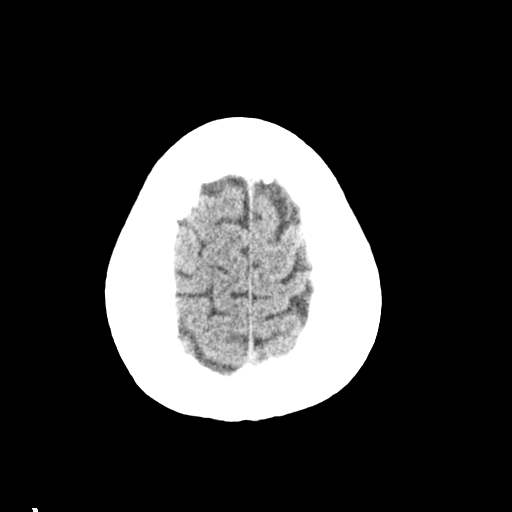
[im 38/43  brain]
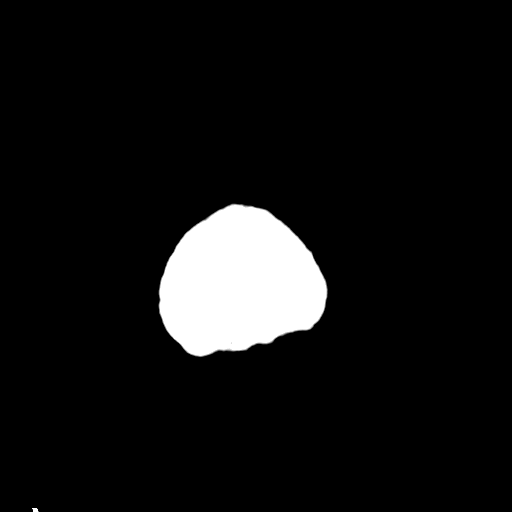

[Series 3: head bone · axial · 0.44mm/px · z∈[+50,+86]mm · 3 of 86 slices shown]
[im 9/86  bone]
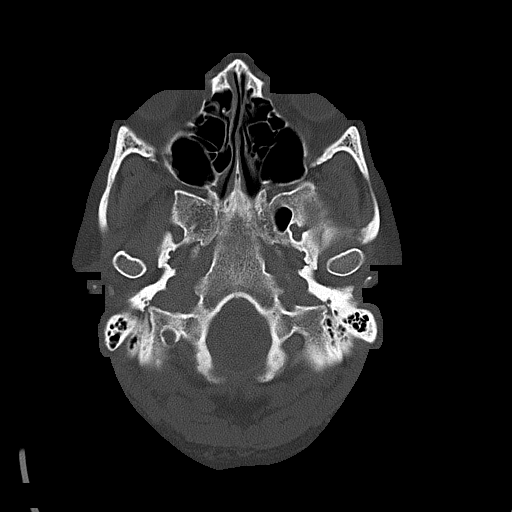
[im 18/86  bone]
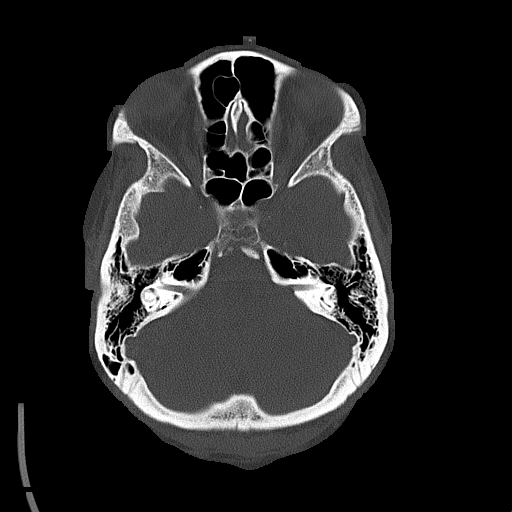
[im 27/86  bone]
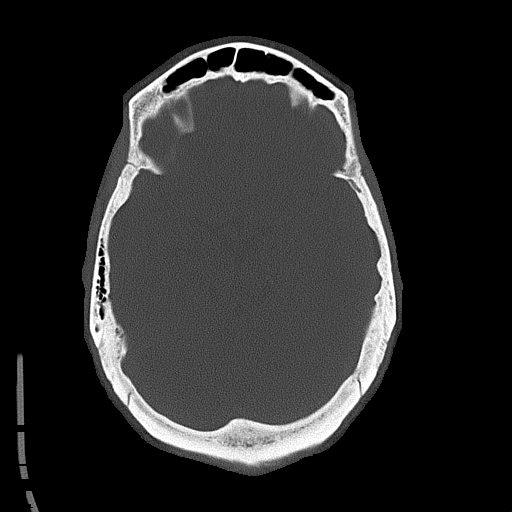

[Series 4: coronal · coronal · 0.33mm/px · 3 of 71 slices shown]
[im 24/71  brain]
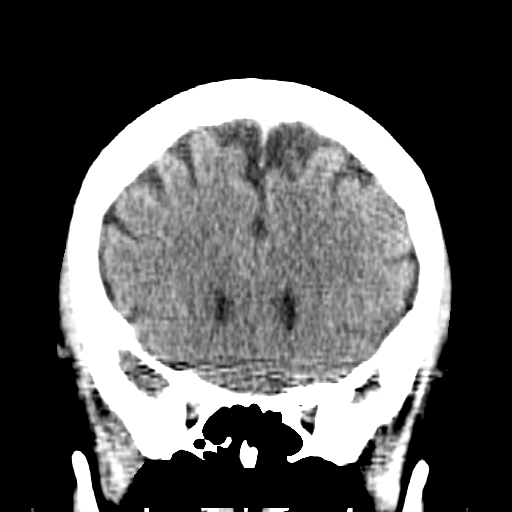
[im 32/71  brain]
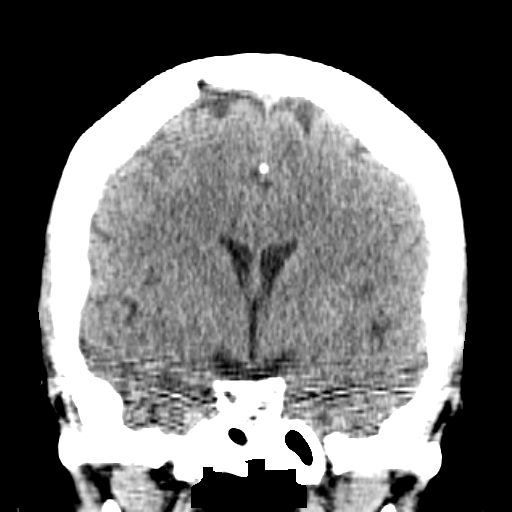
[im 39/71  brain]
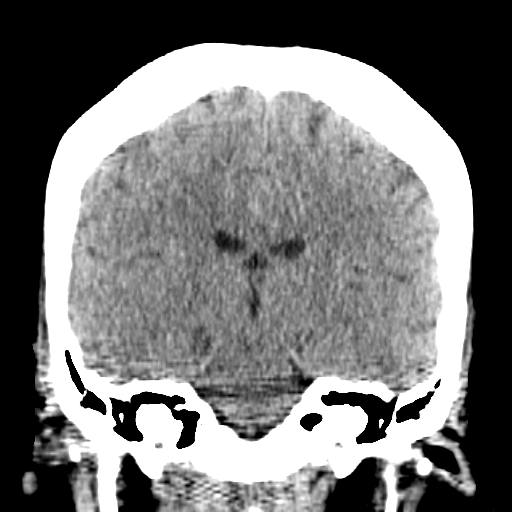

[Series 5: sagittal · sagittal · 0.37mm/px · 3 of 65 slices shown]
[im 22/65  brain]
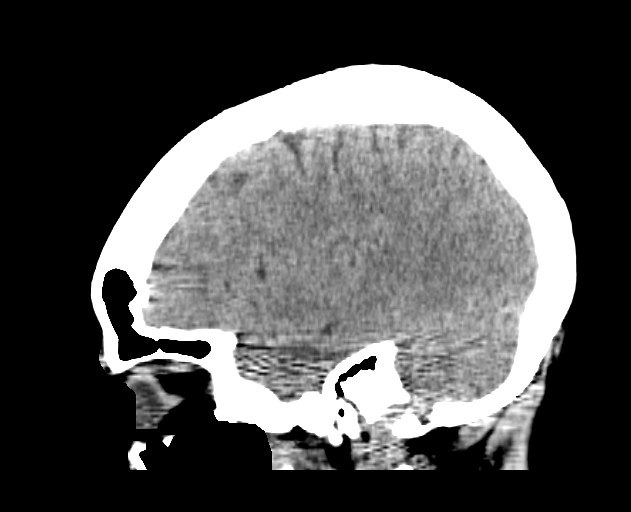
[im 33/65  brain]
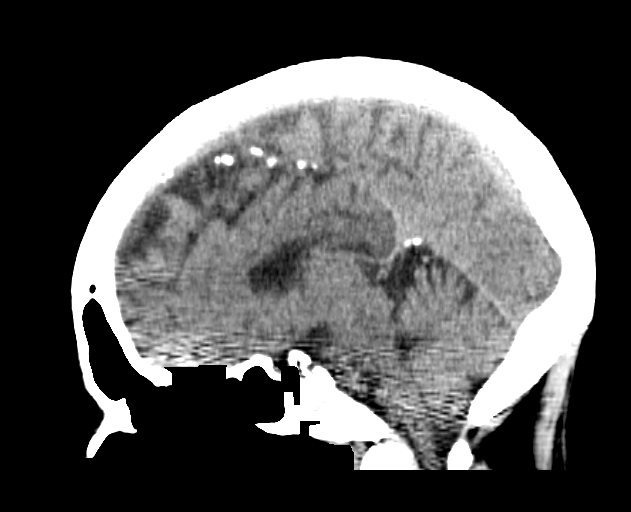
[im 43/65  brain]
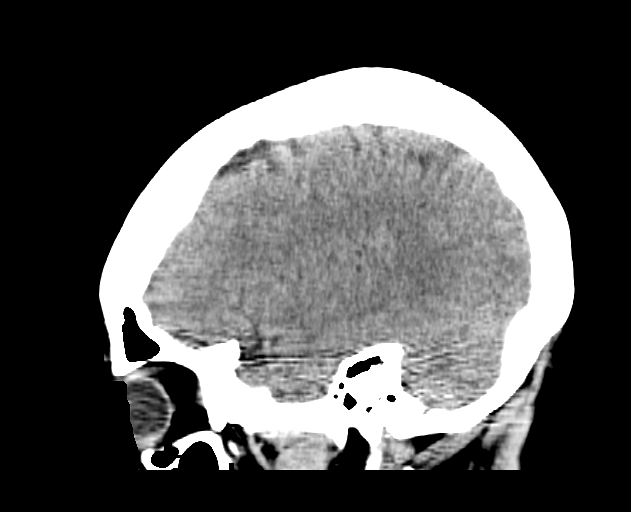

[17 of 47 positions shown; findings below may reference images not displayed]

FINDINGS: Brain: No evidence of acute infarction, hemorrhage, hydrocephalus,
extra-axial collection or mass lesion/mass effect.

Vascular: No hyperdense vessel or unexpected calcification.

Skull: No osseous abnormality.

Sinuses/Orbits: Visualized paranasal sinuses are clear. Visualized
mastoid sinuses are clear. Visualized orbits demonstrate no focal
abnormality.

Other: None
IMPRESSION: No acute intracranial pathology.

## 2018-03-10 IMAGING — CR DG CHEST 1V PORT
1 series · 1 of 1 positions shown · non-contrast
Comparison: 12/10/2016

CLINICAL DATA: Postoperative coronary artery bypass.

EXAM:
PORTABLE CHEST 1 VIEW

[AP]
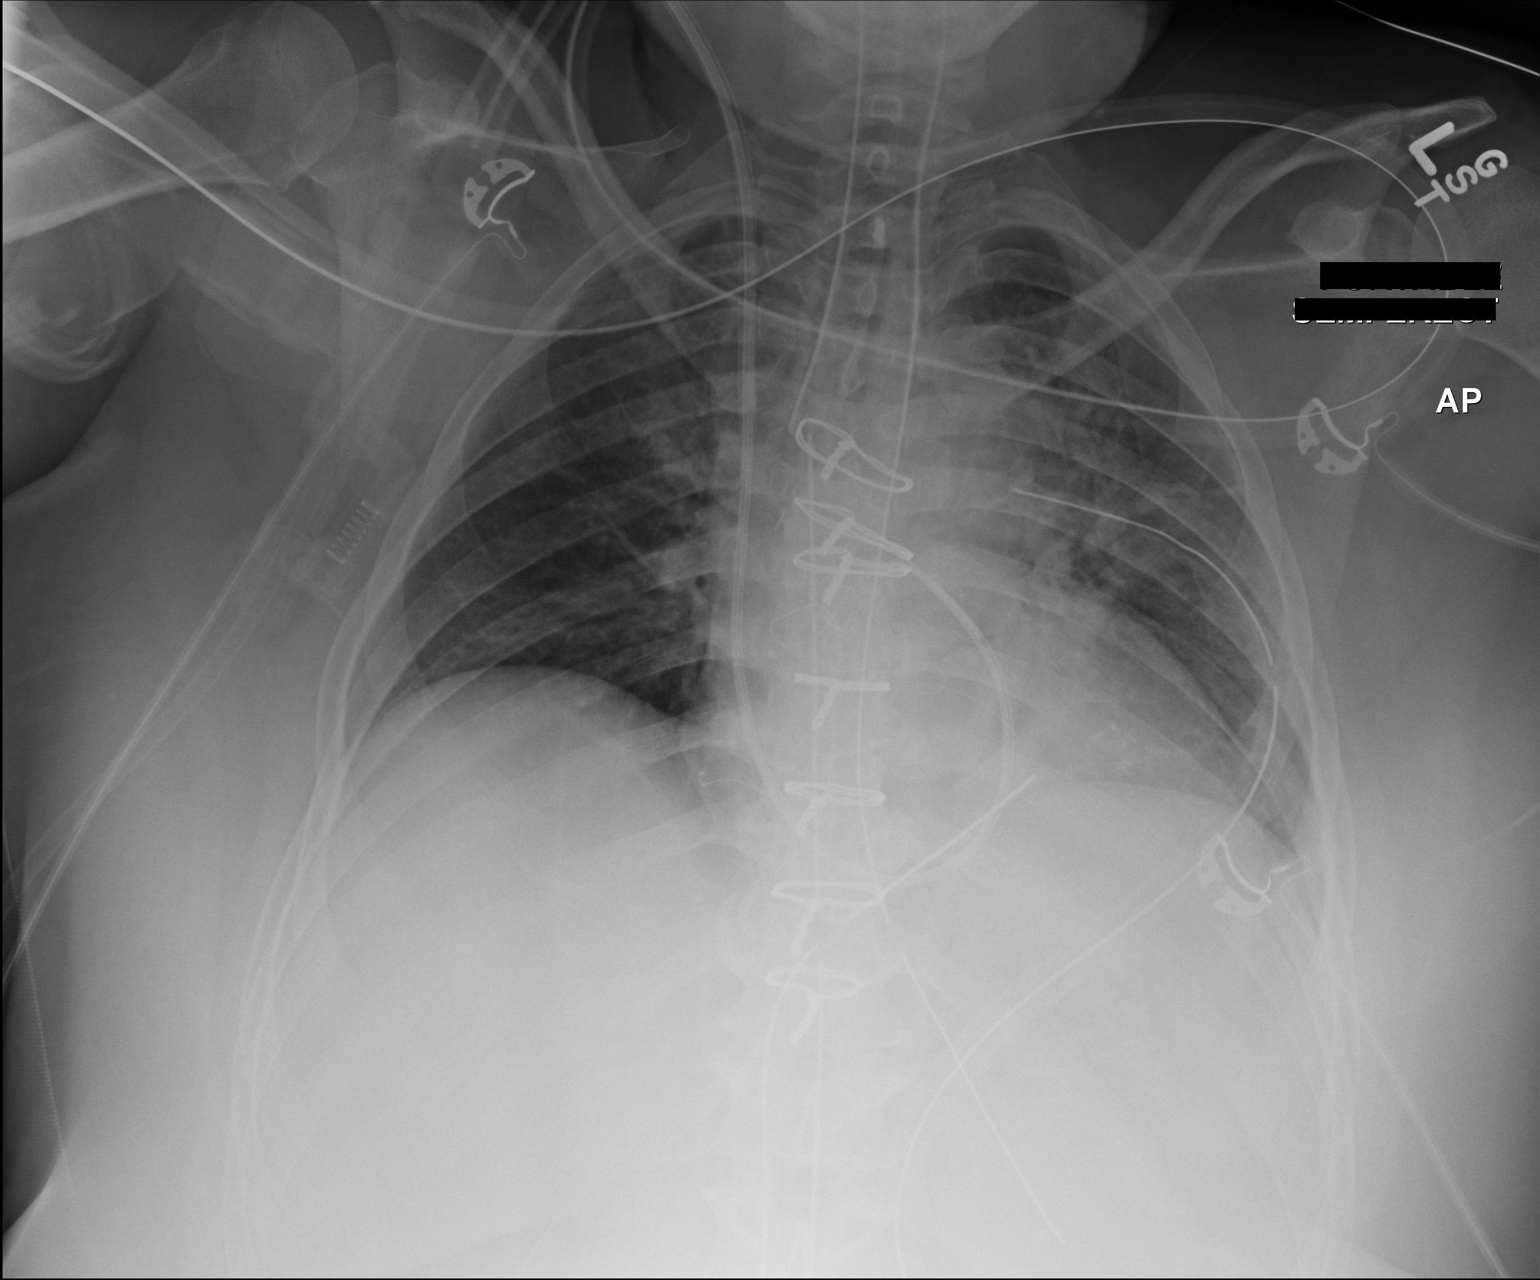

[1 of 1 positions shown; findings below may reference images not displayed]

FINDINGS: Interval postoperative changes with median sternotomy wires and
surgical clips in the mediastinum. An endotracheal tube is been
placed. The tip measures about 11 mm above the carina. Right
Swan-Ganz catheter with tip in the pulmonary outflow tract region.
Mediastinal drains. Left chest tube. Shallow inspiration. Heart size
and pulmonary vascularity are normal. Focal infiltration or
atelectasis in the left upper lung. No pneumothorax. Degenerative
changes in the spine.
IMPRESSION: Appliances appear in satisfactory location. Atelectasis or focal
consolidation in the left upper lung.

## 2018-03-11 IMAGING — CR DG CHEST 1V PORT
1 series · 1 of 1 positions shown · non-contrast
Comparison: December 10, 2016

CLINICAL DATA: Recent coronary artery bypass grafting. Atelectatic
change.

EXAM:
PORTABLE CHEST 1 VIEW

[AP]
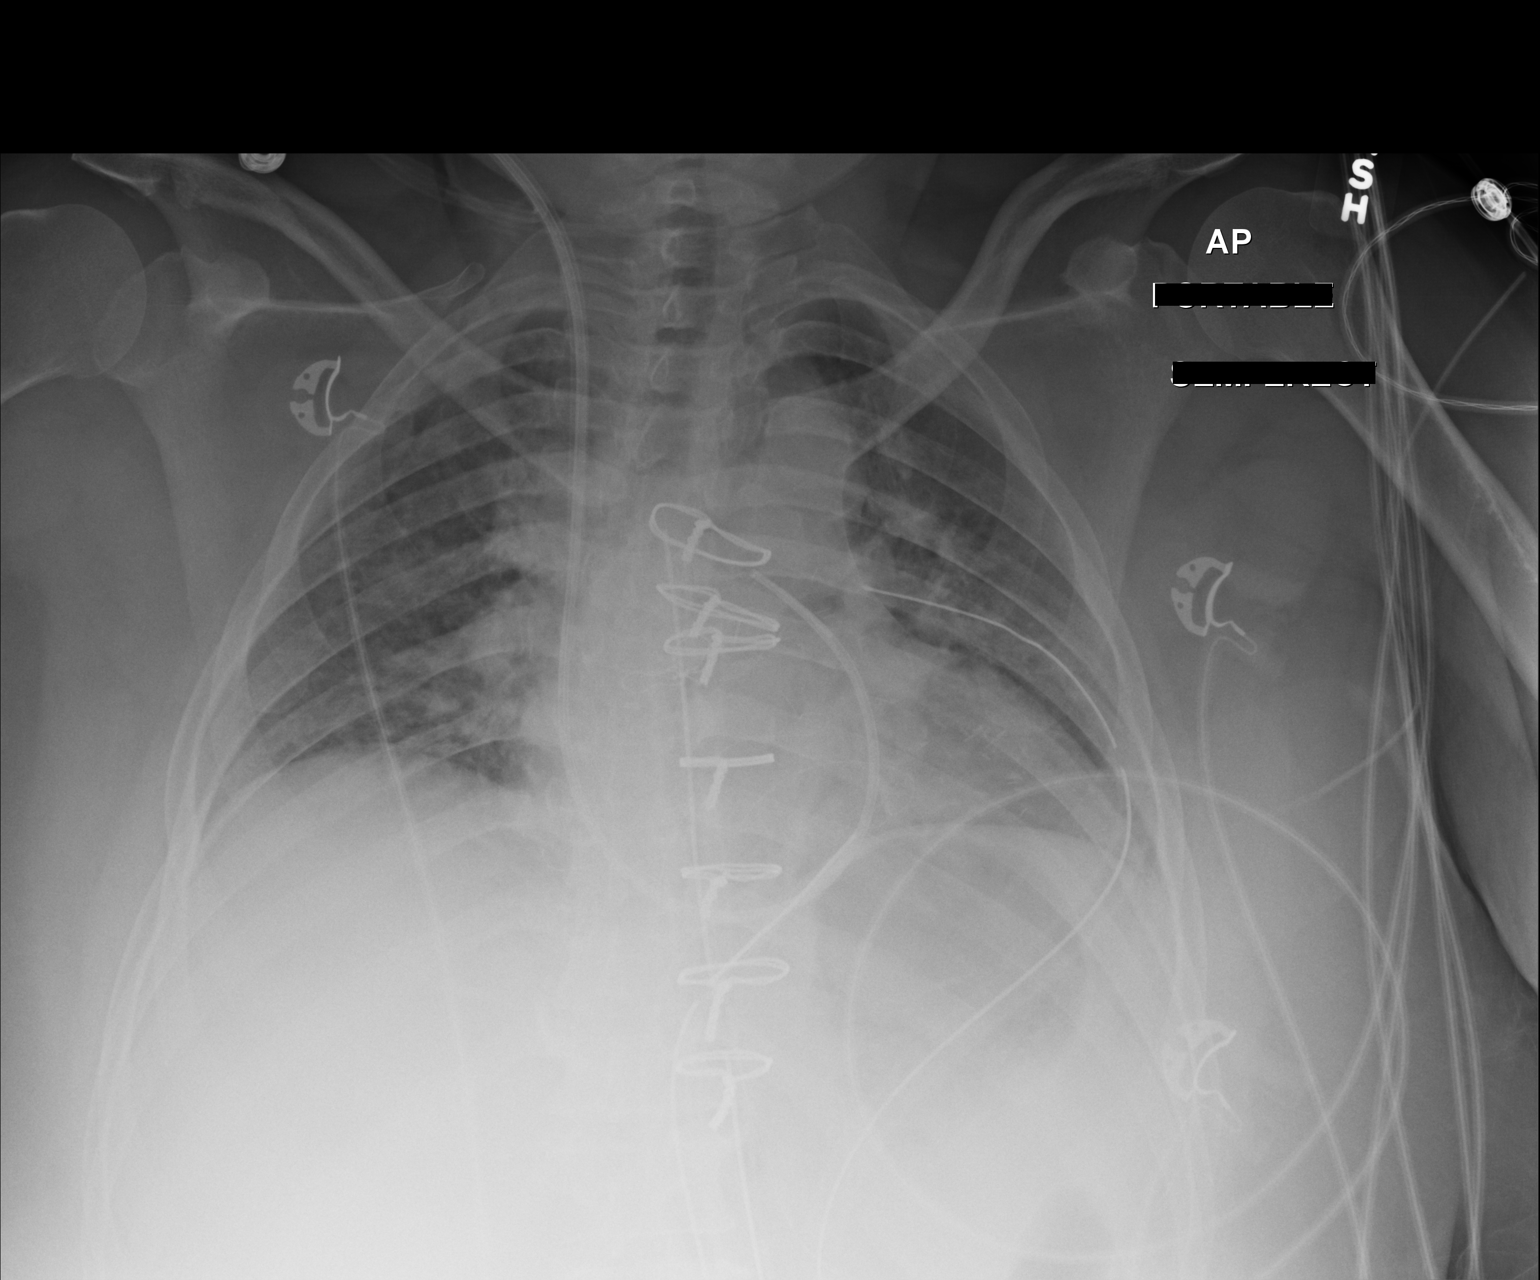

[1 of 1 positions shown; findings below may reference images not displayed]

FINDINGS: Endotracheal tube and nasogastric tube have been removed. Swan-Ganz
catheter tip is in proximal most aspect of the right main pulmonary
artery. There is a mediastinal drain and left chest tube present,
unchanged. No pneumothorax. There is slight left base atelectasis.
Lungs elsewhere are clear. Heart is upper normal in size with
pulmonary vascularity within normal limits. No adenopathy.
IMPRESSION: Tube and catheter positions as described without pneumothorax. Mild
left base atelectasis. Lungs elsewhere clear. Stable cardiac
silhouette.

## 2018-03-12 IMAGING — CR DG CHEST 1V PORT
1 series · 1 of 1 positions shown · non-contrast
Comparison: 12/11/2016

CLINICAL DATA: Post CABG

EXAM:
PORTABLE CHEST 1 VIEW

[AP]
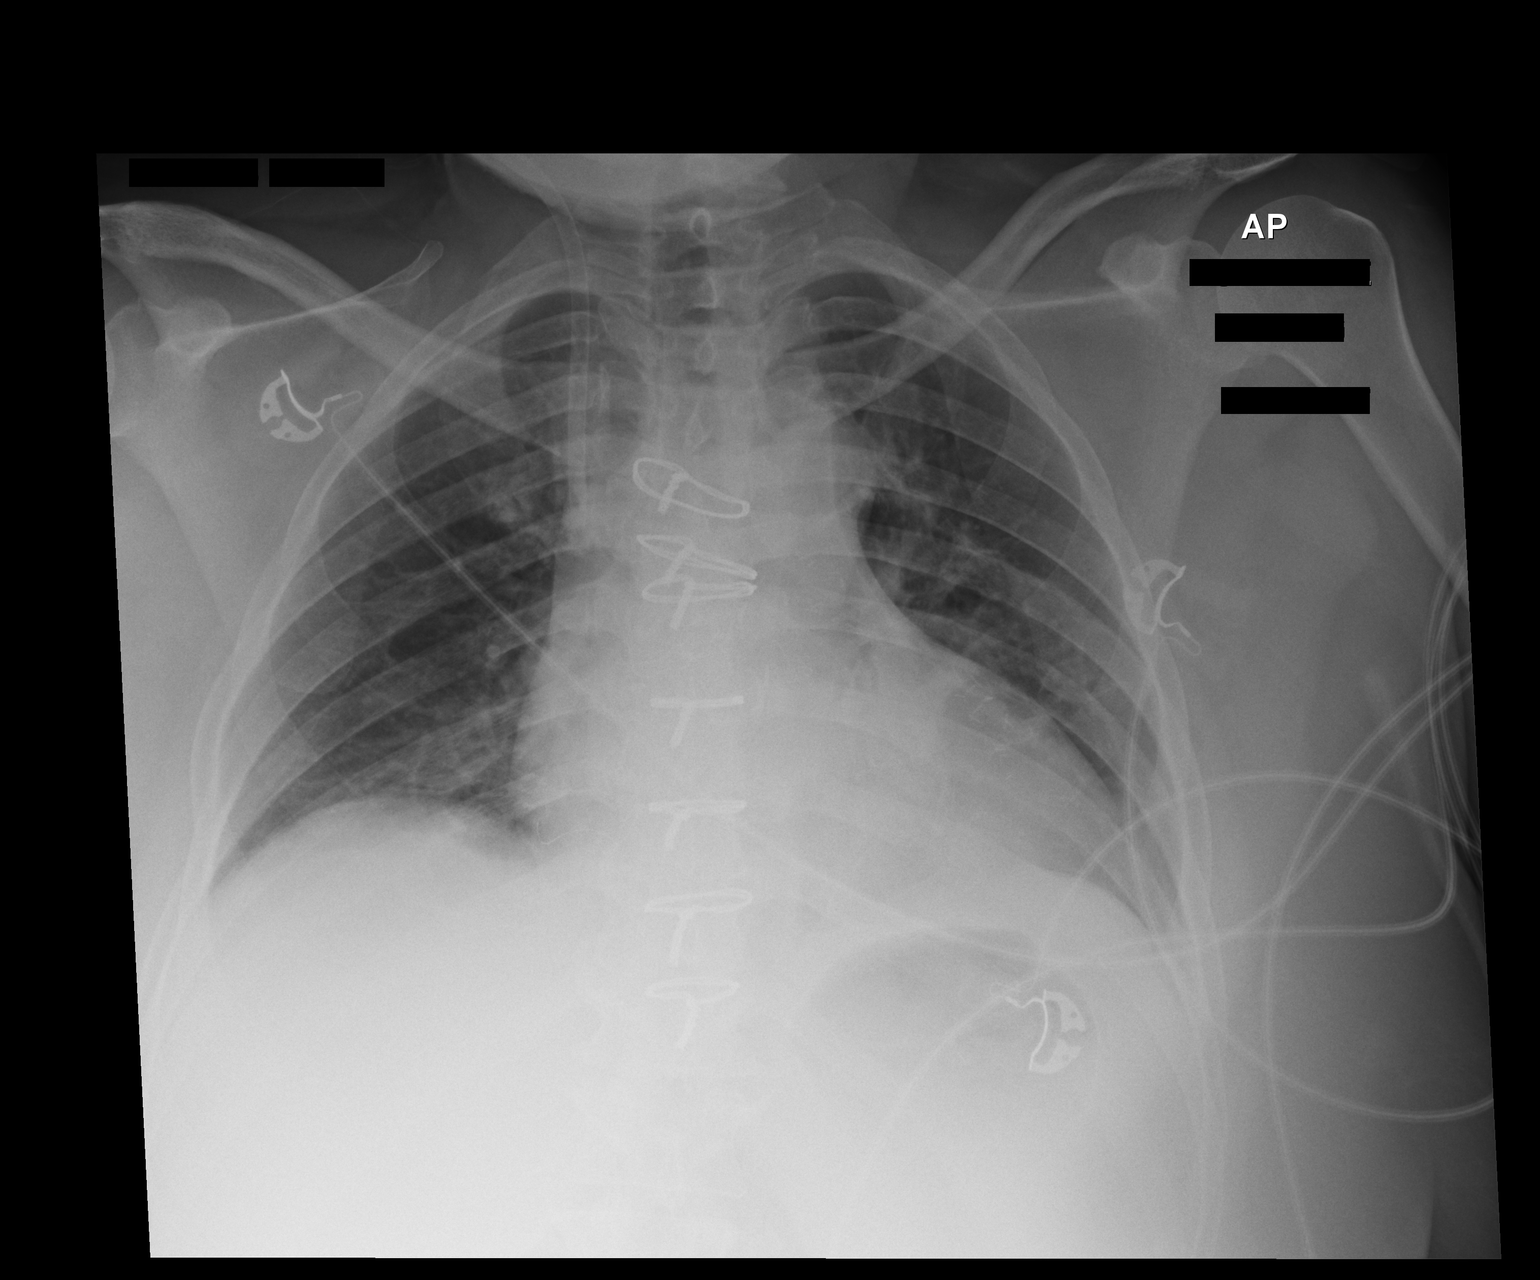

[1 of 1 positions shown; findings below may reference images not displayed]

FINDINGS: Low lung volumes with bibasilar atelectasis. Heart is mildly
enlarged. Interval removal Swan-Ganz catheter and left chest tube.
No pneumothorax.
IMPRESSION: Low volumes with bibasilar atelectasis. Some improvement in aeration
since prior study.

## 2018-03-16 ENCOUNTER — Ambulatory Visit (INDEPENDENT_AMBULATORY_CARE_PROVIDER_SITE_OTHER): Payer: Self-pay | Admitting: Cardiology

## 2018-03-16 ENCOUNTER — Encounter: Payer: Self-pay | Admitting: Cardiology

## 2018-03-16 VITALS — BP 142/80 | HR 79 | Ht 62.0 in | Wt 203.8 lb

## 2018-03-16 DIAGNOSIS — I2510 Atherosclerotic heart disease of native coronary artery without angina pectoris: Secondary | ICD-10-CM

## 2018-03-16 DIAGNOSIS — I10 Essential (primary) hypertension: Secondary | ICD-10-CM

## 2018-03-16 DIAGNOSIS — E118 Type 2 diabetes mellitus with unspecified complications: Secondary | ICD-10-CM

## 2018-03-16 NOTE — Patient Instructions (Signed)
Medication Instructions:  Stop PLAVIX   RESTART ASPIRIN 81 MG DAILY    Labwork: I WILL REQUEST LABS FROM PCP   Testing/Procedures: NONE  Follow-Up: Your physician wants you to follow-up in: 6 MONTHS .  You will receive a reminder letter in the mail two months in advance. If you don't receive a letter, please call our office to schedule the follow-up appointment.   Any Other Special Instructions Will Be Listed Below (If Applicable).     If you need a refill on your cardiac medications before your next appointment, please call your pharmacy.

## 2018-03-16 NOTE — Progress Notes (Signed)
Clinical Summary Ms. Snader is a 52 y.o.female seen today for follow up of the following medical problems.   1. CAD - admit 12/2016 with NSTEMI - cath showed LM disease, referred for CABG (LIMA-LAD, SVG-OM, SVG-RCA) - 03/2017 echo LVEF 65-70%, abnormal diastolic function.  - some discomfort at incision site with movement. No chest pressure or tightness - chronic SOB/DOE she relates to deconditionign.  - was not able to complete cardiac rehab due to insurance issues.  - compliant with meds   2. HTN - compliant with meds, but has not taken yet today  3. DM2 - followed by pcp    Past Medical History:  Diagnosis Date  . Stroke [facility])      Allergies  Allergen Reactions  . Penicillins Itching and Rash    Has patient had a PCN reaction causing immediate rash, facial/tongue/throat swelling, SOB or lightheadedness with hypotension: Yes Has patient had a PCN reaction causing severe rash involving mucus membranes or skin necrosis: Yes Has patient had a PCN reaction that required hospitalization Yes Has patient had a PCN reaction occurring within the last 10 years: No If all of the above answers are "NO", then may proceed with Cephalosporin use.      Current Outpatient Medications  Medication Sig Dispense Refill  . acetaminophen (TYLENOL) 325 MG tablet Take 2 tablets (650 mg total) by mouth every 6 (six) hours as needed for mild pain.    Marland Kitchen aspirin EC 81 MG EC tablet Take 1 tablet (81 mg total) by mouth daily.    Marland Kitchen atorvastatin (LIPITOR) 80 MG tablet Take 1 tablet (80 mg total) by mouth daily at 6 PM. 30 tablet 3  . clopidogrel (PLAVIX) 75 MG tablet Take 1 tablet (75 mg total) by mouth daily. 30 tablet 3  . lisinopril (PRINIVIL,ZESTRIL) 10 MG tablet Take 1 tablet (10 mg total) by mouth daily. 30 tablet 6  . metoprolol tartrate (LOPRESSOR) 25 MG tablet Take 0.5 tablets (12.5 mg total) by mouth 2 (two) times daily. 60 tablet 3  . oxyCODONE (OXY IR/ROXICODONE) 5 MG immediate  release tablet Take 1-2 tablets (5-10 mg total) by mouth every 3 (three) hours as needed for severe pain. (Patient not taking: Reported on 04/14/2017) 30 tablet 0   No current facility-administered medications for this visit.      Past Surgical History:  Procedure Laterality Date  . COLONOSCOPY N/A 05/26/2015   Procedure: COLONOSCOPY;  Surgeon: West Bali, MD;  Location: [facility] ENDO SUITE;  Service: Endoscopy;  Laterality: N/A;  2:00  . CORONARY ARTERY BYPASS GRAFT N/A 12/10/2016   Procedure: CORONARY ARTERY BYPASS GRAFTING (CABG) times three using the left internal mammary artery and the right greater saphenous vein;  Surgeon: Alleen Borne, MD;  Location: [facility] OR;  Service: Open Heart Surgery;  Laterality: N/A;  . LEFT HEART CATH AND CORONARY ANGIOGRAPHY N/A 12/10/2016   Procedure: Left Heart Cath and Coronary Angiography;  Surgeon: Lennette Bihari, MD;  Location: [facility] INVASIVE CV LAB;  Service: Cardiovascular;  Laterality: N/A;  . TEE WITHOUT CARDIOVERSION N/A 12/10/2016   Procedure: TRANSESOPHAGEAL ECHOCARDIOGRAM (TEE);  Surgeon: Alleen Borne, MD;  Location: [facility] OR;  Service: Open Heart Surgery;  Laterality: N/A;  . TUBAL LIGATION       Allergies  Allergen Reactions  . Penicillins Itching and Rash    Has patient had a PCN reaction causing immediate rash, facial/tongue/throat swelling, SOB or lightheadedness with hypotension: Yes Has patient had a PCN reaction causing  severe rash involving mucus membranes or skin necrosis: Yes Has patient had a PCN reaction that required hospitalization Yes Has patient had a PCN reaction occurring within the last 10 years: No If all of the above answers are "NO", then may proceed with Cephalosporin use.       Family History  Problem Relation Age of Onset  . Diabetes Father      Social History Ms. Ullery reports that she quit smoking about 15 months ago. Her smoking use included cigarettes. She has a 30.00 pack-year smoking history. She has never used  smokeless tobacco. Ms. Kruschke reports that she drinks alcohol.   Review of Systems CONSTITUTIONAL: No weight loss, fever, chills, weakness or fatigue.  HEENT: Eyes: No visual loss, blurred vision, double vision or yellow sclerae.No hearing loss, sneezing, congestion, runny nose or sore throat.  SKIN: No rash or itching.  CARDIOVASCULAR: per hpi RESPIRATORY: No shortness of breath, cough or sputum.  GASTROINTESTINAL: No anorexia, nausea, vomiting or diarrhea. No abdominal pain or blood.  GENITOURINARY: No burning on urination, no polyuria NEUROLOGICAL: No headache, dizziness, syncope, paralysis, ataxia, numbness or tingling in the extremities. No change in bowel or bladder control.  MUSCULOSKELETAL: chest wall pain  LYMPHATICS: No enlarged nodes. No history of splenectomy.  PSYCHIATRIC: No history of depression or anxiety.  ENDOCRINOLOGIC: No reports of sweating, cold or heat intolerance. No polyuria or polydipsia.  Marland Kitchen   Physical Examination Vitals:   03/16/18 1004  BP: (!) 142/80  Pulse: 79  SpO2: 98%   Vitals:   03/16/18 1004  Weight: 203 lb 12.8 oz (92.4 kg)  Height:  (1.575 m)    Gen: resting comfortably, no acute distress HEENT: no scleral icterus, pupils equal round and reactive, no palptable cervical adenopathy,  CV: RRR, no mr/g, no jvd Resp: Clear to auscultation bilaterally GI: abdomen is soft, non-tender, non-distended, normal bowel sounds, no hepatosplenomegaly MSK: extremities are warm, no edema.  Skin: warm, no rash Neuro:  no focal deficits Psych: appropriate affect   Diagnostic Studies 12/2016 cath  Ost RCA to Prox RCA lesion, 60 %stenosed.  Ost LM lesion, 95 %stenosed.  Prox LAD to Mid LAD lesion, 70 %stenosed.  The left ventricular systolic function is normal.  LV end diastolic pressure is normal.  The left ventricular ejection fraction is 55-65% by visual estimate.  Acute coronary syndrome with greater than 95% ostial left main stenosis,  70% proximal LAD stenosis, normal left circumflex coronary artery, and 60% very proximal RCA stenosis in a small RCA vessel.  Preserved global LV function with an ejection fraction at 55% and only a small region of subtle mild upper mid anterolateral hypocontractility.  The patient's transient ECG changes were concordant with left main disease and diffuse global ischemia.  Post cath RECOMMENDATION: Patient was evaluated by Dr. Laneta Simmers will be transported directly from the cardiac catheterization laboratory to undergo emergent CABG revascularization surgery.  12/2016 TEE  Left ventricle: LV systolic function is low normal with an EF of 50-55%.  Aortic valve: No regurgitation.  Mitral valve: Trace regurgitation   03/2017 echo Study Conclusions  - Left ventricle: The cavity size was normal. Wall thickness was   increased in a pattern of moderate LVH. Systolic function was   vigorous. The estimated ejection fraction was in the range of 65%   to 70%. Diastolic function is abnormal, indeterminant grade. Wall   motion was normal; there were no regional wall motion   abnormalities. - Aortic valve: Valve area (VTI): 3.71  cm^2. Valve area (Vmax):   2.71 cm^2. - Technically adequate study.   Assessment and Plan  1. CAD - no recent symptoms - she has completed 1 year of DAPT since her CABG after presenting with NSTEMI - stop plavix, continue ASA  daily.  - EKG shows SR, no ischemic changes  2. HTN - elevated in clinic, has not taken meds yet today.  - we will continue to monitor this time  3. DM2 - followed by pcp - from cardiac standpoint she is on statin and ACE-I  F/u 6 months       Antoine Poche, M.D.

## 2018-05-29 DIAGNOSIS — Z736 Limitation of activities due to disability: Secondary | ICD-10-CM

## 2018-06-08 ENCOUNTER — Other Ambulatory Visit (HOSPITAL_COMMUNITY): Payer: Self-pay | Admitting: Physician Assistant

## 2018-06-08 DIAGNOSIS — Z1231 Encounter for screening mammogram for malignant neoplasm of breast: Secondary | ICD-10-CM

## 2018-06-11 ENCOUNTER — Ambulatory Visit (HOSPITAL_COMMUNITY): Payer: Medicaid Other

## 2018-07-27 ENCOUNTER — Other Ambulatory Visit (HOSPITAL_COMMUNITY): Payer: Self-pay | Admitting: Physician Assistant

## 2018-07-27 DIAGNOSIS — M79605 Pain in left leg: Principal | ICD-10-CM

## 2018-07-27 DIAGNOSIS — M79604 Pain in right leg: Secondary | ICD-10-CM

## 2018-07-30 ENCOUNTER — Other Ambulatory Visit (HOSPITAL_COMMUNITY): Payer: Self-pay | Admitting: Physician Assistant

## 2018-07-30 DIAGNOSIS — M79662 Pain in left lower leg: Secondary | ICD-10-CM

## 2018-07-30 DIAGNOSIS — M79605 Pain in left leg: Principal | ICD-10-CM

## 2018-07-30 DIAGNOSIS — M79661 Pain in right lower leg: Secondary | ICD-10-CM

## 2018-07-30 DIAGNOSIS — M79604 Pain in right leg: Secondary | ICD-10-CM

## 2018-07-31 ENCOUNTER — Ambulatory Visit (HOSPITAL_COMMUNITY)
Admission: RE | Admit: 2018-07-31 | Discharge: 2018-07-31 | Disposition: A | Discharge status: Home / Self Care | Payer: Medicaid Other | Source: Ambulatory Visit | Attending: Physician Assistant | Admitting: Physician Assistant

## 2018-07-31 DIAGNOSIS — M79661 Pain in right lower leg: Secondary | ICD-10-CM

## 2018-07-31 DIAGNOSIS — M79605 Pain in left leg: Secondary | ICD-10-CM

## 2018-07-31 DIAGNOSIS — M79604 Pain in right leg: Secondary | ICD-10-CM

## 2018-07-31 DIAGNOSIS — M79662 Pain in left lower leg: Secondary | ICD-10-CM | POA: Insufficient documentation

## 2018-08-17 ENCOUNTER — Encounter: Payer: Medicaid Other | Admitting: Vascular Surgery

## 2018-08-24 ENCOUNTER — Encounter: Payer: Self-pay | Admitting: Vascular Surgery

## 2018-08-24 ENCOUNTER — Ambulatory Visit (INDEPENDENT_AMBULATORY_CARE_PROVIDER_SITE_OTHER): Payer: Self-pay | Admitting: Vascular Surgery

## 2018-08-24 VITALS — BP 174/89 | HR 65 | Temp 97.7°F | Resp 20 | Ht 62.0 in | Wt 201.4 lb

## 2018-08-24 DIAGNOSIS — M25561 Pain in right knee: Secondary | ICD-10-CM

## 2018-08-24 DIAGNOSIS — M25562 Pain in left knee: Secondary | ICD-10-CM

## 2018-08-24 NOTE — Progress Notes (Signed)
[facility] [place]  Patient name: Cindy Garrett MRN: [id number] DOB: February 02, 1966 Sex: female  REASON FOR CONSULT: Evaluation of lower extremity discomfort possible claudication  Seen today in our [place] office  HPI: Cindy Garrett is a 52 y.o. female, who is here today for evaluation.  She reports pain in both lower extremities.  This is present at walking and at rest.  She reports is worse with walking.  She reports that it is in both legs from her hips and buttocks down into her knees and calves.  She reports that it is worse in her groins bilaterally.  She has no history of lower extremity tissue loss.  She reports that this is been progressive over the past 6 months.  She does not have any specific prior history of degenerative disc disease.  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  She did have Wylie Russon onset of coronary disease having undergone coronary artery bypass grafting in 2018 and she did quit smoking following this.  She does deny any strong family history of premature atherosclerotic cardiac disease  Past Medical History:  Diagnosis Date  . Stroke [facility])     Family History  Problem Relation Age of Onset  . Diabetes Father     SOCIAL HISTORY: Social History   Socioeconomic History  . Marital status: Legally Separated    Spouse name: Not on file  . Number of children: Not on file  . Years of education: Not on file  . Highest education level: Not on file  Occupational History  . Not on file  Social Needs  . Financial resource strain: Not on file  . Food insecurity:    Worry: Not on file    Inability: Not on file  . Transportation needs:    Medical: Not on file    Non-medical: Not on file  Tobacco Use  . Smoking status: Former Smoker    Packs/day: 1.00    Years: 30.00    Pack years: 30.00   Types: Cigarettes    Last attempt to quit: 12/10/2016    Years since quitting: 1.7  . Smokeless tobacco: Never Used  Substance and Sexual Activity  . Alcohol use: Yes    Comment: rarely  . Drug use: No  . Sexual activity: Yes    Birth control/protection: Surgical  Lifestyle  . Physical activity:    Days per week: Not on file    Minutes per session: Not on file  . Stress: Not on file  Relationships  . Social connections:    Talks on phone: Not on file    Gets together: Not on file    Attends religious service: Not on file    Active member of club or organization: Not on file    Attends meetings of clubs or organizations: Not on file    Relationship status: Not on file  . Intimate partner violence:    Fear of current or ex partner: Not on file    Emotionally abused: Not on file    Physically abused: Not on file    Forced sexual activity: Not on file  Other Topics Concern  . Not on file  Social History Narrative  . Not on file    Allergies  Allergen Reactions  . Penicillins Itching and Rash    Has patient had a PCN reaction causing immediate rash, facial/tongue/throat  swelling, SOB or lightheadedness with hypotension: Yes Has patient had a PCN reaction causing severe rash involving mucus membranes or skin necrosis: Yes Has patient had a PCN reaction that required hospitalization Yes Has patient had a PCN reaction occurring within the last 10 years: No If all of the above answers are "NO", then may proceed with Cephalosporin use.     Current Outpatient Medications  Medication Sig Dispense Refill  . meloxicam (MOBIC) 7.5 MG tablet Take 7.5 mg by mouth daily.    Marland Kitchen aspirin EC 81 MG EC tablet Take 1 tablet (81 mg total) by mouth daily. (Patient not taking: Reported on 03/16/2018)    . atorvastatin (LIPITOR) 80 MG tablet Take 1 tablet (80 mg total) by mouth daily at 6 PM. 30 tablet 3  . lisinopril (PRINIVIL,ZESTRIL) 10 MG tablet Take 1 tablet (10 mg total) by mouth daily. 30  tablet 6  . metFORMIN (GLUCOPHAGE) 500 MG tablet Take 1 tablet by mouth daily.  5  . metoprolol tartrate (LOPRESSOR) 25 MG tablet Take 25 mg by mouth 2 (two) times daily.     No current facility-administered medications for this visit.     REVIEW OF SYSTEMS:  [X]  denotes positive finding, [ ]  denotes negative finding Cardiac  Comments:  Chest pain or chest pressure:    Shortness of breath upon exertion: x   Short of breath when lying flat:    Irregular heart rhythm:        Vascular    Pain in calf, thigh, or hip brought on by ambulation: x   Pain in feet at night that wakes you up from your sleep:     Blood clot in your veins:    Leg swelling:  x       Pulmonary    Oxygen at home:    Productive cough:  x   Wheezing:  x       Neurologic    Sudden weakness in arms or legs:  x   Sudden numbness in arms or legs:     Sudden onset of difficulty speaking or slurred speech:    Temporary loss of vision in one eye:     Problems with dizziness:  x       Gastrointestinal    Blood in stool:     Vomited blood:         Genitourinary    Burning when urinating:  x   Blood in urine:        Psychiatric    Major depression:  x       Hematologic    Bleeding problems:    Problems with blood clotting too easily:        Skin    Rashes or ulcers:        Constitutional    Fever or chills:      PHYSICAL EXAM: Vitals:   08/24/18 0910  BP: (!) 174/89  Pulse: 65  Resp: 20  Temp: 97.7 F (36.5 C)  TempSrc: Temporal  Weight: 201 lb 6.4 oz (91.4 kg)  Height: 5\' 2"  (1.575 m)    GENERAL: The patient is a well-nourished female, in no acute distress. The vital signs are documented above. CARDIOVASCULAR: Healed sternotomy incision.  2+ radial, 2+ femoral, 2+ popliteal, 2+ dorsalis pedis and 2+ posterior tibial pulses bilaterally.  Carotid artery is without bruits bilaterally. PULMONARY: There is good air exchange  ABDOMEN: Soft and non-tender  MUSCULOSKELETAL: There are no major  deformities or cyanosis. NEUROLOGIC: No  focal weakness or paresthesias are detected. SKIN: There are no ulcers or rashes noted. PSYCHIATRIC: The patient has a normal affect.  DATA:  Noninvasive studies from [facility] reviewed.  This shows normal ankle arm index and normal arterial waveforms bilaterally.  There was some suggestion of mild and significant tibial disease  MEDICAL ISSUES: Because these findings at length with the patient.  Explained that she does not have any evidence of lower extremity arterial insufficiency to account for her discomfort.  She was encouraged to continue her walking program.  Was congratulated on her smoking cessation I did explain that this is critical for her health.  See [place] again on an as needed   Larina Earthly, MD [facility] Tel [phone] Pager [phone]

## 2018-09-14 ENCOUNTER — Ambulatory Visit: Payer: Self-pay | Admitting: Cardiology

## 2018-09-16 ENCOUNTER — Ambulatory Visit (INDEPENDENT_AMBULATORY_CARE_PROVIDER_SITE_OTHER): Payer: Self-pay | Admitting: Cardiology

## 2018-09-16 ENCOUNTER — Encounter: Payer: Self-pay | Admitting: Cardiology

## 2018-09-16 VITALS — BP 166/88 | HR 67 | Ht 65.0 in | Wt 203.0 lb

## 2018-09-16 DIAGNOSIS — E118 Type 2 diabetes mellitus with unspecified complications: Secondary | ICD-10-CM

## 2018-09-16 DIAGNOSIS — E782 Mixed hyperlipidemia: Secondary | ICD-10-CM

## 2018-09-16 DIAGNOSIS — I2510 Atherosclerotic heart disease of native coronary artery without angina pectoris: Secondary | ICD-10-CM

## 2018-09-16 DIAGNOSIS — I10 Essential (primary) hypertension: Secondary | ICD-10-CM

## 2018-09-16 MED ORDER — ROSUVASTATIN CALCIUM 20 MG PO TABS: 20.0000 mg | ORAL_TABLET | Freq: Every day | ORAL | 3 refills | Status: DC

## 2018-09-16 NOTE — Progress Notes (Signed)
Clinical Summary [name] is a 52 y.o.female  seen today for follow up of the following medical problems.  1. CAD - admit 12/2016 with NSTEMI - cath showed LM disease, referred for CABG (LIMA-LAD, SVG-OM, SVG-RCA) - 03/2017 echo LVEF 65-70%, abnormal diastolic function.   - - no recent chest pain. No SOB/DOE - compliant with meds   2. HTN - has not taken meds yet, typically takes around noon   3. DM2 - followed by pcp - last Hgb A1c was <7  4. Leg pains - 07/2018 ABIs benign, was seen by vascular  5. Hyperlipidemia 06/2018 TC 176 TG 167 HDL 33 LDL 110 - compliant with statin  Past Medical History:  Diagnosis Date  . Stroke Cindy Garrett(HCC)      Allergies  Allergen Reactions  . Penicillins Itching and Rash    Has patient had a PCN reaction causing immediate rash, facial/tongue/throat swelling, SOB Garrett lightheadedness with hypotension: Yes Has patient had a PCN reaction causing severe rash involving mucus membranes Garrett skin necrosis: Yes Has patient had a PCN reaction that required hospitalization Yes Has patient had a PCN reaction occurring within the last 10 years: No If all of the above answers are "NO", then may proceed with Cephalosporin use.      Current Outpatient Medications  Medication Sig Dispense Refill  . aspirin EC 81 MG EC tablet Take 1 tablet (81 mg total) by mouth daily. (Patient not taking: Reported on 03/16/2018)    . atorvastatin (LIPITOR) 80 MG tablet Take 1 tablet (80 mg total) by mouth daily at 6 PM. 30 tablet 3  . lisinopril (PRINIVIL,ZESTRIL) 10 MG tablet Take 1 tablet (10 mg total) by mouth daily. 30 tablet 6  . meloxicam (MOBIC) 7.5 MG tablet Take 7.5 mg by mouth daily.    . metFORMIN (GLUCOPHAGE) 500 MG tablet Take 1 tablet by mouth daily.  5  . metoprolol tartrate (LOPRESSOR) 25 MG tablet Take 25 mg by mouth 2 (two) times daily.     No current facility-administered medications for this visit.      Past Surgical History:  Procedure  Laterality Date  . COLONOSCOPY N/A 05/26/2015   Procedure: COLONOSCOPY;  Surgeon: Cindy BaliSandi L Fields, MD;  Location: [facility];  Service: Endoscopy;  Laterality: N/A;  2:00  . CORONARY ARTERY BYPASS GRAFT N/A 12/10/2016   Procedure: CORONARY ARTERY BYPASS GRAFTING (CABG) times three using the left internal mammary artery and the right greater saphenous vein;  Surgeon: Cindy BorneBryan K Bartle, MD;  Location: [facility];  Service: Open Heart Surgery;  Laterality: N/A;  . LEFT HEART CATH AND CORONARY ANGIOGRAPHY N/A 12/10/2016   Procedure: Left Heart Cath and Coronary Angiography;  Surgeon: Cindy Biharihomas A Kelly, MD;  Location: Cindy [facility];  Service: Cardiovascular;  Laterality: N/A;  . TEE WITHOUT CARDIOVERSION N/A 12/10/2016   Procedure: TRANSESOPHAGEAL ECHOCARDIOGRAM (TEE);  Surgeon: Cindy BorneBryan K Bartle, MD;  Location: C[facility];  Service: Open Heart Surgery;  Laterality: N/A;  . TUBAL LIGATION       Allergies  Allergen Reactions  . Penicillins Itching and Rash    Has patient had a PCN reaction causing immediate rash, facial/tongue/throat swelling, SOB Garrett lightheadedness with hypotension: Yes Has patient had a PCN reaction causing severe rash involving mucus membranes Garrett skin necrosis: Yes Has patient had a PCN reaction that required hospitalization Yes Has patient had a PCN reaction occurring within the last 10 years: No If all of the above answers are "NO", then  may proceed with Cephalosporin use.       Family History  Problem Relation Age of Onset  . Diabetes Father      Social History [name] reports that she quit smoking about 21 months ago. Her smoking use included cigarettes. She has a 30.00 pack-year smoking history. She has never used smokeless tobacco. [name] reports that she drinks alcohol.   Review of Systems CONSTITUTIONAL: No weight loss, fever, chills, weakness Garrett fatigue.  HEENT: Eyes: No visual loss, blurred vision, double vision Garrett yellow sclerae.No hearing loss, sneezing, congestion,  runny nose Garrett sore throat.  SKIN: No rash Garrett itching.  CARDIOVASCULAR: per hpi RESPIRATORY: No shortness of breath, cough Garrett sputum.  GASTROINTESTINAL: No anorexia, nausea, vomiting Garrett diarrhea. No abdominal pain Garrett blood.  GENITOURINARY: No burning on urination, no polyuria NEUROLOGICAL: No headache, dizziness, syncope, paralysis, ataxia, numbness Garrett tingling in the extremities. No change in bowel Garrett bladder control.  MUSCULOSKELETAL: No muscle, back pain, joint pain Garrett stiffness.  LYMPHATICS: No enlarged nodes. No history of splenectomy.  PSYCHIATRIC: No history of depression Garrett anxiety.  ENDOCRINOLOGIC: No reports of sweating, cold Garrett heat intolerance. No polyuria Garrett polydipsia.  Marland Kitchen   Physical Examination Vitals:   09/16/18 0949  BP: (!) 166/88  Pulse: 67  SpO2: 98%   Vitals:   09/16/18 0949  Weight: 203 lb (92.1 kg)  Height: 5\' 5"  (1.651 m)    Gen: resting comfortably, no acute distress HEENT: no scleral icterus, pupils equal round and reactive, no palptable cervical adenopathy,  CV: RRR, 2/6 systolic murmur rusb, no jvd Resp: Clear to auscultation bilaterally GI: abdomen is soft, non-tender, non-distended, normal bowel sounds, no hepatosplenomegaly MSK: extremities are warm, no edema.  Skin: warm, no rash Neuro:  no focal deficits Psych: appropriate affect   Diagnostic Studies 12/2016 cath  Ost RCA to Prox RCA lesion, 60 %stenosed.  Ost LM lesion, 95 %stenosed.  Prox LAD to Mid LAD lesion, 70 %stenosed.  The left ventricular systolic function is normal.  LV end diastolic pressure is normal.  The left ventricular ejection fraction is 55-65% by visual estimate.  Acute coronary syndrome with greater than 95% ostial left main stenosis, 70% proximal LAD stenosis, normal left circumflex coronary artery, and 60% very proximal RCA stenosis in a small RCA vessel.  Preserved global LV function with an ejection fraction at 55% and only a small region of subtle mild  upper mid anterolateral hypocontractility.  The patient's transient ECG changes were concordant with left main disease and diffuse global ischemia.  Post cathRECOMMENDATION: Patient was evaluated by Dr. Laneta Simmers will be transported directly from the cardiac catheterization laboratory to undergo emergent CABG revascularization surgery.  12/2016 TEE  Left ventricle: LV systolic function is low normal with an EF of 50-55%.  Aortic valve: No regurgitation.  Mitral valve: Trace regurgitation   03/2017 echo Study Conclusions  - Left ventricle: The cavity size was normal. Wall thickness was increased in a pattern of moderate LVH. Systolic function was vigorous. The estimated ejection fraction was in the range of 65% to 70%. Diastolic function is abnormal, indeterminant grade. Wall motion was normal; there were no regional wall motion abnormalities. - Aortic valve: Valve area (VTI): 3.71 cm^2. Valve area (Vmax): 2.71 cm^2. - Technically adequate study.  07/2018 ABIs FINDINGS: Right ABI:  1.18  Left ABI:  1.13  Right Lower Extremity: Segmental Doppler of the right lower extremity demonstrates monophasic dorsalis pedis and triphasic posterior tibial waveforms.  Left Lower Extremity:  Segmental Doppler at the left ankle demonstrates triphasic posterior tibial artery and dorsalis pedis.  IMPRESSION: Right:  Resting ABI within normal limits.  Segmental exam on the right demonstrates evidence of developing anterior tibial disease.  Left:  Resting ABI within normal limits. Segmental exam demonstrates no evidence of significant arterial occlusive disease.  Signed,   Assessment and Plan  1. CAD - no symptoms, continue current meds  2. HTN - elevated in clinic, has not taken meds yet today.  - difficult to get a grasp on her bp control as over the last few visits has not taken meds prior to our appointment. Has pcp appt coming up in 2 weeks, asked  to take meds at least 1 hour before going. Her goal bp is <130/80, would increase lisinopril if needed.   3. DM2 - followed by pcp - continue ACE and statin from cardiac standpoint.   4. Hyperlipidemia - not at goal based on 06/2018 labs on care everywhere, goal LDL <70 - d/c atorvastatin, start crestor 20mg  daily   F/u 6 months      Antoine Poche, M.D.

## 2018-09-16 NOTE — Patient Instructions (Signed)
Medication Instructions:  STOP LIPITOR   START CRESTOR 20 MG DAILY   Labwork: NONE  Testing/Procedures: NONE  Follow-Up: Your physician wants you to follow-up in: 6 MONTHS.  You will receive a reminder letter in the mail two months in advance. If you don't receive a letter, please call our office to schedule the follow-up appointment.   Any Other Special Instructions Will Be Listed Below (If Applicable).     If you need a refill on your cardiac medications before your next appointment, please call your pharmacy.

## 2019-03-31 ENCOUNTER — Telehealth: Payer: Self-pay | Admitting: Cardiology

## 2019-03-31 NOTE — Telephone Encounter (Signed)
Virtual Visit Pre-Appointment Phone Call  "(Name), I am calling you today to discuss your upcoming appointment. We are currently trying to limit exposure to the virus that causes COVID-19 by seeing patients at home rather than in the office."  1. "What is the BEST phone number to call the day of the visit?" - include this in appointment notes  2. Do you have or have access to (through a family member/friend) a smartphone with video capability that we can use for your visit?" a. If yes - list this number in appt notes as cell (if different from BEST phone #) and list the appointment type as a VIDEO visit in appointment notes b. If no - list the appointment type as a PHONE visit in appointment notes  3. Confirm consent - "In the setting of the current Covid19 crisis, you are scheduled for a (phone or video) visit with your provider on (date) at (time).  Just as we do with many in-office visits, in order for you to participate in this visit, we must obtain consent.  If you'd like, I can send this to your mychart (if signed up) or email for you to review.  Otherwise, I can obtain your verbal consent now.  All virtual visits are billed to your insurance company just like a normal visit would be.  By agreeing to a virtual visit, we'd like you to understand that the technology does not allow for your provider to perform an examination, and thus may limit your provider's ability to fully assess your condition. If your provider identifies any concerns that need to be evaluated in person, we will make arrangements to do so.  Finally, though the technology is pretty good, we cannot assure that it will always work on either your or our end, and in the setting of a video visit, we may have to convert it to a phone-only visit.  In either situation, we cannot ensure that we have a secure connection.  Are you willing to proceed?" STAFF: Did the patient verbally acknowledge consent to telehealth visit? Document  YES/NO here: Yes  4. Advise patient to be prepared - "Two hours prior to your appointment, go ahead and check your blood pressure, pulse, oxygen saturation, and your weight (if you have the equipment to check those) and write them all down. When your visit starts, your provider will ask you for this information. If you have an Apple Watch or Kardia device, please plan to have heart rate information ready on the day of your appointment. Please have a pen and paper handy nearby the day of the visit as well."  5. Give patient instructions for MyChart download to smartphone OR [email or web address] as below if video visit (depending on what platform provider is using)  6. Inform patient they will receive a phone call 15 minutes prior to their appointment time (may be from unknown caller ID) so they should be prepared to answer    TELEPHONE CALL NOTE  Cindy Garrett has been deemed a candidate for a follow-up tele-health visit to limit community exposure during the Covid-19 pandemic. I spoke with the patient via phone to ensure availability of phone/video source, confirm preferred email & phone number, and discuss instructions and expectations.  I reminded Cindy Garrett to be prepared with any vital sign and/or heart rhythm information that could potentially be obtained via home monitoring, at the time of her visit. I reminded Cindy Garrett to expect a phone call prior to her visit.  Dyane Dustmanerry L Goins 03/31/2019 3:38 PM

## 2019-04-05 ENCOUNTER — Telehealth (INDEPENDENT_AMBULATORY_CARE_PROVIDER_SITE_OTHER): Payer: Self-pay | Admitting: Cardiology

## 2019-04-05 ENCOUNTER — Other Ambulatory Visit: Payer: Self-pay

## 2019-04-05 DIAGNOSIS — I2510 Atherosclerotic heart disease of native coronary artery without angina pectoris: Secondary | ICD-10-CM

## 2019-04-05 DIAGNOSIS — I10 Essential (primary) hypertension: Secondary | ICD-10-CM

## 2019-04-05 DIAGNOSIS — E782 Mixed hyperlipidemia: Secondary | ICD-10-CM

## 2019-04-05 MED ORDER — ATORVASTATIN CALCIUM 40 MG PO TABS: 40.0000 mg | ORAL_TABLET | Freq: Every day | ORAL | 11 refills | Status: DC

## 2019-04-05 NOTE — Progress Notes (Signed)
Virtual Visit via Telephone Note   This visit type was conducted due to national recommendations for restrictions regarding the COVID-19 Pandemic (e.g. social distancing) in an effort to limit this patient's exposure and mitigate transmission in our community.  Due to her co-morbid illnesses, this patient is at least at moderate risk for complications without adequate follow up.  This format is felt to be most appropriate for this patient at this time.  The patient did not have access to video technology/had technical difficulties with video requiring transitioning to audio format only (telephone).  All issues noted in this document were discussed and addressed.  No physical exam could be performed with this format.  Please refer to the patient's chart for her  consent to telehealth for [facility].   Date:  04/05/2019   ID:  Cindy Garrett, DOB 11/06/1965, MRN [id number]  Patient Location: Home Provider Location: Home  PCP:  Joette CatchingNyland, Leonard, MD  Cardiologist:  Dina RichBranch, Zeriah Baysinger, MD  Electrophysiologist:  None   Evaluation Performed:  Follow-Up Visit  Chief Complaint:  6 month follow up  History of Present Illness:    Cindy Garrett is a 53 y.o. female seen today for follow up of the following medical problems.  1. CAD - admit 12/2016 with NSTEMI - cath showed LM disease, referred for CABG (LIMA-LAD, SVG-OM, SVG-RCA) - 03/2017 echo LVEF 65-70%, abnormal diastolic function.   -no recent chest pain. No SOB/DOE - mixed compliance with meds, difficultly affording sometimes   2. HTN  - home 182/79 before taking meds.   3. DM2 - followed by pcp  4. Leg pains - 07/2018 ABIs benign, was seen by vascular  5. Hyperlipidemia 06/2018 TC 176 TG 167 HDL 33 LDL 110 - we prevoiusly changed from atorvastatin to crestor as LDL was not quite at goal - difficultly affording meds, unclear how much the crestor is costing her.   Upcoming labs with pcp  The patient does not have  symptoms concerning for COVID-19 infection (fever, chills, cough, or new shortness of breath).    Past Medical History:  Diagnosis Date  . Stroke [facility](HCC)    Past Surgical History:  Procedure Laterality Date  . COLONOSCOPY N/A 05/26/2015   Procedure: COLONOSCOPY;  Surgeon: West BaliSandi L Fields, MD;  Location: [facility] ENDO SUITE;  Service: Endoscopy;  Laterality: N/A;  2:00  . CORONARY ARTERY BYPASS GRAFT N/A 12/10/2016   Procedure: CORONARY ARTERY BYPASS GRAFTING (CABG) times three using the left internal mammary artery and the right greater saphenous vein;  Surgeon: Alleen BorneBryan K Bartle, MD;  Location: [facility] OR;  Service: Open Heart Surgery;  Laterality: N/A;  . LEFT HEART CATH AND CORONARY ANGIOGRAPHY N/A 12/10/2016   Procedure: Left Heart Cath and Coronary Angiography;  Surgeon: Lennette Biharihomas A Kelly, MD;  Location: [facility] INVASIVE CV LAB;  Service: Cardiovascular;  Laterality: N/A;  . TEE WITHOUT CARDIOVERSION N/A 12/10/2016   Procedure: TRANSESOPHAGEAL ECHOCARDIOGRAM (TEE);  Surgeon: Alleen BorneBryan K Bartle, MD;  [facility];  Service: Open Heart Surgery;  Laterality: N/A;  . TUBAL LIGATION       No outpatient medications have been marked as taking for the 04/05/19 encounter (Appointment) with Antoine PocheBranch, Esteven Overfelt F, MD.     Allergies:   Penicillins   Social History   Tobacco Use  . Smoking status: Former Smoker    Packs/day: 1.00    Years: 30.00    Pack years: 30.00    Types: Cigarettes    Last attempt to quit: 12/10/2016    Years  since quitting: 2.3  . Smokeless tobacco: Never Used  Substance Use Topics  . Alcohol use: Not Currently    Comment: rarely  . Drug use: No     Family Hx: The patient's family history includes Diabetes in her father.  ROS:   Please see the history of present illness.     All other systems reviewed and are negative.   Prior CV studies:   The following studies were reviewed today:  12/2016 cath  Ost RCA to Prox RCA lesion, 60 %stenosed.  Ost LM lesion, 95 %stenosed.  Prox LAD to  Mid LAD lesion, 70 %stenosed.  The left ventricular systolic function is normal.  LV end diastolic pressure is normal.  The left ventricular ejection fraction is 55-65% by visual estimate.  Acute coronary syndrome with greater than 95% ostial left main stenosis, 70% proximal LAD stenosis, normal left circumflex coronary artery, and 60% very proximal RCA stenosis in a small RCA vessel.  Preserved global LV function with an ejection fraction at 55% and only a small region of subtle mild upper mid anterolateral hypocontractility.  The patient's transient ECG changes were concordant with left main disease and diffuse global ischemia.  Post cathRECOMMENDATION: Patient was evaluated by Dr. Laneta Simmers will be transported directly from the cardiac catheterization laboratory to undergo emergent CABG revascularization surgery.  12/2016 TEE  Left ventricle: LV systolic function is low normal with an EF of 50-55%.  Aortic valve: No regurgitation.  Mitral valve: Trace regurgitation   03/2017 echo Study Conclusions  - Left ventricle: The cavity size was normal. Wall thickness was increased in a pattern of moderate LVH. Systolic function was vigorous. The estimated ejection fraction was in the range of 65% to 70%. Diastolic function is abnormal, indeterminant grade. Wall motion was normal; there were no regional wall motion abnormalities. - Aortic valve: Valve area (VTI): 3.71 cm^2. Valve area (Vmax): 2.71 cm^2. - Technically adequate study.  07/2018 ABIs FINDINGS: Right ABI: 1.18  Left ABI: 1.13  Right Lower Extremity: Segmental Doppler of the right lower extremity demonstrates monophasic dorsalis pedis and triphasic posterior tibial waveforms.  Left Lower Extremity: Segmental Doppler at the left ankle demonstrates triphasic posterior tibial artery and dorsalis pedis.  IMPRESSION: Right:  Resting ABI within normal limits.  Segmental exam on the right  demonstrates evidence of developing anterior tibial disease.  Left:  Resting ABI within normal limits. Segmental exam demonstrates no evidence of significant arterial occlusive disease.    Labs/Other Tests and Data Reviewed:    EKG:  No ECG reviewed.  Recent Labs: No results found for requested labs within last 8760 hours.   Recent Lipid Panel Lab Results  Component Value Date/Time   CHOL 123 12/12/2016 04:30 AM   TRIG 195 (H) 12/12/2016 04:30 AM   HDL 25 (L) 12/12/2016 04:30 AM   CHOLHDL 4.9 12/12/2016 04:30 AM   LDLCALC 59 12/12/2016 04:30 AM    Wt Readings from Last 3 Encounters:  09/16/18 203 lb (92.1 kg)  08/24/18 201 lb 6.4 oz (91.4 kg)  03/16/18 203 lb 12.8 oz (92.4 kg)     Objective:    Vital Signs:  There were no vitals filed for this visit. There is no height or weight on file to calculate BMI.   Normal affect. Norm  ASSESSMENT & PLAN:    1. CAD - denies any symptoms, continue current meds  2. HTN - home bp elevated but has not taken meds yet. Some mixed compliance with meds due to costs.  Continue lopresor and lisinopril.   3. DM2 - followed by pcp   4. Hyperlipidemia - appears crestor may be an issue as far as cost, will look into how much costing her out of pocket, may need to change back to atorvastatin    COVID-19 Education: The signs and symptoms of COVID-19 were discussed with the patient and how to seek care for testing (follow up with PCP or arrange E-visit).  The importance of social distancing was discussed today.  Time:   Today, I have spent 17 minutes with the patient with telehealth technology discussing the above problems.     Medication Adjustments/Labs and Tests Ordered: Current medicines are reviewed at length with the patient today.  Concerns regarding medicines are outlined above.   Tests Ordered: No orders of the defined types were placed in this encounter.   Medication Changes: No orders of the defined types  were placed in this encounter.   Disposition:  Follow up 6 months  Signed, Dina Rich, MD  04/05/2019 8:11 AM    [facility]

## 2019-04-05 NOTE — Patient Instructions (Addendum)
Medication Instructions:  Your physician recommends that you continue on your current medications as directed. Please refer to the Current Medication list given to you today.  Stop Taking Crestor   Start Taking Atorvastatin 40 mg Daily   Labwork: NONE  Testing/Procedures: NONE  Follow-Up: Your physician wants you to follow-up in: 6 Months with Dr. Wyline Mood. You will receive a reminder letter in the mail two months in advance. If you don't receive a letter, please call our office to schedule the follow-up appointment.   Any Other Special Instructions Will Be Listed Below (If Applicable).     If you need a refill on your cardiac medications before your next appointment, please call your pharmacy.  Thank you for choosing [facility]!

## 2019-04-05 NOTE — Addendum Note (Signed)
Addended by: Kerney Elbe on: 04/05/2019 10:46 AM   Modules accepted: Orders

## 2020-01-20 ENCOUNTER — Other Ambulatory Visit: Payer: Self-pay

## 2020-01-20 ENCOUNTER — Ambulatory Visit: Payer: Medicaid Other | Admitting: Family Medicine

## 2020-01-20 ENCOUNTER — Encounter: Payer: Self-pay | Admitting: Family Medicine

## 2020-01-20 VITALS — BP 159/85 | HR 94 | Temp 98.0°F | Ht 65.0 in | Wt 195.2 lb

## 2020-01-20 DIAGNOSIS — Z0000 Encounter for general adult medical examination without abnormal findings: Secondary | ICD-10-CM

## 2020-01-20 DIAGNOSIS — Z114 Encounter for screening for human immunodeficiency virus [HIV]: Secondary | ICD-10-CM

## 2020-01-20 DIAGNOSIS — Z23 Encounter for immunization: Secondary | ICD-10-CM

## 2020-01-20 DIAGNOSIS — I10 Essential (primary) hypertension: Secondary | ICD-10-CM | POA: Diagnosis not present

## 2020-01-20 DIAGNOSIS — Z8673 Personal history of transient ischemic attack (TIA), and cerebral infarction without residual deficits: Secondary | ICD-10-CM

## 2020-01-20 DIAGNOSIS — M1990 Unspecified osteoarthritis, unspecified site: Secondary | ICD-10-CM | POA: Diagnosis not present

## 2020-01-20 DIAGNOSIS — E785 Hyperlipidemia, unspecified: Secondary | ICD-10-CM

## 2020-01-20 DIAGNOSIS — E1165 Type 2 diabetes mellitus with hyperglycemia: Secondary | ICD-10-CM | POA: Diagnosis not present

## 2020-01-20 LAB — BAYER DCA HB A1C WAIVED: HB A1C (BAYER DCA - WAIVED): 8 % — ABNORMAL HIGH (ref <7.0)

## 2020-01-20 MED ORDER — TETANUS-DIPHTH-ACELL PERTUSSIS 5-2.5-18.5 LF-MCG/0.5 IM SUSP: 0.5000 mL | Freq: Once | INTRAMUSCULAR | 0 refills | Status: AC

## 2020-01-20 MED ORDER — CLOPIDOGREL BISULFATE 75 MG PO TABS: 75.0000 mg | ORAL_TABLET | Freq: Every day | ORAL | 1 refills | Status: DC

## 2020-01-20 MED ORDER — LISINOPRIL 10 MG PO TABS: 10.0000 mg | ORAL_TABLET | Freq: Every day | ORAL | 1 refills | Status: DC

## 2020-01-20 MED ORDER — SHINGRIX 50 MCG/0.5ML IM SUSR: 0.5000 mL | Freq: Once | INTRAMUSCULAR | 0 refills | Status: AC

## 2020-01-20 MED ORDER — MELOXICAM 7.5 MG PO TABS: 7.5000 mg | ORAL_TABLET | Freq: Every day | ORAL | 2 refills | Status: DC

## 2020-01-20 MED ORDER — METOPROLOL TARTRATE 25 MG PO TABS: 25.0000 mg | ORAL_TABLET | Freq: Every day | ORAL | 1 refills | Status: DC

## 2020-01-20 MED ORDER — ATORVASTATIN CALCIUM 40 MG PO TABS: 40.0000 mg | ORAL_TABLET | Freq: Every day | ORAL | 1 refills | Status: DC

## 2020-01-20 MED ORDER — METFORMIN HCL 500 MG PO TABS: 500.0000 mg | ORAL_TABLET | Freq: Two times a day (BID) | ORAL | 1 refills | Status: DC

## 2020-01-20 NOTE — Patient Instructions (Addendum)
Schedule diabetic eye exam!  Hand Exercises Hand exercises can be helpful for almost anyone. These exercises can strengthen the hands, improve flexibility and movement, and increase blood flow to the hands. These results can make work and daily tasks easier. Hand exercises can be especially helpful for people who have joint pain from arthritis or have nerve damage from overuse (carpal tunnel syndrome). These exercises can also help people who have injured a hand. Exercises Most of these hand exercises are gentle stretching and motion exercises. It is usually safe to do them often throughout the day. Warming up your hands before exercise may help to reduce stiffness. You can do this with gentle massage or by placing your hands in warm water for 10-15 minutes. It is normal to feel some stretching, pulling, tightness, or mild discomfort as you begin new exercises. This will gradually improve. Stop an exercise right away if you feel sudden, severe pain or your pain gets worse. Ask your health care provider which exercises are best for you. Knuckle bend or "claw" fist 1. Stand or sit with your arm, hand, and all five fingers pointed straight up. Make sure to keep your wrist straight during the exercise. 2. Gently bend your fingers down toward your palm until the tips of your fingers are touching the top of your palm. Keep your big knuckle straight and just bend the small knuckles in your fingers. 3. Hold this position for __________ seconds. 4. Straighten (extend) your fingers back to the starting position. Repeat this exercise 5-10 times with each hand. Full finger fist 1. Stand or sit with your arm, hand, and all five fingers pointed straight up. Make sure to keep your wrist straight during the exercise. 2. Gently bend your fingers into your palm until the tips of your fingers are touching the middle of your palm. 3. Hold this position for __________ seconds. 4. Extend your fingers back to the starting  position, stretching every joint fully. Repeat this exercise 5-10 times with each hand. Straight fist 1. Stand or sit with your arm, hand, and all five fingers pointed straight up. Make sure to keep your wrist straight during the exercise. 2. Gently bend your fingers at the big knuckle, where your fingers meet your hand, and the middle knuckle. Keep the knuckle at the tips of your fingers straight and try to touch the bottom of your palm. 3. Hold this position for __________ seconds. 4. Extend your fingers back to the starting position, stretching every joint fully. Repeat this exercise 5-10 times with each hand. Tabletop 1. Stand or sit with your arm, hand, and all five fingers pointed straight up. Make sure to keep your wrist straight during the exercise. 2. Gently bend your fingers at the big knuckle, where your fingers meet your hand, as far down as you can while keeping the small knuckles in your fingers straight. Think of forming a tabletop with your fingers. 3. Hold this position for __________ seconds. 4. Extend your fingers back to the starting position, stretching every joint fully. Repeat this exercise 5-10 times with each hand. Finger spread 1. Place your hand flat on a table with your palm facing down. Make sure your wrist stays straight as you do this exercise. 2. Spread your fingers and thumb apart from each other as far as you can until you feel a gentle stretch. Hold this position for __________ seconds. 3. Bring your fingers and thumb tight together again. Hold this position for __________ seconds. Repeat this exercise  5-10 times with each hand. Making circles 1. Stand or sit with your arm, hand, and all five fingers pointed straight up. Make sure to keep your wrist straight during the exercise. 2. Make a circle by touching the tip of your thumb to the tip of your index finger. 3. Hold for __________ seconds. Then open your hand wide. 4. Repeat this motion with your thumb and  each finger on your hand. Repeat this exercise 5-10 times with each hand. Thumb motion 1. Sit with your forearm resting on a table and your wrist straight. Your thumb should be facing up toward the ceiling. Keep your fingers relaxed as you move your thumb. 2. Lift your thumb up as high as you can toward the ceiling. Hold for __________ seconds. 3. Bend your thumb across your palm as far as you can, reaching the tip of your thumb for the small finger (pinkie) side of your palm. Hold for __________ seconds. Repeat this exercise 5-10 times with each hand. Grip strengthening  1. Hold a stress ball or other soft ball in the middle of your hand. 2. Slowly increase the pressure, squeezing the ball as much as you can without causing pain. Think of bringing the tips of your fingers into the middle of your palm. All of your finger joints should bend when doing this exercise. 3. Hold your squeeze for __________ seconds, then relax. Repeat this exercise 5-10 times with each hand. Contact a health care provider if:  Your hand pain or discomfort gets much worse when you do an exercise.  Your hand pain or discomfort does not improve within 2 hours after you exercise. If you have any of these problems, stop doing these exercises right away. Do not do them again unless your health care provider says that you can. Get help right away if:  You develop sudden, severe hand pain or swelling. If this happens, stop doing these exercises right away. Do not do them again unless your health care provider says that you can. This information is not intended to replace advice given to you by your health care provider. Make sure you discuss any questions you have with your health care provider. Document Revised: 02/11/2019 Document Reviewed: 10/22/2018 Elsevier Patient Education  [place].

## 2020-01-20 NOTE — Progress Notes (Signed)
New Patient Office Visit  Assessment & Plan:  1. Type 2 diabetes mellitus with hyperglycemia, without long-term current use of insulin ([place]) - Patient is currently taking a statin. Patient is taking an ACE-inhibitor/ARB.  - Last foot exam: 01/20/2020 - Last diabetic eye exam: unknown - Urine Microalbumin/Creat Ratio: 01/20/2020 - Instruction/counseling given: reminded to get eye exam and discussed foot care - Bayer DCA Hb A1c Waived - atorvastatin (LIPITOR) 40 MG tablet; Take 1 tablet (40 mg total) by mouth daily.  Dispense: 90 tablet; Refill: 1 - lisinopril (ZESTRIL) 10 MG tablet; Take 1 tablet (10 mg total) by mouth daily.  Dispense: 90 tablet; Refill: 1 - metFORMIN (GLUCOPHAGE) 500 MG tablet; Take 1 tablet (500 mg total) by mouth 2 (two) times daily with a meal.  Dispense: 180 tablet; Refill: 1 - CMP14+EGFR - Lipid panel - Microalbumin / creatinine urine ratio - Referred to podiatry due to thick toenails and diabetes.    2. Essential (primary) hypertension - lisinopril (ZESTRIL) 10 MG tablet; Take 1 tablet (10 mg total) by mouth daily.  Dispense: 90 tablet; Refill: 1 - metoprolol tartrate (LOPRESSOR) 25 MG tablet; Take 1 tablet (25 mg total) by mouth daily.  Dispense: 90 tablet; Refill: 1 - CMP14+EGFR - Lipid panel  3. Dyslipidemia - atorvastatin (LIPITOR) 40 MG tablet; Take 1 tablet (40 mg total) by mouth daily.  Dispense: 90 tablet; Refill: 1 - CMP14+EGFR - Lipid panel  4. Arthritis - meloxicam (MOBIC) 7.5 MG tablet; Take 1 tablet (7.5 mg total) by mouth daily.  Dispense: 90 tablet; Refill: 2  5. History of stroke - clopidogrel (PLAVIX) 75 MG tablet; Take 1 tablet (75 mg total) by mouth daily.  Dispense: 90 tablet; Refill: 1  6. Encounter for screening for HIV - HIV Antibody (routine testing w rflx)  7. Immunization due - Tdap (BOOSTRIX) 5-2.5-18.5 LF-MCG/0.5 injection; Inject 0.5 mLs into the muscle once for 1 dose.  Dispense: 0.5 mL; Refill: 0 - SHINGRIX injection;  Inject 0.5 mLs into the muscle once for 1 dose.  Dispense: 0.5 mL; Refill: 0  8. Healthcare maintenance - Mammogram scheduled on bus for 03/29/2020. Patient reports having pap smear with [facility], but I am unable to see this in Care Everywhere. TDAP & Shingrix sent to pharmacy for administration.    Follow-up: Return as directed after labs result.   Hendricks Limes, MSN, APRN, FNP-C [facility]  Subjective:  Patient ID: Cindy Garrett, female    DOB: 18-Dec-1965  Age: 54 y.o. MRN: [id number]  Patient Care Team: Loman Brooklyn, FNP as PCP - General (Family Medicine) Harl Bowie Alphonse Guild, MD as PCP - Cardiology (Cardiology)  CC:  Chief Complaint  Patient presents with  . New Patient (Initial Visit)    [place]   . Establish Care    HPI Cindy Garrett presents to establish care. She is transferring care from Dr. Murrell Redden office as he has retired and the office has closed.   Patient reports difficulty holding things and writing with her right hand. She frequently drops things. States that hand feels like cement. This is the side that was effected by the stroke.    Review of Systems  Constitutional: Negative for chills, fever, malaise/fatigue and weight loss.  HENT: Negative for congestion, ear discharge, ear pain, nosebleeds, sinus pain, sore throat and tinnitus.   Eyes: Negative for blurred vision, double vision, pain, discharge and redness.  Respiratory: Negative for cough, shortness of breath and wheezing.   Cardiovascular: Negative for chest pain,  palpitations and leg swelling.  Gastrointestinal: Negative for abdominal pain, constipation, diarrhea, heartburn, nausea and vomiting.  Genitourinary: Negative for dysuria, frequency and urgency.  Musculoskeletal: Negative for myalgias.  Skin: Negative for rash.  Neurological: Negative for dizziness, seizures, weakness and headaches.  Psychiatric/Behavioral: Negative for depression, substance abuse and suicidal ideas.  The patient is not nervous/anxious.     Current Outpatient Medications:  .  atorvastatin (LIPITOR) 40 MG tablet, Take 1 tablet (40 mg total) by mouth daily., Disp: 90 tablet, Rfl: 1 .  citalopram (CELEXA) 20 MG tablet, Take 20 mg by mouth daily., Disp: , Rfl: 2 .  clopidogrel (PLAVIX) 75 MG tablet, Take 1 tablet (75 mg total) by mouth daily., Disp: 90 tablet, Rfl: 1 .  lisinopril (ZESTRIL) 10 MG tablet, Take 1 tablet (10 mg total) by mouth daily., Disp: 90 tablet, Rfl: 1 .  meloxicam (MOBIC) 7.5 MG tablet, Take 1 tablet (7.5 mg total) by mouth daily., Disp: 90 tablet, Rfl: 2 .  metFORMIN (GLUCOPHAGE) 500 MG tablet, Take 1 tablet (500 mg total) by mouth 2 (two) times daily with a meal., Disp: 180 tablet, Rfl: 1 .  metoprolol tartrate (LOPRESSOR) 25 MG tablet, Take 1 tablet (25 mg total) by mouth daily., Disp: 90 tablet, Rfl: 1 .  traZODone (DESYREL) 100 MG tablet, TAKE 1 TABLET BY MOUTH ONCE DAILY AFTER A MEAL, Disp: , Rfl: 2  Allergies  Allergen Reactions  . Penicillins Itching and Rash    Has patient had a PCN reaction causing immediate rash, facial/tongue/throat swelling, SOB or lightheadedness with hypotension: Yes Has patient had a PCN reaction causing severe rash involving mucus membranes or skin necrosis: Yes Has patient had a PCN reaction that required hospitalization Yes Has patient had a PCN reaction occurring within the last 10 years: No If all of the above answers are "NO", then may proceed with Cephalosporin use.     Past Medical History:  Diagnosis Date  . Depression   . Diabetes mellitus without complication ([place])   . Hyperlipidemia   . Hypertension   . Stroke [facility])     Past Surgical History:  Procedure Laterality Date  . COLONOSCOPY N/A 05/26/2015   Procedure: COLONOSCOPY;  Surgeon: Danie Binder, MD;  [facility];  Service: Endoscopy;  Laterality: N/A;  2:00  . CORONARY ARTERY BYPASS GRAFT N/A 12/10/2016   Procedure: CORONARY ARTERY BYPASS GRAFTING  (CABG) times three using the left internal mammary artery and the right greater saphenous vein;  Surgeon: Gaye Pollack, MD;  Location: [place];  Service: Open Heart Surgery;  Laterality: N/A;  . LEFT HEART CATH AND CORONARY ANGIOGRAPHY N/A 12/10/2016   Procedure: Left Heart Cath and Coronary Angiography;  Surgeon: Troy Sine, MD;  Location: [place] CV LAB;  Service: Cardiovascular;  Laterality: N/A;  . TEE WITHOUT CARDIOVERSION N/A 12/10/2016   Procedure: TRANSESOPHAGEAL ECHOCARDIOGRAM (TEE);  Surgeon: Gaye Pollack, MD;  Location: [place];  Service: Open Heart Surgery;  Laterality: N/A;  . TUBAL LIGATION      Family History  Problem Relation Age of Onset  . Diabetes Father   . Heart disease Mother   . Hyperlipidemia Sister   . Hypertension Sister   . Mental illness Brother     Social History   Socioeconomic History  . Marital status: Legally Separated    Spouse name: Not on file  . Number of children: Not on file  . Years of education: Not on file  . Highest education level:  Not on file  Occupational History  . Not on file  Tobacco Use  . Smoking status: Former Smoker    Packs/day: 1.00    Years: 30.00    Pack years: 30.00    Types: Cigarettes    Quit date: 12/10/2016    Years since quitting: 3.1  . Smokeless tobacco: Never Used  Substance and Sexual Activity  . Alcohol use: Not Currently    Comment: rarely  . Drug use: No  . Sexual activity: Not Currently    Birth control/protection: Surgical  Other Topics Concern  . Not on file  Social History Narrative  . Not on file   Social Determinants of Health   Financial Resource Strain:   . Difficulty of Paying Living Expenses:   Food Insecurity:   . Worried About Charity fundraiser in the Last Year:   . Arboriculturist in the Last Year:   Transportation Needs:   . Film/video editor (Medical):   Marland Kitchen Lack of Transportation (Non-Medical):   Physical Activity:   . Days of Exercise per Week:   . Minutes of  Exercise per Session:   Stress:   . Feeling of Stress :   Social Connections:   . Frequency of Communication with Friends and Family:   . Frequency of Social Gatherings with Friends and Family:   . Attends Religious Services:   . Active Member of Clubs or Organizations:   . Attends Archivist Meetings:   Marland Kitchen Marital Status:   Intimate Partner Violence:   . Fear of Current or Ex-Partner:   . Emotionally Abused:   Marland Kitchen Physically Abused:   . Sexually Abused:     Objective:   Today's Vitals: BP (!) 159/85   Pulse 94   Temp 98 F (36.7 C) (Temporal)   Ht '5\' 5"'  (1.651 m)   Wt 195 lb 3.2 oz (88.5 kg)   LMP 08/03/2013   SpO2 96%   BMI 32.48 kg/m   Physical Exam Vitals reviewed.  Constitutional:      General: She is not in acute distress.    Appearance: Normal appearance. She is obese. She is not ill-appearing, toxic-appearing or diaphoretic.  HENT:     Head: Normocephalic and atraumatic.  Eyes:     General: No scleral icterus.       Right eye: No discharge.        Left eye: No discharge.     Conjunctiva/sclera: Conjunctivae normal.  Cardiovascular:     Rate and Rhythm: Normal rate and regular rhythm.     Heart sounds: Normal heart sounds. No murmur. No friction rub. No gallop.   Pulmonary:     Effort: Pulmonary effort is normal. No respiratory distress.     Breath sounds: Normal breath sounds. No stridor. No wheezing, rhonchi or rales.  Musculoskeletal:        General: Normal range of motion.     Cervical back: Normal range of motion.  Skin:    General: Skin is warm and dry.     Capillary Refill: Capillary refill takes less than 2 seconds.  Neurological:     General: No focal deficit present.     Mental Status: She is alert and oriented to person, place, and time. Mental status is at baseline.  Psychiatric:        Mood and Affect: Mood normal.        Behavior: Behavior normal.        Thought Content: Thought  content normal.        Judgment: Judgment normal.     Diabetic Foot Exam - Simple   Simple Foot Form Diabetic Foot exam was performed with the following findings: Yes 01/20/2020  3:25 PM  Visual Inspection No deformities, no ulcerations, no other skin breakdown bilaterally: Yes Sensation Testing Intact to touch and monofilament testing bilaterally: Yes Pulse Check Posterior Tibialis and Dorsalis pulse intact bilaterally: Yes Comments Does have long thick toenails bilaterally.

## 2020-01-21 ENCOUNTER — Telehealth: Payer: Self-pay | Admitting: Family Medicine

## 2020-01-21 LAB — CMP14+EGFR
ALT: 15 IU/L (ref 0–32)
AST: 16 IU/L (ref 0–40)
Albumin/Globulin Ratio: 1.5 (ref 1.2–2.2)
Albumin: 4.3 g/dL (ref 3.8–4.9)
Alkaline Phosphatase: 126 IU/L — ABNORMAL HIGH (ref 39–117)
BUN/Creatinine Ratio: 10 (ref 9–23)
BUN: 6 mg/dL (ref 6–24)
Bilirubin Total: <0.2 mg/dL (ref 0.0–1.2)
CO2: 23 mmol/L (ref 20–29)
Calcium: 9.4 mg/dL (ref 8.7–10.2)
Chloride: 104 mmol/L (ref 96–106)
Creatinine, Ser: 0.61 mg/dL (ref 0.57–1.00)
GFR calc Af Amer: 119 mL/min/{1.73_m2} (ref >59)
GFR calc non Af Amer: 103 mL/min/{1.73_m2} (ref >59)
Globulin, Total: 2.9 g/dL (ref 1.5–4.5)
Glucose: 157 mg/dL — ABNORMAL HIGH (ref 65–99)
Potassium: 4.6 mmol/L (ref 3.5–5.2)
Sodium: 141 mmol/L (ref 134–144)
Total Protein: 7.2 g/dL (ref 6.0–8.5)

## 2020-01-21 LAB — LIPID PANEL
Chol/HDL Ratio: 6.1 ratio — ABNORMAL HIGH (ref 0.0–4.4)
Cholesterol, Total: 207 mg/dL — ABNORMAL HIGH (ref 100–199)
HDL: 34 mg/dL — ABNORMAL LOW (ref >39)
LDL Chol Calc (NIH): 123 mg/dL — ABNORMAL HIGH (ref 0–99)
Triglycerides: 284 mg/dL — ABNORMAL HIGH (ref 0–149)
VLDL Cholesterol Cal: 50 mg/dL — ABNORMAL HIGH (ref 5–40)

## 2020-01-21 LAB — MICROALBUMIN / CREATININE URINE RATIO
Creatinine, Urine: 309.3 mg/dL
Microalb/Creat Ratio: 56 mg/g creat — ABNORMAL HIGH (ref 0–29)
Microalbumin, Urine: 174.3 ug/mL

## 2020-01-21 LAB — HIV ANTIBODY (ROUTINE TESTING W REFLEX): HIV Screen 4th Generation wRfx: NONREACTIVE

## 2020-01-23 ENCOUNTER — Other Ambulatory Visit: Payer: Self-pay | Admitting: Family Medicine

## 2020-01-23 ENCOUNTER — Encounter: Payer: Self-pay | Admitting: Family Medicine

## 2020-01-23 DIAGNOSIS — E785 Hyperlipidemia, unspecified: Secondary | ICD-10-CM

## 2020-01-23 DIAGNOSIS — E1165 Type 2 diabetes mellitus with hyperglycemia: Secondary | ICD-10-CM

## 2020-01-23 MED ORDER — METFORMIN HCL 500 MG PO TABS: 500.0000 mg | ORAL_TABLET | Freq: Two times a day (BID) | ORAL | 0 refills | Status: DC

## 2020-01-23 MED ORDER — ATORVASTATIN CALCIUM 80 MG PO TABS: 80.0000 mg | ORAL_TABLET | Freq: Every day | ORAL | 2 refills | Status: DC

## 2020-01-24 ENCOUNTER — Other Ambulatory Visit: Payer: Self-pay

## 2020-01-24 ENCOUNTER — Telehealth: Payer: Self-pay | Admitting: Family Medicine

## 2020-01-24 MED ORDER — METFORMIN HCL 500 MG PO TABS: ORAL_TABLET | ORAL | 0 refills | Status: DC

## 2020-01-24 MED ORDER — METFORMIN HCL 1000 MG PO TABS: 1000.0000 mg | ORAL_TABLET | Freq: Two times a day (BID) | ORAL | 1 refills | Status: DC

## 2020-01-24 NOTE — Telephone Encounter (Signed)
Pt informed of all labs. She knows to increase Lipitor to 80mg  qd. Script sent on 3/21.   The metformin has been increased to 2000mg  qd gradually. Pt has some 500mg  at home but will need refills. Sent in 30 day supply for the gradual increase and a new script of 2000mg  daily with just 2 refills.

## 2020-02-15 ENCOUNTER — Ambulatory Visit (INDEPENDENT_AMBULATORY_CARE_PROVIDER_SITE_OTHER): Payer: Medicaid Other | Admitting: Family Medicine

## 2020-02-15 DIAGNOSIS — J3489 Other specified disorders of nose and nasal sinuses: Secondary | ICD-10-CM | POA: Diagnosis not present

## 2020-02-15 DIAGNOSIS — R0981 Nasal congestion: Secondary | ICD-10-CM

## 2020-02-15 MED ORDER — FLUTICASONE PROPIONATE 50 MCG/ACT NA SUSP: 2.0000 | Freq: Every day | NASAL | 6 refills | Status: DC

## 2020-02-15 MED ORDER — MUCINEX 600 MG PO TB12: 600.0000 mg | ORAL_TABLET | Freq: Two times a day (BID) | ORAL | 0 refills | Status: AC

## 2020-02-15 MED ORDER — DOXYCYCLINE HYCLATE 100 MG PO TABS: 100.0000 mg | ORAL_TABLET | Freq: Two times a day (BID) | ORAL | 0 refills | Status: AC

## 2020-02-16 ENCOUNTER — Encounter: Payer: Self-pay | Admitting: Family Medicine

## 2020-02-16 NOTE — Progress Notes (Signed)
Virtual Visit via telephone Note Due to COVID-19 pandemic this visit was conducted virtually. This visit type was conducted due to national recommendations for restrictions regarding the COVID-19 Pandemic (e.g. social distancing, sheltering in place) in an effort to limit this patient's exposure and mitigate transmission in our community. All issues noted in this document were discussed and addressed.  A physical exam was not performed with this format.   I connected with Cindy Garrett on 02/15/2020 at 1300 by telephone and verified that I am speaking with the correct person using two identifiers. Cindy Garrett is currently located at home and family is currently with them during visit. The provider, Kari Baars, FNP is located in their office at time of visit.  I discussed the limitations, risks, security and privacy concerns of performing an evaluation and management service by telephone and the availability of in person appointments. I also discussed with the patient that there may be a patient responsible charge related to this service. The patient expressed understanding and agreed to proceed.  Subjective:  Patient ID: Cindy Garrett, female    DOB: 08-30-1966, 54 y.o.   MRN: [id number]  Chief Complaint:  Sinus Problem   HPI: Cindy Garrett is a 54 y.o. female presenting on 02/15/2020 for Sinus Problem   Sinus Problem This is a new problem. The current episode started 1 to 4 weeks ago. The problem has been gradually worsening since onset. There has been no fever. Her pain is at a severity of 5/10. The pain is moderate. Associated symptoms include chills, congestion, coughing, headaches and sinus pressure. Pertinent negatives include no diaphoresis, ear pain, hoarse voice, neck pain, shortness of breath, sneezing, sore throat or swollen glands. Past treatments include oral decongestants, saline sprays and spray decongestants. The treatment provided no relief.     Relevant past medical,  surgical, family, and social history reviewed and updated as indicated.  Allergies and medications reviewed and updated.   Past Medical History:  Diagnosis Date  . Depression   . Diabetes mellitus without complication ([facility])   . Hyperlipidemia   . Hypertension   . Stroke [facility])     Past Surgical History:  Procedure Laterality Date  . COLONOSCOPY N/A 05/26/2015   Procedure: COLONOSCOPY;  Surgeon: West Bali, MD;  [facility];  Service: Endoscopy;  Laterality: N/A;  2:00  . CORONARY ARTERY BYPASS GRAFT N/A 12/10/2016   Procedure: CORONARY ARTERY BYPASS GRAFTING (CABG) times three using the left internal mammary artery and the right greater saphenous vein;  Surgeon: Alleen Borne, MD;  [facility] OR;  Service: Open Heart Surgery;  Laterality: N/A;  . LEFT HEART CATH AND CORONARY ANGIOGRAPHY N/A 12/10/2016   Procedure: Left Heart Cath and Coronary Angiography;  Surgeon: Lennette Bihari, MD;  [facility];  Service: Cardiovascular;  Laterality: N/A;  . TEE WITHOUT CARDIOVERSION N/A 12/10/2016   Procedure: TRANSESOPHAGEAL ECHOCARDIOGRAM (TEE);  Surgeon: Alleen Borne, MD;  Location: [facility] OR;  Service: Open Heart Surgery;  Laterality: N/A;  . TUBAL LIGATION      Social History   Socioeconomic History  . Marital status: Legally Separated    Spouse name: Not on file  . Number of children: Not on file  . Years of education: Not on file  . Highest education level: Not on file  Occupational History  . Not on file  Tobacco Use  . Smoking status: Former Smoker    Packs/day: 1.00    Years: 30.00  Pack years: 30.00    Types: Cigarettes    Quit date: 12/10/2016    Years since quitting: 3.1  . Smokeless tobacco: Never Used  Substance and Sexual Activity  . Alcohol use: Not Currently    Comment: rarely  . Drug use: No  . Sexual activity: Not Currently    Birth control/protection: Surgical  Other Topics Concern  . Not on file  Social History Narrative  . Not  on file   Social Determinants of Health   Financial Resource Strain:   . Difficulty of Paying Living Expenses:   Food Insecurity:   . Worried About Programme researcher, broadcasting/film/video in the Last Year:   . Barista in the Last Year:   Transportation Needs:   . Freight forwarder (Medical):   Marland Kitchen Lack of Transportation (Non-Medical):   Physical Activity:   . Days of Exercise per Week:   . Minutes of Exercise per Session:   Stress:   . Feeling of Stress :   Social Connections:   . Frequency of Communication with Friends and Family:   . Frequency of Social Gatherings with Friends and Family:   . Attends Religious Services:   . Active Member of Clubs or Organizations:   . Attends Banker Meetings:   Marland Kitchen Marital Status:   Intimate Partner Violence:   . Fear of Current or Ex-Partner:   . Emotionally Abused:   Marland Kitchen Physically Abused:   . Sexually Abused:     Outpatient Encounter Medications as of 02/15/2020  Medication Sig  . atorvastatin (LIPITOR) 80 MG tablet Take 1 tablet (80 mg total) by mouth daily at 6 PM.  . citalopram (CELEXA) 20 MG tablet Take 20 mg by mouth daily.  . clopidogrel (PLAVIX) 75 MG tablet Take 1 tablet (75 mg total) by mouth daily.  Marland Kitchen doxycycline (VIBRA-TABS) 100 MG tablet Take 1 tablet (100 mg total) by mouth 2 (two) times daily for 10 days. 1 po bid  . fluticasone (FLONASE) 50 MCG/ACT nasal spray Place 2 sprays into both nostrils daily.  Marland Kitchen guaiFENesin (MUCINEX) 600 MG 12 hr tablet Take 1 tablet (600 mg total) by mouth 2 (two) times daily for 14 days.  Marland Kitchen lisinopril (ZESTRIL) 10 MG tablet Take 1 tablet (10 mg total) by mouth daily.  . meloxicam (MOBIC) 7.5 MG tablet Take 1 tablet (7.5 mg total) by mouth daily.  . metFORMIN (GLUCOPHAGE) 1000 MG tablet Take 1 tablet (1,000 mg total) by mouth 2 (two) times daily with a meal.  . metFORMIN (GLUCOPHAGE) 500 MG tablet Take 1,500mg  daily this week. Then increase to 2,000mg  daily thereafter( 4 tabs daily).  .  metoprolol tartrate (LOPRESSOR) 25 MG tablet Take 1 tablet (25 mg total) by mouth daily.  . traZODone (DESYREL) 100 MG tablet TAKE 1 TABLET BY MOUTH ONCE DAILY AFTER A MEAL   No facility-administered encounter medications on file as of 02/15/2020.    Allergies  Allergen Reactions  . Penicillins Itching and Rash    Has patient had a PCN reaction causing immediate rash, facial/tongue/throat swelling, SOB or lightheadedness with hypotension: Yes Has patient had a PCN reaction causing severe rash involving mucus membranes or skin necrosis: Yes Has patient had a PCN reaction that required hospitalization Yes Has patient had a PCN reaction occurring within the last 10 years: No If all of the above answers are "NO", then may proceed with Cephalosporin use.     Review of Systems  Constitutional: Positive for chills. Negative  for activity change, appetite change, diaphoresis, fatigue, fever and unexpected weight change.  HENT: Positive for congestion, postnasal drip, rhinorrhea, sinus pressure and sinus pain. Negative for ear pain, hoarse voice, sneezing, sore throat, tinnitus, trouble swallowing and voice change.   Eyes: Negative.  Negative for photophobia and visual disturbance.  Respiratory: Positive for cough. Negative for chest tightness, shortness of breath and wheezing.   Cardiovascular: Negative for chest pain, palpitations and leg swelling.  Gastrointestinal: Negative for abdominal pain, blood in stool, constipation, diarrhea, nausea and vomiting.  Endocrine: Negative.   Genitourinary: Negative for decreased urine volume, difficulty urinating, dysuria, frequency and urgency.  Musculoskeletal: Negative for arthralgias, myalgias and neck pain.  Skin: Negative.   Allergic/Immunologic: Negative.   Neurological: Positive for headaches. Negative for dizziness, tremors, seizures, syncope, facial asymmetry, speech difficulty, weakness, light-headedness and numbness.  Hematological: Negative.     Psychiatric/Behavioral: Negative for confusion, hallucinations, sleep disturbance and suicidal ideas.  All other systems reviewed and are negative.        Observations/Objective: No vital signs or physical exam, this was a telephone or virtual health encounter.  Pt alert and oriented, answers all questions appropriately, and able to speak in full sentences.    Assessment and Plan: Cindy Garrett was seen today for sinus problem.  Diagnoses and all orders for this visit:  Sinus congestion Sinus pressure Reported symptoms consistent with sinusitis. Pt has tried and failed symptomatic care at home. Will treat with below. Symptomatic care discussed in detail. Pt aware to report any new, worsening, or persistent symptoms.  -     doxycycline (VIBRA-TABS) 100 MG tablet; Take 1 tablet (100 mg total) by mouth 2 (two) times daily for 10 days. 1 po bid -     fluticasone (FLONASE) 50 MCG/ACT nasal spray; Place 2 sprays into both nostrils daily. -     guaiFENesin (MUCINEX) 600 MG 12 hr tablet; Take 1 tablet (600 mg total) by mouth 2 (two) times daily for 14 days.     Follow Up Instructions: Return if symptoms worsen or fail to improve.    I discussed the assessment and treatment plan with the patient. The patient was provided an opportunity to ask questions and all were answered. The patient agreed with the plan and demonstrated an understanding of the instructions.   The patient was advised to call back or seek an in-person evaluation if the symptoms worsen or if the condition fails to improve as anticipated.  The above assessment and management plan was discussed with the patient. The patient verbalized understanding of and has agreed to the management plan. Patient is aware to call the clinic if they develop any new symptoms or if symptoms persist or worsen. Patient is aware when to return to the clinic for a follow-up visit. Patient educated on when it is appropriate to go to the emergency  department.    I provided 15 minutes of non-face-to-face time during this encounter. The call started at 1300. The call ended at 1315. The other time was used for coordination of care.    Monia Pouch, FNP-C [facility] [address] [place], [place] [address] [phone] 02/15/2020

## 2020-04-04 ENCOUNTER — Ambulatory Visit: Payer: Medicaid Other | Admitting: Podiatry

## 2020-04-21 ENCOUNTER — Encounter: Payer: Self-pay | Admitting: Family Medicine

## 2020-04-21 ENCOUNTER — Other Ambulatory Visit: Payer: Self-pay

## 2020-04-21 ENCOUNTER — Ambulatory Visit (INDEPENDENT_AMBULATORY_CARE_PROVIDER_SITE_OTHER): Payer: Medicaid Other | Admitting: Family Medicine

## 2020-04-21 DIAGNOSIS — Z1159 Encounter for screening for other viral diseases: Secondary | ICD-10-CM | POA: Diagnosis not present

## 2020-04-21 DIAGNOSIS — E785 Hyperlipidemia, unspecified: Secondary | ICD-10-CM | POA: Diagnosis not present

## 2020-04-21 DIAGNOSIS — E1165 Type 2 diabetes mellitus with hyperglycemia: Secondary | ICD-10-CM | POA: Diagnosis not present

## 2020-04-21 NOTE — Progress Notes (Signed)
Virtual Visit via Telephone Note  I connected with Cindy Garrett on 04/21/20 at 8:28 AM by telephone and verified that I am speaking with the correct person using two identifiers. Cindy Garrett is currently located in her car and nobody is currently with her during this visit. The provider, Loman Brooklyn, FNP is located in their office at time of visit.  I discussed the limitations, risks, security and privacy concerns of performing an evaluation and management service by telephone and the availability of in person appointments. I also discussed with the patient that there may be a patient responsible charge related to this service. The patient expressed understanding and agreed to proceed.  Subjective: PCP: Loman Brooklyn, FNP  Chief Complaint  Patient presents with  . Diabetes   Diabetes: Patient presents for follow up of diabetes. Current symptoms include: none. Known diabetic complications: cerebrovascular disease. Medication compliance: yes. Current diet: in general, an "unhealthy" diet. Current exercise: walking. Home blood sugar records: patient does not check sugars. Is she  on ACE inhibitor or angiotensin II receptor blocker? Yes. Is she on a statin? Yes.   Lab Results  Component Value Date   HGBA1C 8.0 (H) 01/20/2020   Lab Results  Component Value Date   LDLCALC 123 (H) 01/20/2020   CREATININE 0.61 01/20/2020     ROS: Per HPI  Current Outpatient Medications:  .  atorvastatin (LIPITOR) 80 MG tablet, Take 1 tablet (80 mg total) by mouth daily at 6 PM., Disp: 30 tablet, Rfl: 2 .  citalopram (CELEXA) 20 MG tablet, Take 20 mg by mouth daily., Disp: , Rfl: 2 .  clopidogrel (PLAVIX) 75 MG tablet, Take 1 tablet (75 mg total) by mouth daily., Disp: 90 tablet, Rfl: 1 .  fluticasone (FLONASE) 50 MCG/ACT nasal spray, Place 2 sprays into both nostrils daily., Disp: 16 g, Rfl: 6 .  lisinopril (ZESTRIL) 10 MG tablet, Take 1 tablet (10 mg total) by mouth daily., Disp: 90 tablet,  Rfl: 1 .  meloxicam (MOBIC) 7.5 MG tablet, Take 1 tablet (7.5 mg total) by mouth daily., Disp: 90 tablet, Rfl: 2 .  metFORMIN (GLUCOPHAGE) 1000 MG tablet, Take 1 tablet (1,000 mg total) by mouth 2 (two) times daily with a meal., Disp: 180 tablet, Rfl: 1 .  metoprolol tartrate (LOPRESSOR) 25 MG tablet, Take 1 tablet (25 mg total) by mouth daily., Disp: 90 tablet, Rfl: 1 .  traZODone (DESYREL) 100 MG tablet, TAKE 1 TABLET BY MOUTH ONCE DAILY AFTER A MEAL, Disp: , Rfl: 2  Allergies  Allergen Reactions  . Penicillins Itching and Rash    Has patient had a PCN reaction causing immediate rash, facial/tongue/throat swelling, SOB or lightheadedness with hypotension: Yes Has patient had a PCN reaction causing severe rash involving mucus membranes or skin necrosis: Yes Has patient had a PCN reaction that required hospitalization Yes Has patient had a PCN reaction occurring within the last 10 years: No If all of the above answers are "NO", then may proceed with Cephalosporin use.    Past Medical History:  Diagnosis Date  . Depression   . Diabetes mellitus without complication ([place])   . Hyperlipidemia   . Hypertension   . Stroke [facility])     Observations/Objective: A&O  No respiratory distress or wheezing audible over the phone Mood, judgement, and thought processes all WNL   Assessment and Plan: 1. Type 2 diabetes mellitus with hyperglycemia, without long-term current use of insulin (HCC) - Unable to obtain A1c today as patient was  a telephone visit due to cough.  - Medications: continue current medications until she is able to come in and have repeat A1c for possible adjustment.  - Home glucose monitoring: not performing - Patient is currently taking a statin. Patient is taking an ACE-inhibitor/ARB.  - Last foot exam: 01/20/2020 - Last diabetic eye exam: recent - record requested - Urine Microalbumin/Creat Ratio: 01/20/2020 - CBC with Differential/Platelet; Future - CMP14+EGFR; Future -  Lipid panel; Future - Bayer DCA Hb A1c Waived; Future  2. Dyslipidemia - Atorvastatin increased with labs after last visit.  - CMP14+EGFR; Future - Lipid panel; Future  3. Encounter for hepatitis C screening test for low risk patient - Hepatitis C antibody; Future   Follow Up Instructions: Return as directed after labs result.  I discussed the assessment and treatment plan with the patient. The patient was provided an opportunity to ask questions and all were answered. The patient agreed with the plan and demonstrated an understanding of the instructions.   The patient was advised to call back or seek an in-person evaluation if the symptoms worsen or if the condition fails to improve as anticipated.  The above assessment and management plan was discussed with the patient. The patient verbalized understanding of and has agreed to the management plan. Patient is aware to call the clinic if symptoms persist or worsen. Patient is aware when to return to the clinic for a follow-up visit. Patient educated on when it is appropriate to go to the emergency department.   Time call ended: 8:40 AM  I provided 14 minutes of non-face-to-face time during this encounter.  Hendricks Limes, MSN, APRN, FNP-C [facility] 04/21/20

## 2020-05-03 ENCOUNTER — Other Ambulatory Visit: Payer: Self-pay | Admitting: Family Medicine

## 2020-05-03 DIAGNOSIS — Z1231 Encounter for screening mammogram for malignant neoplasm of breast: Secondary | ICD-10-CM

## 2020-05-31 ENCOUNTER — Other Ambulatory Visit: Payer: Self-pay

## 2020-05-31 ENCOUNTER — Ambulatory Visit
Admission: RE | Admit: 2020-05-31 | Discharge: 2020-05-31 | Disposition: A | Discharge status: Home / Self Care | Payer: Medicaid Other | Source: Ambulatory Visit | Attending: Family Medicine | Admitting: Family Medicine

## 2020-05-31 DIAGNOSIS — Z1231 Encounter for screening mammogram for malignant neoplasm of breast: Secondary | ICD-10-CM

## 2021-01-04 ENCOUNTER — Telehealth: Payer: Self-pay

## 2021-01-04 NOTE — Telephone Encounter (Signed)
FYI: Patient has been called multiple times to have a diabetic check up appt scheduled because patient is overdue per provider.  Unable to reach patient. Messages have been left on patients voicemail.

## 2021-01-08 ENCOUNTER — Ambulatory Visit: Payer: Medicaid Other | Admitting: Family Medicine

## 2021-01-08 ENCOUNTER — Encounter: Payer: Self-pay | Admitting: Family Medicine

## 2021-01-08 ENCOUNTER — Other Ambulatory Visit: Payer: Self-pay

## 2021-01-08 VITALS — BP 158/90 | HR 74 | Temp 97.7°F | Ht 65.0 in | Wt 181.2 lb

## 2021-01-08 DIAGNOSIS — I25118 Atherosclerotic heart disease of native coronary artery with other forms of angina pectoris: Secondary | ICD-10-CM

## 2021-01-08 DIAGNOSIS — E1165 Type 2 diabetes mellitus with hyperglycemia: Secondary | ICD-10-CM | POA: Diagnosis not present

## 2021-01-08 DIAGNOSIS — I10 Essential (primary) hypertension: Secondary | ICD-10-CM

## 2021-01-08 DIAGNOSIS — Z8673 Personal history of transient ischemic attack (TIA), and cerebral infarction without residual deficits: Secondary | ICD-10-CM

## 2021-01-08 DIAGNOSIS — E785 Hyperlipidemia, unspecified: Secondary | ICD-10-CM | POA: Diagnosis not present

## 2021-01-08 DIAGNOSIS — I2510 Atherosclerotic heart disease of native coronary artery without angina pectoris: Secondary | ICD-10-CM | POA: Insufficient documentation

## 2021-01-08 LAB — BAYER DCA HB A1C WAIVED: HB A1C (BAYER DCA - WAIVED): 7.4 % — ABNORMAL HIGH (ref <7.0)

## 2021-01-08 MED ORDER — METFORMIN HCL ER 500 MG PO TB24
500.0000 mg | ORAL_TABLET | Freq: Two times a day (BID) | ORAL | 2 refills | Status: DC
Start: 2021-01-08 — End: 2021-04-24

## 2021-01-08 MED ORDER — ATORVASTATIN CALCIUM 80 MG PO TABS: 80.0000 mg | ORAL_TABLET | Freq: Every day | ORAL | 1 refills | Status: DC

## 2021-01-08 MED ORDER — CLOPIDOGREL BISULFATE 75 MG PO TABS
75.0000 mg | ORAL_TABLET | Freq: Every day | ORAL | 1 refills | Status: DC
Start: 2021-01-08 — End: 2021-07-26

## 2021-01-08 MED ORDER — METOPROLOL TARTRATE 25 MG PO TABS: 25.0000 mg | ORAL_TABLET | Freq: Every day | ORAL | 1 refills | Status: DC

## 2021-01-08 MED ORDER — LISINOPRIL 10 MG PO TABS: 10.0000 mg | ORAL_TABLET | Freq: Every day | ORAL | 1 refills | Status: DC

## 2021-01-08 NOTE — Progress Notes (Signed)
Assessment & Plan:  1. Type 2 diabetes mellitus with hyperglycemia, without long-term current use of insulin (HCC) A1c today 7.4 which is improved from 8.0 - Diabetes is not at goal of A1c < 7. - Medications: started patient on Metformin 500 mg twice daily with instructions to take once daily for the first week - Patient is currently taking a statin. Patient is taking an ACE-inhibitor/ARB.  Both of these medications were refilled today as patient had quit taking all of her medications. - Instruction/counseling given: discussed diet and provided printed educational material  Diabetes Health Maintenance Due  Topic Date Due  . OPHTHALMOLOGY EXAM  Never done  . HEMOGLOBIN A1C  07/22/2020  . FOOT EXAM  01/19/2021    Lab Results  Component Value Date   LABMICR 174.3 01/20/2020   - Lipid panel - CBC with Differential/Platelet - CMP14+EGFR - Bayer DCA Hb A1c Waived - atorvastatin (LIPITOR) 80 MG tablet; Take 1 tablet (80 mg total) by mouth daily at 6 PM.  Dispense: 90 tablet; Refill: 1 - lisinopril (ZESTRIL) 10 MG tablet; Take 1 tablet (10 mg total) by mouth daily.  Dispense: 90 tablet; Refill: 1 - metFORMIN (GLUCOPHAGE-XR) 500 MG 24 hr tablet; Take 1 tablet (500 mg total) by mouth 2 (two) times daily with a meal.  Dispense: 60 tablet; Refill: 2  2. Essential (primary) hypertension Uncontrolled.  Patient has been off of her medication.  Refilled today. - Lipid panel - CBC with Differential/Platelet - CMP14+EGFR - metoprolol tartrate (LOPRESSOR) 25 MG tablet; Take 1 tablet (25 mg total) by mouth daily.  Dispense: 90 tablet; Refill: 1 - lisinopril (ZESTRIL) 10 MG tablet; Take 1 tablet (10 mg total) by mouth daily.  Dispense: 90 tablet; Refill: 1  3. Dyslipidemia Labs to assess.  Patient has been off of her medication.  Refilled today. - Lipid panel - CMP14+EGFR - atorvastatin (LIPITOR) 80 MG tablet; Take 1 tablet (80 mg total) by mouth daily at 6 PM.  Dispense: 90 tablet; Refill:  1  4. History of CVA (cerebrovascular accident) Started back on Plavix today as she has not been taking. - CBC with Differential/Platelet - clopidogrel (PLAVIX) 75 MG tablet; Take 1 tablet (75 mg total) by mouth daily.  Dispense: 90 tablet; Refill: 1  5. 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 - Lipid panel - CMP14+EGFR - atorvastatin (LIPITOR) 80 MG tablet; Take 1 tablet (80 mg total) by mouth daily at 6 PM.  Dispense: 90 tablet; Refill: 1   Return in about 3 months (around 04/10/2021) for DM, HTN.  Cindy Limes, MSN, APRN, FNP-C [facility]  Subjective:    Patient ID: Cindy Garrett, female    DOB: [birth year], 55 y.o.   MRN: [id number]  Patient Care Team: Loman Brooklyn, FNP as PCP - General (Family Medicine) Harl Bowie Alphonse Guild, MD as PCP - Cardiology (Cardiology)   Chief Complaint:  Chief Complaint  Patient presents with  . Diabetes    Check up of chronic medical conditions    HPI: Cindy Garrett is a 55 y.o. female presenting on 01/08/2021 for Diabetes (Check up of chronic medical conditions)  Diabetes: Patient presents for follow up of diabetes. Current symptoms include: none. Known diabetic complications: cerebrovascular disease. Medication compliance: no. Current diet: eating better; using an air fryer. Current exercise: walking. Home blood sugar records: patient does not check sugars.  Is she  on ACE inhibitor or angiotensin II receptor blocker? Yes. Is she on a statin? Yes. She has not been taking any of her medications for "a long time".   New complaints: None  Social history:  Relevant past medical, surgical, family and social history reviewed and updated as indicated. Interim medical history since our last visit reviewed.  Allergies and medications reviewed and updated.  DATA REVIEWED: CHART IN  EPIC  ROS: Negative unless specifically indicated above in HPI.    Current Outpatient Medications:  .  atorvastatin (LIPITOR) 80 MG tablet, Take 1 tablet (80 mg total) by mouth daily at 6 PM., Disp: 30 tablet, Rfl: 2 .  citalopram (CELEXA) 20 MG tablet, Take 20 mg by mouth daily., Disp: , Rfl: 2 .  clopidogrel (PLAVIX) 75 MG tablet, Take 1 tablet (75 mg total) by mouth daily., Disp: 90 tablet, Rfl: 1 .  fluticasone (FLONASE) 50 MCG/ACT nasal spray, Place 2 sprays into both nostrils daily., Disp: 16 g, Rfl: 6 .  lisinopril (ZESTRIL) 10 MG tablet, Take 1 tablet (10 mg total) by mouth daily., Disp: 90 tablet, Rfl: 1 .  meloxicam (MOBIC) 7.5 MG tablet, Take 1 tablet (7.5 mg total) by mouth daily., Disp: 90 tablet, Rfl: 2 .  metFORMIN (GLUCOPHAGE) 1000 MG tablet, Take 1 tablet (1,000 mg total) by mouth 2 (two) times daily with a meal., Disp: 180 tablet, Rfl: 1 .  metoprolol tartrate (LOPRESSOR) 25 MG tablet, Take 1 tablet (25 mg total) by mouth daily., Disp: 90 tablet, Rfl: 1   Allergies  Allergen Reactions  . Penicillins Itching and Rash    Has patient had a PCN reaction causing immediate rash, facial/tongue/throat swelling, SOB or lightheadedness with hypotension: Yes Has patient had a PCN reaction causing severe rash involving mucus membranes or skin necrosis: Yes Has patient had a PCN reaction that required hospitalization Yes Has patient had a PCN reaction occurring within the last 10 years: No If all of the above answers are "NO", then may proceed with Cephalosporin use.    Past Medical History:  Diagnosis Date  . Depression   . Diabetes mellitus without complication ([place])   . Hyperlipidemia   . Hypertension   . Stroke [facility])     Past Surgical History:  Procedure Laterality Date  . COLONOSCOPY N/A 05/26/2015   Procedure: COLONOSCOPY;  Surgeon: Danie Binder, MD;  [facility];  Service: Endoscopy;  Laterality: N/A;  2:00  . CORONARY ARTERY BYPASS GRAFT N/A 12/10/2016    Procedure: CORONARY ARTERY BYPASS GRAFTING (CABG) times three using the left internal mammary artery and the right greater saphenous vein;  Surgeon: Gaye Pollack, MD;  Location: [place];  Service: Open Heart Surgery;  Laterality: N/A;  . LEFT HEART CATH AND CORONARY ANGIOGRAPHY N/A 12/10/2016   Procedure: Left Heart Cath and Coronary Angiography;  Surgeon: Troy Sine, MD;  Location: [place] [facility] LAB;  Service: Cardiovascular;  Laterality: N/A;  . TEE WITHOUT CARDIOVERSION N/A 12/10/2016   Procedure: TRANSESOPHAGEAL ECHOCARDIOGRAM (TEE);  Surgeon: Gaye Pollack, MD;  Location: [place];  Service: Open Heart Surgery;  Laterality: N/A;  . TUBAL LIGATION      Social History   Socioeconomic History  . Marital status: Legally Separated    Spouse name: Not on file  . Number of children: Not on file  . Years of education: Not on file  . Highest education level: Not on file  Occupational History  . Not on file  Tobacco Use  . Smoking status: Former Smoker    Packs/day: 1.00    Years: 30.00    Pack years: 30.00    Types: Cigarettes    Quit date: 12/10/2016    Years since quitting: 4.0  . Smokeless tobacco: Never Used  Vaping Use  . Vaping Use: Never used  Substance and Sexual Activity  . Alcohol use: Not Currently    Comment: rarely  . Drug use: No  . Sexual activity: Not Currently    Birth control/protection: Surgical  Other Topics Concern  . Not on file  Social History Narrative  . Not on file   Social Determinants of Health   Financial Resource Strain: Not on file  Food Insecurity: Not on file  Transportation Needs: Not on file  Physical Activity: Not on file  Stress: Not on file  Social Connections: Not on file  Intimate Partner Violence: Not on file        Objective:    BP (!) 158/90   Pulse 74   Temp 97.7 F (36.5 C) (Temporal)   Ht _0  (1.651 m)   Wt 181 lb 3.2 oz (82.2 kg)   LMP 08/03/2013   SpO2 99%   BMI 30.15 kg/m   Wt Readings from Last 3 Encounters:   01/08/21 181 lb 3.2 oz (82.2 kg)  01/20/20 195 lb 3.2 oz (88.5 kg)  09/16/18 203 lb (92.1 kg)    Physical Exam Vitals reviewed.  Constitutional:      General: She is not in acute distress.    Appearance: Normal appearance. She is obese. She is not ill-appearing, toxic-appearing or diaphoretic.  HENT:     Head: Normocephalic and atraumatic.  Eyes:     General: No scleral icterus.       Right eye: No discharge.        Left eye: No discharge.     Conjunctiva/sclera: Conjunctivae normal.  Cardiovascular:     Rate and Rhythm: Normal rate and regular rhythm.     Heart sounds: Normal heart sounds. No murmur heard. No friction rub. No gallop.   Pulmonary:     Effort: Pulmonary effort is normal. No respiratory distress.     Breath sounds: Normal breath sounds. No stridor. No wheezing, rhonchi or rales.  Musculoskeletal:        General: Normal range of motion.     Cervical back: Normal range of motion.  Skin:    General: Skin is warm and dry.     Capillary Refill: Capillary refill takes less than 2 seconds.  Neurological:     General: No focal deficit present.     Mental Status: She is alert and oriented to person, place, and time. Mental status is at baseline.  Psychiatric:        Mood and Affect: Mood normal.        Behavior: Behavior normal.        Thought Content: Thought content normal.        Judgment: Judgment normal.     No results found for: TSH Lab Results  Component Value Date   WBC 11.5 (H) 12/12/2016   HGB 11.0 (L) 12/12/2016   HCT 32.5 (L) 12/12/2016   MCV 86.7 12/12/2016   PLT 250 12/12/2016   Lab Results  Component Value Date   NA 141 01/20/2020   K 4.6 01/20/2020   CO2 23 01/20/2020   GLUCOSE 157 (H) 01/20/2020   BUN 6 01/20/2020   CREATININE 0.61 01/20/2020  BILITOT <0.2 01/20/2020   ALKPHOS 126 (H) 01/20/2020   AST 16 01/20/2020   ALT 15 01/20/2020   PROT 7.2 01/20/2020   ALBUMIN 4.3 01/20/2020   CALCIUM 9.4 01/20/2020   ANIONGAP 10  12/13/2016   Lab Results  Component Value Date   CHOL 207 (H) 01/20/2020   Lab Results  Component Value Date   HDL 34 (L) 01/20/2020   Lab Results  Component Value Date   LDLCALC 123 (H) 01/20/2020   Lab Results  Component Value Date   TRIG 284 (H) 01/20/2020   Lab Results  Component Value Date   CHOLHDL 6.1 (H) 01/20/2020   Lab Results  Component Value Date   HGBA1C 8.0 (H) 01/20/2020

## 2021-01-08 NOTE — Patient Instructions (Signed)
Please call us back with your COVID vaccine dates.  Please schedule 2nd COVID vaccine.   Diabetes Mellitus and Nutrition, Adult When you have diabetes, or diabetes mellitus, it is very important to have healthy eating habits because your blood sugar (glucose) levels are greatly affected by what you eat and drink. Eating healthy foods in the right amounts, at about the same times every day, can help you:  Control your blood glucose.  Lower your risk of heart disease.  Improve your blood pressure.  Reach or maintain a healthy weight. What can affect my meal plan? Every person with diabetes is different, and each person has different needs for a meal plan. Your health care provider may recommend that you work with a dietitian to make a meal plan that is best for you. Your meal plan may vary depending on factors such as:  The calories you need.  The medicines you take.  Your weight.  Your blood glucose, blood pressure, and cholesterol levels.  Your activity level.  Other health conditions you have, such as heart or kidney disease. How do carbohydrates affect me? Carbohydrates, also called carbs, affect your blood glucose level more than any other type of food. Eating carbs naturally raises the amount of glucose in your blood. Carb counting is a method for keeping track of how many carbs you eat. Counting carbs is important to keep your blood glucose at a healthy level, especially if you use insulin or take certain oral diabetes medicines. It is important to know how many carbs you can safely have in each meal. This is different for every person. Your dietitian can help you calculate how many carbs you should have at each meal and for each snack. How does alcohol affect me? Alcohol can cause a sudden decrease in blood glucose (hypoglycemia), especially if you use insulin or take certain oral diabetes medicines. Hypoglycemia can be a life-threatening condition. Symptoms of hypoglycemia,  such as sleepiness, dizziness, and confusion, are similar to symptoms of having too much alcohol.  Do not drink alcohol if: ? Your health care provider tells you not to drink. ? You are pregnant, may be pregnant, or are planning to become pregnant.  If you drink alcohol: ? Do not drink on an empty stomach. ? Limit how much you use to:  0-1 drink a day for women.  0-2 drinks a day for men. ? Be aware of how much alcohol is in your drink. In the [place] one drink equals one 12 oz bottle of beer (355 mL), one 5 oz glass of wine (148 mL), or one 1 oz glass of hard liquor (44 mL). ? Keep yourself hydrated with water, diet soda, or unsweetened iced tea.  Keep in mind that regular soda, juice, and other mixers may contain a lot of sugar and must be counted as carbs. What are tips for following this plan? Reading food labels  Start by checking the serving size on the "Nutrition Facts" label of packaged foods and drinks. The amount of calories, carbs, fats, and other nutrients listed on the label is based on one serving of the item. Many items contain more than one serving per package.  Check the total grams (g) of carbs in one serving. You can calculate the number of servings of carbs in one serving by dividing the total carbs by 15. For example, if a food has 30 g of total carbs per serving, it would be equal to 2 servings of carbs.  Check the  number of grams (g) of saturated fats and trans fats in one serving. Choose foods that have a low amount or none of these fats.  Check the number of milligrams (mg) of salt (sodium) in one serving. Most people should limit total sodium intake to less than 2,300 mg per day.  Always check the nutrition information of foods labeled as "low-fat" or "nonfat." These foods may be higher in added sugar or refined carbs and should be avoided.  Talk to your dietitian to identify your daily goals for nutrients listed on the label. Shopping  Avoid buying canned,  pre-made, or processed foods. These foods tend to be high in fat, sodium, and added sugar.  Shop around the outside edge of the grocery store. This is where you will most often find fresh fruits and vegetables, bulk grains, fresh meats, and fresh dairy. Cooking  Use low-heat cooking methods, such as baking, instead of high-heat cooking methods like deep frying.  Cook using healthy oils, such as olive, canola, or sunflower oil.  Avoid cooking with butter, cream, or high-fat meats. Meal planning  Eat meals and snacks regularly, preferably at the same times every day. Avoid going long periods of time without eating.  Eat foods that are high in fiber, such as fresh fruits, vegetables, beans, and whole grains. Talk with your dietitian about how many servings of carbs you can eat at each meal.  Eat 4-6 oz (112-168 g) of lean protein each day, such as lean meat, chicken, fish, eggs, or tofu. One ounce (oz) of lean protein is equal to: ? 1 oz (28 g) of meat, chicken, or fish. ? 1 egg. ?  cup (62 g) of tofu.  Eat some foods each day that contain healthy fats, such as avocado, nuts, seeds, and fish.   What foods should I eat? Fruits Berries. Apples. Oranges. Peaches. Apricots. Plums. Grapes. Mango. Papaya. Pomegranate. Kiwi. Cherries. Vegetables Lettuce. Spinach. Leafy greens, including kale, chard, collard greens, and mustard greens. Beets. Cauliflower. Cabbage. Broccoli. Carrots. Green beans. Tomatoes. Peppers. Onions. Cucumbers. Brussels sprouts. Grains Whole grains, such as whole-wheat or whole-grain bread, crackers, tortillas, cereal, and pasta. Unsweetened oatmeal. Quinoa. Brown or wild rice. Meats and other proteins Seafood. Poultry without skin. Lean cuts of poultry and beef. Tofu. Nuts. Seeds. Dairy Low-fat or fat-free dairy products such as milk, yogurt, and cheese. The items listed above may not be a complete list of foods and beverages you can eat. Contact a dietitian for more  information. What foods should I avoid? Fruits Fruits canned with syrup. Vegetables Canned vegetables. Frozen vegetables with butter or cream sauce. Grains Refined white flour and flour products such as bread, pasta, snack foods, and cereals. Avoid all processed foods. Meats and other proteins Fatty cuts of meat. Poultry with skin. Breaded or fried meats. Processed meat. Avoid saturated fats. Dairy Full-fat yogurt, cheese, or milk. Beverages Sweetened drinks, such as soda or iced tea. The items listed above may not be a complete list of foods and beverages you should avoid. Contact a dietitian for more information. Questions to ask a health care provider  Do I need to meet with a diabetes educator?  Do I need to meet with a dietitian?  What number can I call if I have questions?  When are the best times to check my blood glucose? Where to find more information:  American Diabetes Association: diabetes.org  Academy of Nutrition and Dietetics: www.eatright.[employer] of Diabetes and Digestive and Kidney Diseases: CarFlippers.tn  Association of Diabetes Care and Education Specialists: www.diabeteseducator.org Summary  It is important to have healthy eating habits because your blood sugar (glucose) levels are greatly affected by what you eat and drink.  A healthy meal plan will help you control your blood glucose and maintain a healthy lifestyle.  Your health care provider may recommend that you work with a dietitian to make a meal plan that is best for you.  Keep in mind that carbohydrates (carbs) and alcohol have immediate effects on your blood glucose levels. It is important to count carbs and to use alcohol carefully. This information is not intended to replace advice given to you by your health care provider. Make sure you discuss any questions you have with your health care provider. Document Revised: 09/28/2019 Document Reviewed: 09/28/2019 Elsevier  Patient Education  2021 [employer].

## 2021-01-09 LAB — LIPID PANEL
Chol/HDL Ratio: 5.5 ratio — ABNORMAL HIGH (ref 0.0–4.4)
Cholesterol, Total: 210 mg/dL — ABNORMAL HIGH (ref 100–199)
HDL: 38 mg/dL — ABNORMAL LOW (ref >39)
LDL Chol Calc (NIH): 138 mg/dL — ABNORMAL HIGH (ref 0–99)
Triglycerides: 188 mg/dL — ABNORMAL HIGH (ref 0–149)
VLDL Cholesterol Cal: 34 mg/dL (ref 5–40)

## 2021-01-09 LAB — CMP14+EGFR
ALT: 12 IU/L (ref 0–32)
AST: 14 IU/L (ref 0–40)
Albumin/Globulin Ratio: 1.6 (ref 1.2–2.2)
Albumin: 4.5 g/dL (ref 3.8–4.9)
Alkaline Phosphatase: 120 IU/L (ref 44–121)
BUN/Creatinine Ratio: 19 (ref 9–23)
BUN: 15 mg/dL (ref 6–24)
Bilirubin Total: 0.3 mg/dL (ref 0.0–1.2)
CO2: 22 mmol/L (ref 20–29)
Calcium: 9 mg/dL (ref 8.7–10.2)
Chloride: 100 mmol/L (ref 96–106)
Creatinine, Ser: 0.77 mg/dL (ref 0.57–1.00)
Globulin, Total: 2.8 g/dL (ref 1.5–4.5)
Glucose: 156 mg/dL — ABNORMAL HIGH (ref 65–99)
Potassium: 4.4 mmol/L (ref 3.5–5.2)
Sodium: 138 mmol/L (ref 134–144)
Total Protein: 7.3 g/dL (ref 6.0–8.5)
eGFR: 91 mL/min/{1.73_m2} (ref >59)

## 2021-01-09 LAB — CBC WITH DIFFERENTIAL/PLATELET
Basophils Absolute: 0 10*3/uL (ref 0.0–0.2)
Basos: 1 %
EOS (ABSOLUTE): 0.2 10*3/uL (ref 0.0–0.4)
Eos: 3 %
Hematocrit: 41.6 % (ref 34.0–46.6)
Hemoglobin: 14.3 g/dL (ref 11.1–15.9)
Immature Grans (Abs): 0 10*3/uL (ref 0.0–0.1)
Immature Granulocytes: 0 %
Lymphocytes Absolute: 2.5 10*3/uL (ref 0.7–3.1)
Lymphs: 35 %
MCH: 29.6 pg (ref 26.6–33.0)
MCHC: 34.4 g/dL (ref 31.5–35.7)
MCV: 86 fL (ref 79–97)
Monocytes Absolute: 0.6 10*3/uL (ref 0.1–0.9)
Monocytes: 8 %
Neutrophils Absolute: 3.7 10*3/uL (ref 1.4–7.0)
Neutrophils: 53 %
Platelets: 428 10*3/uL (ref 150–450)
RBC: 4.83 x10E6/uL (ref 3.77–5.28)
RDW: 12.5 % (ref 11.7–15.4)
WBC: 7 10*3/uL (ref 3.4–10.8)

## 2021-01-25 ENCOUNTER — Encounter: Payer: Self-pay | Admitting: *Deleted

## 2021-02-05 ENCOUNTER — Other Ambulatory Visit: Payer: Self-pay

## 2021-02-05 ENCOUNTER — Encounter (HOSPITAL_COMMUNITY): Payer: Self-pay | Admitting: *Deleted

## 2021-02-05 ENCOUNTER — Emergency Department (HOSPITAL_COMMUNITY)
Admission: EM | Admit: 2021-02-05 | Discharge: 2021-02-05 | Disposition: A | Discharge status: Home / Self Care | Payer: Medicaid Other | Attending: Emergency Medicine | Admitting: Emergency Medicine

## 2021-02-05 ENCOUNTER — Emergency Department (HOSPITAL_COMMUNITY): Payer: Medicaid Other

## 2021-02-05 DIAGNOSIS — I2510 Atherosclerotic heart disease of native coronary artery without angina pectoris: Secondary | ICD-10-CM | POA: Insufficient documentation

## 2021-02-05 DIAGNOSIS — Z20822 Contact with and (suspected) exposure to covid-19: Secondary | ICD-10-CM | POA: Insufficient documentation

## 2021-02-05 DIAGNOSIS — R067 Sneezing: Secondary | ICD-10-CM | POA: Insufficient documentation

## 2021-02-05 DIAGNOSIS — J3489 Other specified disorders of nose and nasal sinuses: Secondary | ICD-10-CM | POA: Diagnosis not present

## 2021-02-05 DIAGNOSIS — Z7902 Long term (current) use of antithrombotics/antiplatelets: Secondary | ICD-10-CM | POA: Diagnosis not present

## 2021-02-05 DIAGNOSIS — Z7984 Long term (current) use of oral hypoglycemic drugs: Secondary | ICD-10-CM | POA: Insufficient documentation

## 2021-02-05 DIAGNOSIS — R0981 Nasal congestion: Secondary | ICD-10-CM

## 2021-02-05 DIAGNOSIS — R062 Wheezing: Secondary | ICD-10-CM | POA: Diagnosis not present

## 2021-02-05 DIAGNOSIS — R059 Cough, unspecified: Secondary | ICD-10-CM | POA: Diagnosis not present

## 2021-02-05 DIAGNOSIS — Z87891 Personal history of nicotine dependence: Secondary | ICD-10-CM | POA: Diagnosis not present

## 2021-02-05 DIAGNOSIS — E119 Type 2 diabetes mellitus without complications: Secondary | ICD-10-CM | POA: Diagnosis not present

## 2021-02-05 DIAGNOSIS — Z79899 Other long term (current) drug therapy: Secondary | ICD-10-CM | POA: Insufficient documentation

## 2021-02-05 DIAGNOSIS — I10 Essential (primary) hypertension: Secondary | ICD-10-CM | POA: Diagnosis not present

## 2021-02-05 DIAGNOSIS — Z951 Presence of aortocoronary bypass graft: Secondary | ICD-10-CM | POA: Diagnosis not present

## 2021-02-05 LAB — CBG MONITORING, ED: Glucose-Capillary: 253 mg/dL — ABNORMAL HIGH (ref 70–99)

## 2021-02-05 LAB — SARS CORONAVIRUS 2 (TAT 6-24 HRS): SARS Coronavirus 2: NEGATIVE

## 2021-02-05 MED ORDER — ALBUTEROL SULFATE HFA 108 (90 BASE) MCG/ACT IN AERS
2.0000 | INHALATION_SPRAY | Freq: Once | RESPIRATORY_TRACT | Status: AC
  Administered 2021-02-05: 2 via RESPIRATORY_TRACT
  Filled 2021-02-05: qty 6.7

## 2021-02-05 MED ORDER — LORATADINE 10 MG PO TABS: 10.0000 mg | ORAL_TABLET | Freq: Every day | ORAL | 0 refills | Status: AC

## 2021-02-05 MED ORDER — FLUTICASONE PROPIONATE 50 MCG/ACT NA SUSP: 2.0000 | Freq: Every day | NASAL | 0 refills | Status: AC

## 2021-02-05 MED ORDER — AEROCHAMBER PLUS FLO-VU MEDIUM MISC
1.0000 | Freq: Once | Status: AC
  Administered 2021-02-05: 1
  Filled 2021-02-05: qty 1

## 2021-02-05 NOTE — ED Provider Notes (Signed)
[facility] EMERGENCY DEPARTMENT Provider Note   CSN: [id number] Arrival date & time: 02/05/21  1202     History Chief Complaint  Patient presents with  . Nasal Congestion    Cindy Garrett is a 55 y.o. female.  HPI    55 year old female with a history of depression, diabetes, hyperlipidemia, hypertension, CVA, who presents to the emergency department today for evaluation of upper respiratory symptoms.  States that for the last few days she has had rhinorrhea, nasal congestion, cough, wheezing, itchy/watery eyes and sneezing.  She does have a history of allergies but does not currently take anything for them.  She has had no fevers, shortness of breath or chest pain.    Past Medical History:  Diagnosis Date  . Depression   . Diabetes mellitus without complication (HCC)   . Hyperlipidemia   . Hypertension   . Stroke [facility])     Patient Active Problem List   Diagnosis Date Noted  . Coronary artery disease involving native heart 01/08/2021  . Type 2 diabetes mellitus with hyperglycemia, without long-term current use of insulin (HCC) 04/21/2020  . 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 History of MI (myocardial infarction)   . Dyslipidemia 07/30/2016  . Essential (primary) hypertension 07/30/2016  . History of CVA (cerebrovascular accident) 07/30/2016  . Gastroesophageal reflux disease without esophagitis 07/17/2016    Past Surgical History:  Procedure Laterality Date  . COLONOSCOPY N/A 05/26/2015   Procedure: COLONOSCOPY;  Surgeon: West Bali, MD;  Location: [facility] ENDO SUITE;  Service: Endoscopy;  Laterality: N/A;  2:00  . CORONARY ARTERY BYPASS GRAFT N/A 12/10/2016   Procedure: CORONARY ARTERY BYPASS GRAFTING (CABG) times three using the left internal mammary artery and the right greater saphenous vein;  Surgeon: Alleen Borne, MD;  Location: [facility] OR;  Service: Open Heart Surgery;  Laterality: N/A;  . LEFT HEART CATH AND CORONARY ANGIOGRAPHY N/A 12/10/2016   Procedure: Left Heart Cath and  Coronary Angiography;  Surgeon: Lennette Bihari, MD;  Location: [facility] INVASIVE CV LAB;  Service: Cardiovascular;  Laterality: N/A;  . TEE WITHOUT CARDIOVERSION N/A 12/10/2016   Procedure: TRANSESOPHAGEAL ECHOCARDIOGRAM (TEE);  Surgeon: Alleen Borne, MD;  Location: [facility] OR;  Service: Open Heart Surgery;  Laterality: N/A;  . TUBAL LIGATION       OB History    Gravida  2   Para  2   Term  2   Preterm      AB      Living  2     SAB      IAB      Ectopic      Multiple      Live Births              Family History  Problem Relation Age of Onset  . Diabetes Father   . Heart disease Mother   . Hyperlipidemia Sister   . Hypertension Sister   . Mental illness Brother     Social History   Tobacco Use  . Smoking status: Former Smoker    Packs/day: 1.00    Years: 30.00    Pack years: 30.00    Types: Cigarettes    Quit date: 12/10/2016    Years since quitting: 4.1  . Smokeless tobacco: Never Used  Vaping Use  . Vaping Use: Never used  Substance Use Topics  . Alcohol use: Not Currently    Comment: rarely  . Drug use: No    Home Medications Prior to  Admission medications   Medication Sig Start Date End Date Taking? Authorizing Provider  fluticasone (FLONASE) 50 MCG/ACT nasal spray Place 2 sprays into both nostrils daily. 02/05/21  Yes Trine Fread S, PA-C  loratadine (CLARITIN) 10 MG tablet Take 1 tablet (10 mg total) by mouth daily. 02/05/21 03/07/21 Yes Jody Silas S, PA-C  atorvastatin (LIPITOR) 80 MG tablet Take 1 tablet (80 mg total) by mouth daily at 6 PM. 01/08/21   Gwenlyn Fudge, FNP  clopidogrel (PLAVIX) 75 MG tablet Take 1 tablet (75 mg total) by mouth daily. 01/08/21   Gwenlyn Fudge, FNP  lisinopril (ZESTRIL) 10 MG tablet Take 1 tablet (10 mg total) by mouth daily. 01/08/21   Gwenlyn Fudge, FNP  metFORMIN (GLUCOPHAGE-XR) 500 MG 24 hr tablet Take 1 tablet (500 mg total) by mouth 2 (two) times daily with a meal. 01/08/21   Gwenlyn Fudge, FNP  metoprolol  tartrate (LOPRESSOR) 25 MG tablet Take 1 tablet (25 mg total) by mouth daily. 01/08/21   Gwenlyn Fudge, FNP    Allergies    Penicillins  Review of Systems   Review of Systems  Constitutional: Negative for chills and fever.  HENT: Positive for congestion, rhinorrhea and sneezing. Negative for ear pain and sore throat.   Eyes: Positive for discharge (watering) and itching. Negative for pain and visual disturbance.  Respiratory: Positive for cough and wheezing. Negative for shortness of breath.   Cardiovascular: Negative for chest pain.  Gastrointestinal: Negative for abdominal pain, constipation, diarrhea, nausea and vomiting.  Genitourinary: Negative for dysuria and hematuria.  Musculoskeletal: Negative for back pain.  Skin: Negative for rash.  Neurological: Negative for seizures and syncope.  All other systems reviewed and are negative.   Physical Exam Updated Vital Signs BP (!) 173/89 (BP Location: Right Arm)   Pulse 87   Resp 20   Ht 5\' 2"  (1.575 m)   Wt 82.1 kg   LMP 08/03/2013   SpO2 95%   BMI 33.11 kg/m   Physical Exam Vitals and nursing note reviewed.  Constitutional:      General: She is not in acute distress.    Appearance: She is well-developed.  HENT:     Head: Normocephalic and atraumatic.     Nose:     Comments: Nasal turbinates are enlarged bilaterally Eyes:     Conjunctiva/sclera: Conjunctivae normal.  Cardiovascular:     Rate and Rhythm: Normal rate and regular rhythm.     Heart sounds: Normal heart sounds. No murmur heard.   Pulmonary:     Effort: Pulmonary effort is normal. No respiratory distress.     Breath sounds: Wheezing present.  Abdominal:     General: Bowel sounds are normal.     Palpations: Abdomen is soft.     Tenderness: There is no abdominal tenderness. There is no guarding or rebound.  Musculoskeletal:     Cervical back: Neck supple.  Skin:    General: Skin is warm and dry.  Neurological:     Mental Status: She is alert.      ED Results / Procedures / Treatments   Labs (all labs ordered are listed, but only abnormal results are displayed) Labs Reviewed  CBG MONITORING, ED - Abnormal; Notable for the following components:      Result Value   Glucose-Capillary 253 (*)    All other components within normal limits  SARS CORONAVIRUS 2 (TAT 6-24 HRS)    EKG None  Radiology DG Chest [facility] 1 [address]  Result Date: 02/05/2021 CLINICAL DATA:  Cough. EXAM: PORTABLE CHEST 1 VIEW COMPARISON:  January 22, 2017. FINDINGS: The heart size and mediastinal contours are within normal limits. Both lungs are clear. The visualized skeletal structures are unremarkable. IMPRESSION: No active disease. Electronically Signed   By: Lupita Raider M.D.   On: 02/05/2021 13:41    Procedures Procedures   Medications Ordered in ED Medications  AeroChamber Plus Flo-Vu Medium MISC 1 each (has no administration in time range)  albuterol (VENTOLIN HFA) 108 (90 Base) MCG/ACT inhaler 2 puff (2 puffs Inhalation Given 02/05/21 1336)    ED Course  I have reviewed the triage vital signs and the nursing notes.  Pertinent labs & imaging results that were available during my care of the patient were reviewed by me and considered in my medical decision making (see chart for details).    MDM Rules/Calculators/A&P                          Pt CXR negative for acute infiltrate. Patients symptoms are consistent with URI, likely viral etiology. covid test sent and pending at discharge.  Pt will be discharged with symptomatic treatment. She received albuterol in the ED and sxs improved. Verbalizes understanding and is agreeable with plan. Pt is hemodynamically stable & in NAD prior to dc.  Letica Giaimo was evaluated in Emergency Department on 02/05/2021 for the symptoms described in the history of present illness. She was evaluated in the context of the global COVID-19 pandemic, which necessitated consideration that the patient might be at risk for  infection with the SARS-CoV-2 virus that causes COVID-19. Institutional protocols and algorithms that pertain to the evaluation of patients at risk for COVID-19 are in a state of rapid change based on information released by regulatory bodies including the CDC and federal and state organizations. These policies and algorithms were followed during the patient's care in the ED.   Final Clinical Impression(s) / ED Diagnoses Final diagnoses:  Nasal congestion    Rx / DC Orders ED Discharge Orders         Ordered    loratadine (CLARITIN) 10 MG tablet  Daily        02/05/21 1446    fluticasone (FLONASE) 50 MCG/ACT nasal spray  Daily        02/05/21 [address], Pepe Mineau S, PA-C 02/05/21 1454    Bethann Berkshire, MD 02/07/21 [phone]

## 2021-02-05 NOTE — ED Triage Notes (Signed)
Nasal congestion with cough

## 2021-02-05 NOTE — Discharge Instructions (Addendum)
Use two puffs of the inhaler as needed every 4-6 hours  Please use claritin and flonase as directed.   You were tested for covid. If your test comes back positive, You should be isolated for at least 5 days since the onset of your symptoms AND >72 hours after symptoms resolution (absence of fever without the use of fever reducing medicaiton and improvement in respiratory symptoms), whichever is longer  Please follow up with your primary care provider within 5-7 days for re-evaluation of your symptoms. If you do not have a primary care provider, information for a healthcare clinic has been provided for you to make arrangements for follow up care. Please return to the emergency department for any new or worsening symptoms.

## 2021-02-21 ENCOUNTER — Encounter: Payer: Self-pay | Admitting: *Deleted

## 2021-04-06 ENCOUNTER — Encounter: Payer: Self-pay | Admitting: Family Medicine

## 2021-04-06 ENCOUNTER — Ambulatory Visit: Payer: Medicaid Other | Admitting: Family Medicine

## 2021-04-10 ENCOUNTER — Ambulatory Visit: Payer: Medicaid Other | Admitting: Family Medicine

## 2021-04-24 ENCOUNTER — Other Ambulatory Visit: Payer: Self-pay | Admitting: Family Medicine

## 2021-04-24 ENCOUNTER — Encounter: Payer: Self-pay | Admitting: Family Medicine

## 2021-04-24 ENCOUNTER — Other Ambulatory Visit: Payer: Self-pay

## 2021-04-24 ENCOUNTER — Ambulatory Visit: Payer: Medicaid Other | Admitting: Family Medicine

## 2021-04-24 VITALS — BP 128/74 | HR 62 | Temp 98.0°F | Ht 62.0 in | Wt 185.4 lb

## 2021-04-24 DIAGNOSIS — I10 Essential (primary) hypertension: Secondary | ICD-10-CM

## 2021-04-24 DIAGNOSIS — I25118 Atherosclerotic heart disease of native coronary artery with other forms of angina pectoris: Secondary | ICD-10-CM | POA: Diagnosis not present

## 2021-04-24 DIAGNOSIS — I252 Old myocardial infarction: Secondary | ICD-10-CM

## 2021-04-24 DIAGNOSIS — Z1231 Encounter for screening mammogram for malignant neoplasm of breast: Secondary | ICD-10-CM

## 2021-04-24 DIAGNOSIS — E785 Hyperlipidemia, unspecified: Secondary | ICD-10-CM | POA: Diagnosis not present

## 2021-04-24 DIAGNOSIS — E1165 Type 2 diabetes mellitus with hyperglycemia: Secondary | ICD-10-CM | POA: Diagnosis not present

## 2021-04-24 LAB — LIPID PANEL
Chol/HDL Ratio: 4.4 ratio (ref 0.0–4.4)
Cholesterol, Total: 161 mg/dL (ref 100–199)
HDL: 37 mg/dL — ABNORMAL LOW (ref >39)
LDL Chol Calc (NIH): 97 mg/dL (ref 0–99)
Triglycerides: 154 mg/dL — ABNORMAL HIGH (ref 0–149)
VLDL Cholesterol Cal: 27 mg/dL (ref 5–40)

## 2021-04-24 LAB — CBC WITH DIFFERENTIAL/PLATELET
Basophils Absolute: 0.1 10*3/uL (ref 0.0–0.2)
Basos: 1 %
EOS (ABSOLUTE): 0.4 10*3/uL (ref 0.0–0.4)
Eos: 5 %
Hematocrit: 41.9 % (ref 34.0–46.6)
Hemoglobin: 14.6 g/dL (ref 11.1–15.9)
Immature Grans (Abs): 0 10*3/uL (ref 0.0–0.1)
Immature Granulocytes: 0 %
Lymphocytes Absolute: 2 10*3/uL (ref 0.7–3.1)
Lymphs: 29 %
MCH: 30 pg (ref 26.6–33.0)
MCHC: 34.8 g/dL (ref 31.5–35.7)
MCV: 86 fL (ref 79–97)
Monocytes Absolute: 0.6 10*3/uL (ref 0.1–0.9)
Monocytes: 8 %
Neutrophils Absolute: 4.1 10*3/uL (ref 1.4–7.0)
Neutrophils: 57 %
Platelets: 402 10*3/uL (ref 150–450)
RBC: 4.87 x10E6/uL (ref 3.77–5.28)
RDW: 12.6 % (ref 11.7–15.4)
WBC: 7.2 10*3/uL (ref 3.4–10.8)

## 2021-04-24 LAB — CMP14+EGFR
ALT: 13 IU/L (ref 0–32)
AST: 12 IU/L (ref 0–40)
Albumin/Globulin Ratio: 1.5 (ref 1.2–2.2)
Albumin: 4.1 g/dL (ref 3.8–4.9)
Alkaline Phosphatase: 131 IU/L — ABNORMAL HIGH (ref 44–121)
BUN/Creatinine Ratio: 15 (ref 9–23)
BUN: 12 mg/dL (ref 6–24)
Bilirubin Total: 0.5 mg/dL (ref 0.0–1.2)
CO2: 22 mmol/L (ref 20–29)
Calcium: 9.7 mg/dL (ref 8.7–10.2)
Chloride: 99 mmol/L (ref 96–106)
Creatinine, Ser: 0.81 mg/dL (ref 0.57–1.00)
Globulin, Total: 2.7 g/dL (ref 1.5–4.5)
Glucose: 230 mg/dL — ABNORMAL HIGH (ref 65–99)
Potassium: 4.7 mmol/L (ref 3.5–5.2)
Sodium: 136 mmol/L (ref 134–144)
Total Protein: 6.8 g/dL (ref 6.0–8.5)
eGFR: 85 mL/min/{1.73_m2} (ref >59)

## 2021-04-24 LAB — BAYER DCA HB A1C WAIVED: HB A1C (BAYER DCA - WAIVED): 7.6 % — ABNORMAL HIGH (ref <7.0)

## 2021-04-24 MED ORDER — METOPROLOL TARTRATE 25 MG PO TABS: 25.0000 mg | ORAL_TABLET | Freq: Every day | ORAL | 1 refills | Status: DC

## 2021-04-24 MED ORDER — METFORMIN HCL ER 500 MG PO TB24: ORAL_TABLET | ORAL | 2 refills | Status: DC

## 2021-04-24 MED ORDER — LOSARTAN POTASSIUM 50 MG PO TABS: 50.0000 mg | ORAL_TABLET | Freq: Every day | ORAL | 2 refills | Status: DC

## 2021-04-24 NOTE — Progress Notes (Signed)
Assessment & Plan:  1. Type 2 diabetes mellitus with hyperglycemia, without long-term current use of insulin (HCC) Lab Results  Component Value Date   HGBA1C 7.4 (H) 01/08/2021   HGBA1C 8.0 (H) 01/20/2020  A1c 7.6 today which is higher than 7.4 three months ago. - Diabetes is not at goal of A1c < 7. - Medications:  Increase Metformin to 1,000 mg in the AM and 500 mg in the PM - Home glucose monitoring: encouraged to monitor - Patient is currently taking a statin. Patient is taking an ACE-inhibitor/ARB.  - Instruction/counseling given: discussed diet and provided printed educational material  Diabetes Health Maintenance Due  Topic Date Due   OPHTHALMOLOGY EXAM  Never done   FOOT EXAM  04/24/2022 (Originally 01/19/2021)   HEMOGLOBIN A1C  07/11/2021    Lab Results  Component Value Date   LABMICR 174.3 01/20/2020   - Lipid panel - CMP14+EGFR - CBC with Differential/Platelet - Bayer DCA Hb A1c Waived - metFORMIN (GLUCOPHAGE-XR) 500 MG 24 hr tablet; Take 2 tablets (1,000 mg total) by mouth daily with breakfast AND 1 tablet (500 mg total) daily with supper.  Dispense: 90 tablet; Refill: 2  2. Essential (primary) hypertension Improving. Discussed goal is <130/90. Switching from Lisinopril to Losartan due to a cough with Lisinopril. Encouraged to monitor BP at home and keep a log to bring back with her to her next appointment.  - Lipid panel - CMP14+EGFR - CBC with Differential/Platelet - metoprolol tartrate (LOPRESSOR) 25 MG tablet; Take 1 tablet (25 mg total) by mouth daily.  Dispense: 90 tablet; Refill: 1 - losartan (COZAAR) 50 MG tablet; Take 1 tablet (50 mg total) by mouth daily.  Dispense: 30 tablet; Refill: 2  3. Dyslipidemia Labs to assess since resuming Atorvastatin. - Lipid panel - CMP14+EGFR  4. Coronary artery disease involving native coronary artery of native heart with other form of angina pectoris ([place]) On a statin. - Lipid panel - CMP14+EGFR  5. History of MI  (myocardial infarction) On a statin. - Lipid panel - CMP14+EGFR   Return in about 3 months (around 07/25/2021) for annual physical w. pap, DM.  Hendricks Limes, MSN, APRN, FNP-C [facility]  Subjective:    Patient ID: Cindy Garrett, female    DOB: [birth year], 55 y.o.   MRN: [id number]  Patient Care Team: Loman Brooklyn, FNP as PCP - General (Family Medicine) Harl Bowie Alphonse Guild, MD as PCP - Cardiology (Cardiology) Harlen Labs, MD as Referring Physician (Optometry)   Chief Complaint:  Chief Complaint  Patient presents with   Diabetes    3 month follow up    HPI: Cindy Garrett is a 55 y.o. female presenting on 04/24/2021 for Diabetes (3 month follow up)  Diabetes: Patient presents for follow up of diabetes. Current symptoms include: hyperglycemia. Known diabetic complications: cerebrovascular disease. Medication compliance: only taking metformin 500 mg once daily. Current diet: in general, a "healthy" diet  , eating better; using an air fryer . Current exercise: walking. Home blood sugar records: patient does not check sugars. Is she  on ACE inhibitor or angiotensin II receptor blocker? Yes. Is she on a statin? Yes.   Hypertension: patient does monitor her blood pressure at home. States her readings are normally 130s/70s. She does still occasionally have some high readings. Reports a cough since resuming Lisinopril.   New complaints: None  Social history:  Relevant past medical, surgical, family and social history reviewed and updated as indicated. Interim medical history since  our last visit reviewed.  Allergies and medications reviewed and updated.  DATA REVIEWED: CHART IN EPIC  ROS: Negative unless specifically indicated above in HPI.    Current Outpatient Medications:    atorvastatin (LIPITOR) 80 MG tablet, Take 1 tablet (80 mg total) by mouth daily at 6 PM., Disp: 90 tablet, Rfl: 1   clopidogrel (PLAVIX) 75 MG tablet, Take 1 tablet (75 mg  total) by mouth daily., Disp: 90 tablet, Rfl: 1   fluticasone (FLONASE) 50 MCG/ACT nasal spray, Place 2 sprays into both nostrils daily., Disp: 16 g, Rfl: 0   lisinopril (ZESTRIL) 10 MG tablet, Take 1 tablet (10 mg total) by mouth daily., Disp: 90 tablet, Rfl: 1   metFORMIN (GLUCOPHAGE-XR) 500 MG 24 hr tablet, Take 1 tablet (500 mg total) by mouth 2 (two) times daily with a meal., Disp: 60 tablet, Rfl: 2   metoprolol tartrate (LOPRESSOR) 25 MG tablet, Take 1 tablet (25 mg total) by mouth daily., Disp: 90 tablet, Rfl: 1   loratadine (CLARITIN) 10 MG tablet, Take 1 tablet (10 mg total) by mouth daily., Disp: 30 tablet, Rfl: 0   Allergies  Allergen Reactions   Penicillins Itching and Rash    Has patient had a PCN reaction causing immediate rash, facial/tongue/throat swelling, SOB or lightheadedness with hypotension: Yes Has patient had a PCN reaction causing severe rash involving mucus membranes or skin necrosis: Yes Has patient had a PCN reaction that required hospitalization Yes Has patient had a PCN reaction occurring within the last 10 years: No If all of the above answers are "NO", then may proceed with Cephalosporin use.    Past Medical History:  Diagnosis Date   Depression    Diabetes mellitus without complication ([place])    Hyperlipidemia    Hypertension    Stroke [facility])     Past Surgical History:  Procedure Laterality Date   COLONOSCOPY N/A 05/26/2015   Procedure: COLONOSCOPY;  Surgeon: Danie Binder, MD;  [facility];  Service: Endoscopy;  Laterality: N/A;  2:00   CORONARY ARTERY BYPASS GRAFT N/A 12/10/2016   Procedure: CORONARY ARTERY BYPASS GRAFTING (CABG) times three using the left internal mammary artery and the right greater saphenous vein;  Surgeon: Gaye Pollack, MD;  Location: [place];  Service: Open Heart Surgery;  Laterality: N/A;   LEFT HEART CATH AND CORONARY ANGIOGRAPHY N/A 12/10/2016   Procedure: Left Heart Cath and Coronary Angiography;  Surgeon: Troy Sine, MD;  Location: [place] CV LAB;  Service: Cardiovascular;  Laterality: N/A;   TEE WITHOUT CARDIOVERSION N/A 12/10/2016   Procedure: TRANSESOPHAGEAL ECHOCARDIOGRAM (TEE);  Surgeon: Gaye Pollack, MD;  Location: [place];  Service: Open Heart Surgery;  Laterality: N/A;   TUBAL LIGATION      Social History   Socioeconomic History   Marital status: Legally Separated    Spouse name: Not on file   Number of children: Not on file   Years of education: Not on file   Highest education level: Not on file  Occupational History   Not on file  Tobacco Use   Smoking status: Former    Packs/day: 1.00    Years: 30.00    Pack years: 30.00    Types: Cigarettes    Quit date: 12/10/2016    Years since quitting: 4.3   Smokeless tobacco: Never  Vaping Use   Vaping Use: Never used  Substance and Sexual Activity   Alcohol use: Not Currently    Comment: rarely  Drug use: No   Sexual activity: Not Currently    Birth control/protection: Surgical  Other Topics Concern   Not on file  Social History Narrative   Not on file   Social Determinants of Health   Financial Resource Strain: Not on file  Food Insecurity: Not on file  Transportation Needs: Not on file  Physical Activity: Not on file  Stress: Not on file  Social Connections: Not on file  Intimate Partner Violence: Not on file        Objective:    BP (!) 149/79   Pulse 62   Temp 98 F (36.7 C) (Temporal)   Ht '5\' 2"'  (1.575 m)   Wt 185 lb 6.4 oz (84.1 kg)   LMP 08/03/2013   SpO2 98%   BMI 33.91 kg/m   Wt Readings from Last 3 Encounters:  02/05/21 181 lb (82.1 kg)  01/08/21 181 lb 3.2 oz (82.2 kg)  01/20/20 195 lb 3.2 oz (88.5 kg)    Physical Exam Vitals reviewed.  Constitutional:      General: She is not in acute distress.    Appearance: Normal appearance. She is obese. She is not ill-appearing, toxic-appearing or diaphoretic.  HENT:     Head: Normocephalic and atraumatic.  Eyes:     General: No scleral icterus.        Right eye: No discharge.        Left eye: No discharge.     Conjunctiva/sclera: Conjunctivae normal.  Cardiovascular:     Rate and Rhythm: Normal rate and regular rhythm.     Heart sounds: Normal heart sounds. No murmur heard.   No friction rub. No gallop.  Pulmonary:     Effort: Pulmonary effort is normal. No respiratory distress.     Breath sounds: Normal breath sounds. No stridor. No wheezing, rhonchi or rales.  Musculoskeletal:        General: Normal range of motion.     Cervical back: Normal range of motion.  Skin:    General: Skin is warm and dry.     Capillary Refill: Capillary refill takes less than 2 seconds.  Neurological:     General: No focal deficit present.     Mental Status: She is alert and oriented to person, place, and time. Mental status is at baseline.  Psychiatric:        Mood and Affect: Mood normal.        Behavior: Behavior normal.        Thought Content: Thought content normal.        Judgment: Judgment normal.    No results found for: TSH Lab Results  Component Value Date   WBC 7.0 01/08/2021   HGB 14.3 01/08/2021   HCT 41.6 01/08/2021   MCV 86 01/08/2021   PLT 428 01/08/2021   Lab Results  Component Value Date   NA 138 01/08/2021   K 4.4 01/08/2021   CO2 22 01/08/2021   GLUCOSE 156 (H) 01/08/2021   BUN 15 01/08/2021   CREATININE 0.77 01/08/2021   BILITOT 0.3 01/08/2021   ALKPHOS 120 01/08/2021   AST 14 01/08/2021   ALT 12 01/08/2021   PROT 7.3 01/08/2021   ALBUMIN 4.5 01/08/2021   CALCIUM 9.0 01/08/2021   ANIONGAP 10 12/13/2016   EGFR 91 01/08/2021   Lab Results  Component Value Date   CHOL 210 (H) 01/08/2021   Lab Results  Component Value Date   HDL 38 (L) 01/08/2021   Lab Results  Component  Value Date   LDLCALC 138 (H) 01/08/2021   Lab Results  Component Value Date   TRIG 188 (H) 01/08/2021   Lab Results  Component Value Date   CHOLHDL 5.5 (H) 01/08/2021   Lab Results  Component Value Date   HGBA1C 7.4 (H)  01/08/2021

## 2021-04-24 NOTE — Patient Instructions (Signed)
Monitor your blood pressure at home and keep a log to bring back with you to your next appointment.

## 2021-06-26 ENCOUNTER — Ambulatory Visit: Payer: Medicaid Other | Admitting: Nurse Practitioner

## 2021-06-28 ENCOUNTER — Encounter: Payer: Self-pay | Admitting: Family Medicine

## 2021-07-26 ENCOUNTER — Ambulatory Visit (INDEPENDENT_AMBULATORY_CARE_PROVIDER_SITE_OTHER): Payer: Medicaid Other | Admitting: Family Medicine

## 2021-07-26 ENCOUNTER — Encounter: Payer: Self-pay | Admitting: Family Medicine

## 2021-07-26 ENCOUNTER — Other Ambulatory Visit (HOSPITAL_COMMUNITY)
Admission: RE | Admit: 2021-07-26 | Discharge: 2021-07-26 | Disposition: A | Discharge status: Home / Self Care | Payer: Medicaid Other | Source: Ambulatory Visit | Attending: Family Medicine | Admitting: Family Medicine

## 2021-07-26 ENCOUNTER — Other Ambulatory Visit: Payer: Self-pay

## 2021-07-26 VITALS — BP 135/77 | HR 63 | Temp 97.2°F | Ht 62.0 in | Wt 185.8 lb

## 2021-07-26 DIAGNOSIS — Z113 Encounter for screening for infections with a predominantly sexual mode of transmission: Secondary | ICD-10-CM | POA: Insufficient documentation

## 2021-07-26 DIAGNOSIS — E1165 Type 2 diabetes mellitus with hyperglycemia: Secondary | ICD-10-CM

## 2021-07-26 DIAGNOSIS — E785 Hyperlipidemia, unspecified: Secondary | ICD-10-CM | POA: Diagnosis not present

## 2021-07-26 DIAGNOSIS — Z124 Encounter for screening for malignant neoplasm of cervix: Secondary | ICD-10-CM | POA: Diagnosis not present

## 2021-07-26 DIAGNOSIS — Z0001 Encounter for general adult medical examination with abnormal findings: Secondary | ICD-10-CM

## 2021-07-26 DIAGNOSIS — I10 Essential (primary) hypertension: Secondary | ICD-10-CM

## 2021-07-26 DIAGNOSIS — I25118 Atherosclerotic heart disease of native coronary artery with other forms of angina pectoris: Secondary | ICD-10-CM

## 2021-07-26 DIAGNOSIS — Z87891 Personal history of nicotine dependence: Secondary | ICD-10-CM

## 2021-07-26 DIAGNOSIS — L602 Onychogryphosis: Secondary | ICD-10-CM

## 2021-07-26 DIAGNOSIS — Z1151 Encounter for screening for human papillomavirus (HPV): Secondary | ICD-10-CM

## 2021-07-26 DIAGNOSIS — Z8673 Personal history of transient ischemic attack (TIA), and cerebral infarction without residual deficits: Secondary | ICD-10-CM

## 2021-07-26 DIAGNOSIS — L853 Xerosis cutis: Secondary | ICD-10-CM

## 2021-07-26 DIAGNOSIS — Z0000 Encounter for general adult medical examination without abnormal findings: Secondary | ICD-10-CM

## 2021-07-26 LAB — BAYER DCA HB A1C WAIVED: HB A1C (BAYER DCA - WAIVED): 8 % — ABNORMAL HIGH (ref 4.8–5.6)

## 2021-07-26 MED ORDER — METFORMIN HCL ER 500 MG PO TB24
1000.0000 mg | ORAL_TABLET | Freq: Two times a day (BID) | ORAL | 1 refills | Status: DC
Start: 2021-07-26 — End: 2022-01-04

## 2021-07-26 MED ORDER — BLOOD GLUCOSE MONITOR KIT: PACK | 0 refills | Status: DC

## 2021-07-26 MED ORDER — LOSARTAN POTASSIUM 100 MG PO TABS: 100.0000 mg | ORAL_TABLET | Freq: Every day | ORAL | 1 refills | Status: DC

## 2021-07-26 MED ORDER — CLOPIDOGREL BISULFATE 75 MG PO TABS: 75.0000 mg | ORAL_TABLET | Freq: Every day | ORAL | 1 refills | Status: DC

## 2021-07-26 MED ORDER — TRULICITY 0.75 MG/0.5ML ~~LOC~~ SOAJ: 0.7500 mg | SUBCUTANEOUS | 2 refills | Status: DC

## 2021-07-26 MED ORDER — LOSARTAN POTASSIUM 50 MG PO TABS: 50.0000 mg | ORAL_TABLET | Freq: Every day | ORAL | 1 refills | Status: DC

## 2021-07-26 MED ORDER — ATORVASTATIN CALCIUM 80 MG PO TABS: 80.0000 mg | ORAL_TABLET | Freq: Every day | ORAL | 1 refills | Status: DC

## 2021-07-26 NOTE — Progress Notes (Signed)
Assessment & Plan:  1. Well adult exam - preventative health information provided - encouraged healthy diet and exercise - Lipid panel - CBC with Differential/Platelet - CMP14+EGFR  2. Type 2 diabetes mellitus with hyperglycemia, without long-term current use of insulin (HCC) - poorly controlled, increasing metformin to 1000 mg BID and adding trulicity - ordered new glucometer - patient not agreeable to $75 monthly for freestyle libre - encouraged healthy diet and exercise - Lipid panel - CBC with Differential/Platelet - CMP14+EGFR - Bayer DCA Hb A1c Waived 8 today, up from 7.4 at previous visit - atorvastatin (LIPITOR) 80 MG tablet; Take 1 tablet (80 mg total) by mouth daily at 6 PM.  Dispense: 90 tablet; Refill: 1 - Vitamin B12 - Ambulatory referral to Podiatry - metFORMIN (GLUCOPHAGE-XR) 500 MG 24 hr tablet; Take 2 tablets (1,000 mg total) by mouth 2 (two) times daily with a meal.  Dispense: 180 tablet; Refill: 1 - Dulaglutide (TRULICITY) 6.60 YT/0.1SW SOPN; Inject 0.75 mg into the skin once a week.  Dispense: 2 mL; Refill: 2 - blood glucose meter kit and supplies KIT; Dispense based on patient and insurance preference. Use up to four times daily as directed.  Dispense: 1 each; Refill: 0  3. Essential (primary) hypertension - poorly controlled, increasing losartan from 50 mg to 100 mg QD - patient to monitor BP at home and keep a log to bring with her to the next appointment - Lipid panel - CBC with Differential/Platelet - CMP14+EGFR - losartan (COZAAR) 100 MG tablet; Take 1 tablet (100 mg total) by mouth daily.  Dispense: 90 tablet; Refill: 1  4. Dyslipidemia - encouraged healthy diet and exercise - Lipid panel - CMP14+EGFR - atorvastatin (LIPITOR) 80 MG tablet; Take 1 tablet (80 mg total) by mouth daily at 6 PM.  Dispense: 90 tablet; Refill: 1  5. 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 Continue statin. - Lipid panel - CMP14+EGFR - atorvastatin (LIPITOR) 80 MG tablet; Take 1 tablet (80 mg total) by mouth daily at 6 PM.  Dispense: 90 tablet; Refill: 1  6. History of CVA (cerebrovascular accident) - on a statin and plavix - Lipid panel - CMP14+EGFR - clopidogrel (PLAVIX) 75 MG tablet; Take 1 tablet (75 mg total) by mouth daily.  Dispense: 90 tablet; Refill: 1  7. Screening for cervical cancer - Cytology - PAP  8. Routine screening for STI (sexually transmitted infection) - Cytology - PAP  9. Screening for human papillomavirus (HPV) - Cytology - PAP  10. Overgrown toenails - ambulatory referral to podiatry  11. Dry skin - encouraged to use CeraVe Anti-Itch moisturizer  12. History of tobacco use - CT CHEST LUNG CA SCREEN LOW DOSE W/O CM; Future   Follow-up: Return in about 6 weeks (around 09/06/2021) for HTN.   Cindy Crater, NP Student  I personally was present during the history, physical exam, and medical decision-making activities of this service and have verified that the service and findings are accurately documented in the nurse practitioner student's note.  Hendricks Limes, MSN, APRN, FNP-C [facility]   Subjective:  Patient ID: Cindy Garrett, female    DOB: 07/03/1966  Age: 55 y.o. MRN: [id number]  Patient Care Team: Loman Brooklyn, FNP as PCP - General (Family Medicine) Harl Bowie Alphonse Guild, MD as PCP - Cardiology (Cardiology) Harlen Labs, MD as Referring Physician (Optometry)   CC:  Chief Complaint  Patient presents with   Gynecologic Exam   Rash    Upper bilateral arms x 3 months     HPI Cindy Garrett presents for annual physical with gynecologic exam.  Occupation: employed, Marital status: legally separated, Substance use: none Diet: regular, attempts low sodium and low sugar, Exercise: yes, walking Last eye exam: June 2022, records requested Last dental exam: never, poor  dentition Last colonoscopy: 05/26/15 Last mammogram: scheduled 9/28 Last pap smear: will get today Lung Cancer Screening with low-dose Chest CT: willing to obtain  Hepatitis C Screening: declined Immunizations: Flu Vaccine: declined Tdap Vaccine: declined  Shingrix Vaccine: declined  COVID-19 Vaccine: declined Pneumonia Vaccine: declined  Hypertension: She is taking her losartan and metoprolol as prescribed. She is taking her blood pressures at home but did not bring a log. She states she has normally high blood pressure but her range is 117-185/75-110.   Diabetes: Patient presents for follow up of diabetes. Current symptoms include: hyperglycemia. Known diabetic complications: cardiovascular disease. Medication compliance: no, she is taking Metformin 1000 mg in the morning and but not taking the 500 mg at night. Current diet: in general, an "unhealthy" diet, attempts low sodium but states it is difficult to avoid sugar as much . Current exercise: walking. Home blood sugar records: patient does not check sugars, she needs a new meter . Is she  on ACE inhibitor or angiotensin II receptor blocker? Yes. Is she on a statin? Yes.    DEPRESSION SCREENING PHQ 2/9 Scores 07/26/2021 04/24/2021 01/08/2021 01/20/2020 04/14/2017  PHQ - 2 Score 0 0 0 6 2  PHQ- 9 Score 0 _0 Review of Systems  Constitutional: Negative.  Negative for chills, fever, malaise/fatigue and weight loss.  HENT: Negative.  Negative for congestion, ear discharge, ear pain, hearing loss and sinus pain.   Eyes: Negative.  Negative for blurred vision, pain, discharge and redness.  Respiratory:  Positive for wheezing (only with seasonal allergies). Negative for cough and shortness of breath.   Cardiovascular: Negative.  Negative for chest pain, palpitations, orthopnea and leg swelling.  Gastrointestinal: Negative.  Negative for abdominal pain, constipation, diarrhea, heartburn, nausea and vomiting.  Genitourinary: Negative.   Negative for dysuria, frequency and urgency.  Musculoskeletal: Negative.  Negative for back pain, falls, joint pain, myalgias and neck pain.  Skin:  Positive for itching (bilateral upper arms, dry skin x 3 months). Negative for rash.  Neurological: Negative.  Negative for dizziness, tremors, weakness and headaches.  Endo/Heme/Allergies: Negative.  Negative for environmental allergies and polydipsia. Does not bruise/bleed easily.  Psychiatric/Behavioral: Negative.  Negative for depression, memory loss and substance abuse. The patient is not nervous/anxious and does not have insomnia.     Current Outpatient Medications:    blood glucose meter kit and supplies KIT, Dispense based on patient and insurance preference. Use up to four times daily as directed., Disp: 1 each, Rfl: 0   Dulaglutide (TRULICITY) 9.47 SJ/6.2EZ SOPN, Inject 0.75 mg into the skin once a week., Disp: 2 mL, Rfl: 2   fluticasone (FLONASE) 50 MCG/ACT nasal spray, Place 2 sprays into both nostrils daily., Disp: 16 g, Rfl: 0   metoprolol tartrate (LOPRESSOR) 25 MG tablet, Take 1 tablet (25 mg total) by mouth daily., Disp: 90 tablet, Rfl: 1   atorvastatin (LIPITOR) 80 MG tablet, Take 1 tablet (80 mg total) by mouth daily at 6 PM., Disp: 90 tablet, Rfl: 1   clopidogrel (PLAVIX) 75 MG tablet, Take 1  tablet (75 mg total) by mouth daily., Disp: 90 tablet, Rfl: 1   loratadine (CLARITIN) 10 MG tablet, Take 1 tablet (10 mg total) by mouth daily., Disp: 30 tablet, Rfl: 0   losartan (COZAAR) 100 MG tablet, Take 1 tablet (100 mg total) by mouth daily., Disp: 90 tablet, Rfl: 1   metFORMIN (GLUCOPHAGE-XR) 500 MG 24 hr tablet, Take 2 tablets (1,000 mg total) by mouth 2 (two) times daily with a meal., Disp: 180 tablet, Rfl: 1  Allergies  Allergen Reactions   Lisinopril Cough   Penicillins Itching and Rash    Has patient had a PCN reaction causing immediate rash, facial/tongue/throat swelling, SOB or lightheadedness with hypotension: Yes Has  patient had a PCN reaction causing severe rash involving mucus membranes or skin necrosis: Yes Has patient had a PCN reaction that required hospitalization Yes Has patient had a PCN reaction occurring within the last 10 years: No If all of the above answers are "NO", then may proceed with Cephalosporin use.     Past Medical History:  Diagnosis Date   Depression    Diabetes mellitus without complication ([place])    Hyperlipidemia    Hypertension    Stroke [facility])     Past Surgical History:  Procedure Laterality Date   COLONOSCOPY N/A 05/26/2015   Procedure: COLONOSCOPY;  Surgeon: Danie Binder, MD;  [facility];  Service: Endoscopy;  Laterality: N/A;  2:00   CORONARY ARTERY BYPASS GRAFT N/A 12/10/2016   Procedure: CORONARY ARTERY BYPASS GRAFTING (CABG) times three using the left internal mammary artery and the right greater saphenous vein;  Surgeon: Gaye Pollack, MD;  Location: [place];  Service: Open Heart Surgery;  Laterality: N/A;   LEFT HEART CATH AND CORONARY ANGIOGRAPHY N/A 12/10/2016   Procedure: Left Heart Cath and Coronary Angiography;  Surgeon: Troy Sine, MD;  Location: [place] [facility];  Service: Cardiovascular;  Laterality: N/A;   TEE WITHOUT CARDIOVERSION N/A 12/10/2016   Procedure: TRANSESOPHAGEAL ECHOCARDIOGRAM (TEE);  Surgeon: Gaye Pollack, MD;  Location: [place];  Service: Open Heart Surgery;  Laterality: N/A;   TUBAL LIGATION      Family History  Problem Relation Age of Onset   Diabetes Father    Heart disease Mother    Hyperlipidemia Sister    Hypertension Sister    Mental illness Brother     Social History   Socioeconomic History   Marital status: Legally Separated    Spouse name: Not on file   Number of children: Not on file   Years of education: Not on file   Highest education level: Not on file  Occupational History   Not on file  Tobacco Use   Smoking status: Former    Packs/day: 1.00    Years: 30.00    Pack years: 30.00    Types:  Cigarettes    Quit date: 12/10/2016    Years since quitting: 4.6   Smokeless tobacco: Never  Vaping Use   Vaping Use: Never used  Substance and Sexual Activity   Alcohol use: Not Currently    Comment: rarely   Drug use: No   Sexual activity: Not Currently    Birth control/protection: Surgical  Other Topics Concern   Not on file  Social History Narrative   Not on file   Social Determinants of Health   Financial Resource Strain: Not on file  Food Insecurity: Not on file  Transportation Needs: Not on file  Physical Activity: Not on file  Stress: Not on file  Social Connections: Not on file  Intimate Partner Violence: Not on file      Objective:    BP 135/77   Pulse 63   Temp (!) 97.2 F (36.2 C) (Temporal)   Ht _0  (1.575 m)   Wt 84.3 kg   LMP 08/03/2013   SpO2 97%   BMI 33.98 kg/m   Wt Readings from Last 3 Encounters:  07/26/21 185 lb 12.8 oz (84.3 kg)  04/24/21 185 lb 6.4 oz (84.1 kg)  02/05/21 181 lb (82.1 kg)    Physical Exam Vitals reviewed. Exam conducted with a chaperone present.  Constitutional:      General: She is not in acute distress.    Appearance: Normal appearance. She is obese. She is not ill-appearing, toxic-appearing or diaphoretic.  HENT:     Head: Normocephalic and atraumatic.     Right Ear: Tympanic membrane, ear canal and external ear normal.     Left Ear: Tympanic membrane, ear canal and external ear normal.     Nose: Nose normal. No congestion.     Mouth/Throat:     Mouth: Mucous membranes are moist.     Pharynx: Oropharynx is clear. No oropharyngeal exudate or posterior oropharyngeal erythema.  Eyes:     Extraocular Movements: Extraocular movements intact.     Conjunctiva/sclera: Conjunctivae normal.     Pupils: Pupils are equal, round, and reactive to light.  Cardiovascular:     Rate and Rhythm: Normal rate and regular rhythm.     Pulses: Normal pulses.     Heart sounds: Normal heart sounds.  Pulmonary:     Effort: Pulmonary  effort is normal. No respiratory distress.     Breath sounds: Normal breath sounds. No wheezing or rhonchi.  Abdominal:     General: Bowel sounds are normal. There is no distension.     Palpations: Abdomen is soft. There is no mass.  Genitourinary:    General: Normal vulva.     Exam position: Lithotomy position.     Labia:        Right: No rash, tenderness, lesion or injury.        Left: No rash, tenderness, lesion or injury.      Urethra: No prolapse or urethral pain.     Vagina: Normal. No signs of injury. No vaginal discharge, erythema, tenderness or bleeding.     Cervix: Discharge (white discharge) present. No friability, erythema or cervical bleeding.     Uterus: Normal.      Adnexa: Right adnexa normal and left adnexa normal.       Right: No mass, tenderness or fullness.         Left: No mass, tenderness or fullness.       Rectum: No external hemorrhoid.  Musculoskeletal:        General: Normal range of motion.     Cervical back: Normal range of motion.  Skin:    General: Skin is warm and dry.     Capillary Refill: Capillary refill takes less than 2 seconds.     Comments: Dry scaled skin on bilateral upper arms, toe nails overgrown and curving  Neurological:     General: No focal deficit present.     Mental Status: She is alert and oriented to person, place, and time.     Motor: No weakness.     Gait: Gait normal.  Psychiatric:        Mood and Affect: Mood normal.  Behavior: Behavior normal.        Thought Content: Thought content normal.        Judgment: Judgment normal.    No results found for: TSH Lab Results  Component Value Date   WBC 7.2 04/24/2021   HGB 14.6 04/24/2021   HCT 41.9 04/24/2021   MCV 86 04/24/2021   PLT 402 04/24/2021   Lab Results  Component Value Date   NA 136 04/24/2021   K 4.7 04/24/2021   CO2 22 04/24/2021   GLUCOSE 230 (H) 04/24/2021   BUN 12 04/24/2021   CREATININE 0.81 04/24/2021   BILITOT 0.5 04/24/2021   ALKPHOS 131 (H)  04/24/2021   AST 12 04/24/2021   ALT 13 04/24/2021   PROT 6.8 04/24/2021   ALBUMIN 4.1 04/24/2021   CALCIUM 9.7 04/24/2021   ANIONGAP 10 12/13/2016   EGFR 85 04/24/2021   Lab Results  Component Value Date   CHOL 161 04/24/2021   Lab Results  Component Value Date   HDL 37 (L) 04/24/2021   Lab Results  Component Value Date   LDLCALC 97 04/24/2021   Lab Results  Component Value Date   TRIG 154 (H) 04/24/2021   Lab Results  Component Value Date   CHOLHDL 4.4 04/24/2021   Lab Results  Component Value Date   HGBA1C 7.6 (H) 04/24/2021

## 2021-07-26 NOTE — Patient Instructions (Signed)
CeraVe Anti-Itch lotion

## 2021-07-27 LAB — LIPID PANEL
Chol/HDL Ratio: 5.4 ratio — ABNORMAL HIGH (ref 0.0–4.4)
Cholesterol, Total: 182 mg/dL (ref 100–199)
HDL: 34 mg/dL — ABNORMAL LOW (ref >39)
LDL Chol Calc (NIH): 95 mg/dL (ref 0–99)
Triglycerides: 316 mg/dL — ABNORMAL HIGH (ref 0–149)
VLDL Cholesterol Cal: 53 mg/dL — ABNORMAL HIGH (ref 5–40)

## 2021-07-27 LAB — CMP14+EGFR
ALT: 13 IU/L (ref 0–32)
AST: 15 IU/L (ref 0–40)
Albumin/Globulin Ratio: 1.7 (ref 1.2–2.2)
Albumin: 4.3 g/dL (ref 3.8–4.9)
Alkaline Phosphatase: 135 IU/L — ABNORMAL HIGH (ref 44–121)
BUN/Creatinine Ratio: 12 (ref 9–23)
BUN: 8 mg/dL (ref 6–24)
Bilirubin Total: 0.2 mg/dL (ref 0.0–1.2)
CO2: 21 mmol/L (ref 20–29)
Calcium: 9.6 mg/dL (ref 8.7–10.2)
Chloride: 100 mmol/L (ref 96–106)
Creatinine, Ser: 0.69 mg/dL (ref 0.57–1.00)
Globulin, Total: 2.6 g/dL (ref 1.5–4.5)
Glucose: 199 mg/dL — ABNORMAL HIGH (ref 65–99)
Potassium: 5 mmol/L (ref 3.5–5.2)
Sodium: 138 mmol/L (ref 134–144)
Total Protein: 6.9 g/dL (ref 6.0–8.5)
eGFR: 102 mL/min/{1.73_m2} (ref >59)

## 2021-07-27 LAB — CBC WITH DIFFERENTIAL/PLATELET
Basophils Absolute: 0.1 10*3/uL (ref 0.0–0.2)
Basos: 1 %
EOS (ABSOLUTE): 0.3 10*3/uL (ref 0.0–0.4)
Eos: 4 %
Hematocrit: 40.2 % (ref 34.0–46.6)
Hemoglobin: 13.6 g/dL (ref 11.1–15.9)
Immature Grans (Abs): 0 10*3/uL (ref 0.0–0.1)
Immature Granulocytes: 0 %
Lymphocytes Absolute: 2.1 10*3/uL (ref 0.7–3.1)
Lymphs: 33 %
MCH: 29.4 pg (ref 26.6–33.0)
MCHC: 33.8 g/dL (ref 31.5–35.7)
MCV: 87 fL (ref 79–97)
Monocytes Absolute: 0.5 10*3/uL (ref 0.1–0.9)
Monocytes: 8 %
Neutrophils Absolute: 3.4 10*3/uL (ref 1.4–7.0)
Neutrophils: 54 %
Platelets: 426 10*3/uL (ref 150–450)
RBC: 4.62 x10E6/uL (ref 3.77–5.28)
RDW: 12.6 % (ref 11.7–15.4)
WBC: 6.4 10*3/uL (ref 3.4–10.8)

## 2021-07-27 LAB — VITAMIN B12: Vitamin B-12: 390 pg/mL (ref 232–1245)

## 2021-07-30 LAB — CYTOLOGY - PAP
Chlamydia: NEGATIVE
Comment: NEGATIVE
Comment: NEGATIVE
Comment: NEGATIVE
Comment: NORMAL
Diagnosis: NEGATIVE
High risk HPV: NEGATIVE
Neisseria Gonorrhea: NEGATIVE
Trichomonas: NEGATIVE

## 2021-08-01 ENCOUNTER — Ambulatory Visit
Admission: RE | Admit: 2021-08-01 | Discharge: 2021-08-01 | Disposition: A | Discharge status: Home / Self Care | Payer: Medicaid Other | Source: Ambulatory Visit | Attending: Family Medicine | Admitting: Family Medicine

## 2021-08-01 ENCOUNTER — Other Ambulatory Visit: Payer: Self-pay

## 2021-08-01 DIAGNOSIS — Z1231 Encounter for screening mammogram for malignant neoplasm of breast: Secondary | ICD-10-CM

## 2021-10-18 ENCOUNTER — Ambulatory Visit: Payer: Medicaid Other | Admitting: Family Medicine

## 2021-11-08 ENCOUNTER — Other Ambulatory Visit: Payer: Self-pay | Admitting: Family Medicine

## 2021-11-08 DIAGNOSIS — E1165 Type 2 diabetes mellitus with hyperglycemia: Secondary | ICD-10-CM

## 2021-12-06 ENCOUNTER — Encounter: Payer: Self-pay | Admitting: Family Medicine

## 2021-12-06 ENCOUNTER — Other Ambulatory Visit: Payer: Self-pay | Admitting: Family Medicine

## 2021-12-06 DIAGNOSIS — E1165 Type 2 diabetes mellitus with hyperglycemia: Secondary | ICD-10-CM

## 2021-12-06 NOTE — Telephone Encounter (Signed)
Cindy Garrett. NTBS 30 days given 11/10/21

## 2021-12-06 NOTE — Telephone Encounter (Signed)
Number on file not working. Letter mailed

## 2021-12-14 ENCOUNTER — Telehealth: Payer: Self-pay | Admitting: Family Medicine

## 2021-12-14 NOTE — Telephone Encounter (Signed)
Called patient to schedule follow up for diabetes

## 2022-01-04 ENCOUNTER — Encounter: Payer: Self-pay | Admitting: Family Medicine

## 2022-01-04 ENCOUNTER — Ambulatory Visit (INDEPENDENT_AMBULATORY_CARE_PROVIDER_SITE_OTHER): Payer: Medicare Other | Admitting: Family Medicine

## 2022-01-04 VITALS — BP 165/95 | HR 73 | Temp 97.9°F | Ht 62.0 in | Wt 183.6 lb

## 2022-01-04 DIAGNOSIS — L602 Onychogryphosis: Secondary | ICD-10-CM | POA: Diagnosis not present

## 2022-01-04 DIAGNOSIS — E1165 Type 2 diabetes mellitus with hyperglycemia: Secondary | ICD-10-CM | POA: Diagnosis not present

## 2022-01-04 DIAGNOSIS — I10 Essential (primary) hypertension: Secondary | ICD-10-CM | POA: Diagnosis not present

## 2022-01-04 DIAGNOSIS — Z8673 Personal history of transient ischemic attack (TIA), and cerebral infarction without residual deficits: Secondary | ICD-10-CM | POA: Diagnosis not present

## 2022-01-04 DIAGNOSIS — I25118 Atherosclerotic heart disease of native coronary artery with other forms of angina pectoris: Secondary | ICD-10-CM | POA: Diagnosis not present

## 2022-01-04 DIAGNOSIS — E785 Hyperlipidemia, unspecified: Secondary | ICD-10-CM

## 2022-01-04 LAB — LIPID PANEL

## 2022-01-04 LAB — BAYER DCA HB A1C WAIVED: HB A1C (BAYER DCA - WAIVED): 7.1 % — ABNORMAL HIGH (ref 4.8–5.6)

## 2022-01-04 MED ORDER — CLOPIDOGREL BISULFATE 75 MG PO TABS: 75.0000 mg | ORAL_TABLET | Freq: Every day | ORAL | 1 refills | Status: DC

## 2022-01-04 MED ORDER — METFORMIN HCL ER 500 MG PO TB24: 1000.0000 mg | ORAL_TABLET | Freq: Two times a day (BID) | ORAL | 1 refills | Status: DC

## 2022-01-04 MED ORDER — ATORVASTATIN CALCIUM 80 MG PO TABS: 80.0000 mg | ORAL_TABLET | Freq: Every day | ORAL | 1 refills | Status: DC

## 2022-01-04 MED ORDER — LOSARTAN POTASSIUM 100 MG PO TABS: 100.0000 mg | ORAL_TABLET | Freq: Every day | ORAL | 1 refills | Status: DC

## 2022-01-04 MED ORDER — METOPROLOL TARTRATE 25 MG PO TABS: 25.0000 mg | ORAL_TABLET | Freq: Every day | ORAL | 1 refills | Status: DC

## 2022-01-04 MED ORDER — TRULICITY 1.5 MG/0.5ML ~~LOC~~ SOAJ: 1.5000 mg | SUBCUTANEOUS | 2 refills | Status: DC

## 2022-01-04 NOTE — Patient Instructions (Signed)
[facility]  ?[address], [place] [address] ?Phone: [phone] ?

## 2022-01-04 NOTE — Progress Notes (Signed)
Assessment & Plan:  1. Type 2 diabetes mellitus with hyperglycemia, without long-term current use of insulin (HCC) Lab Results  Component Value Date   HGBA1C 7.1 (H) 01/04/2022   HGBA1C 8.0 (H) 07/26/2021   HGBA1C 7.6 (H) 04/24/2021    - Diabetes is not at goal of A1c < 7. - Medications: continue current medications with an increase in Trulicity from 3.41 mg to 1.5 mg weekly. - Patient is currently taking a statin. Patient is taking an ACE-inhibitor/ARB.  - Instruction/counseling given: discussed foot care  Diabetes Health Maintenance Due  Topic Date Due   OPHTHALMOLOGY EXAM  Never done   HEMOGLOBIN A1C  07/07/2022   FOOT EXAM  01/05/2023    Lab Results  Component Value Date   LABMICR 89.3 01/04/2022   LABMICR 174.3 01/20/2020   - Lipid panel - CBC with Differential/Platelet - CMP14+EGFR - Bayer DCA Hb A1c Waived - metFORMIN (GLUCOPHAGE-XR) 500 MG 24 hr tablet; Take 2 tablets (1,000 mg total) by mouth 2 (two) times daily with a meal.  Dispense: 180 tablet; Refill: 1 - atorvastatin (LIPITOR) 80 MG tablet; Take 1 tablet (80 mg total) by mouth daily at 6 PM.  Dispense: 90 tablet; Refill: 1 - Dulaglutide (TRULICITY) 1.5 DQ/2.2WL SOPN; Inject 1.5 mg into the skin once a week.  Dispense: 2 mL; Refill: 2 - Microalbumin / creatinine urine ratio  2. Essential (primary) hypertension Patient to monitor BP at home and bring in a log with her next time she comes. Also encouraged to bring home monitor so we can compare it to ours.  - Lipid panel - CBC with Differential/Platelet - CMP14+EGFR - losartan (COZAAR) 100 MG tablet; Take 1 tablet (100 mg total) by mouth daily.  Dispense: 90 tablet; Refill: 1 - metoprolol tartrate (LOPRESSOR) 25 MG tablet; Take 1 tablet (25 mg total) by mouth daily.  Dispense: 90 tablet; Refill: 1  3. Dyslipidemia Well controlled on current regimen.  - Lipid panel - CBC with Differential/Platelet - CMP14+EGFR - atorvastatin (LIPITOR) 80 MG tablet; Take 1  tablet (80 mg total) by mouth daily at 6 PM.  Dispense: 90 tablet; Refill: 1  4. 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 - Lipid panel - atorvastatin (LIPITOR) 80 MG tablet; Take 1 tablet (80 mg total) by mouth daily at 6 PM.  Dispense: 90 tablet; Refill: 1  5. History of CVA (cerebrovascular accident) Continue Atorvastatin and Plavix. - clopidogrel (PLAVIX) 75 MG tablet; Take 1 tablet (75 mg total) by mouth daily.  Dispense: 90 tablet; Refill: 1  6. Overgrown toenails Name, address, and phone # provided to podiatry referral so patient can call and schedule an appointment.   Return in about 3 months (around 04/06/2022) for follow-up of chronic medication conditions.  Hendricks Limes, MSN, APRN, FNP-C [facility]  Subjective:    Patient ID: Cindy Garrett, female    DOB: 05-25-1966, 56 y.o.   MRN: [id number]  Patient Care Team: Loman Brooklyn, FNP as PCP - General (Family Medicine) Harl Bowie Alphonse Guild, MD as PCP - Cardiology (Cardiology) Harlen Labs, MD as Referring Physician (Optometry)   Chief Complaint:  Chief Complaint  Patient presents with   Medical Management of Chronic Issues    HPI: Cindy Garrett is a 56 y.o. female presenting on 01/04/2022 for Medical Management of Chronic Issues  Diabetes: Patient presents for follow up of diabetes. Current symptoms include: hyperglycemia. Known diabetic  complications: cerebrovascular disease. Medication compliance: taking metformin 1,000 mg twice daily and Trulicity 5.63 mg weekly. Current diet: in general, a "healthy" diet  , eating better; using an air fryer . Current exercise: walking. Home blood sugar records: patient does not check sugars. Is she  on ACE inhibitor or angiotensin II receptor blocker? Yes (Losartan). Is she on a statin? Yes (Atorvastatin).   Hypertension: patient does monitor her blood  pressure at home, but cannot tell me her numbers.  New complaints: None   Social history:  Relevant past medical, surgical, family and social history reviewed and updated as indicated. Interim medical history since our last visit reviewed.  Allergies and medications reviewed and updated.  DATA REVIEWED: CHART IN EPIC  ROS: Negative unless specifically indicated above in HPI.    Current Outpatient Medications:    atorvastatin (LIPITOR) 80 MG tablet, Take 1 tablet (80 mg total) by mouth daily at 6 PM., Disp: 90 tablet, Rfl: 1   blood glucose meter kit and supplies KIT, Dispense based on patient and insurance preference. Use up to four times daily as directed., Disp: 1 each, Rfl: 0   clopidogrel (PLAVIX) 75 MG tablet, Take 1 tablet (75 mg total) by mouth daily., Disp: 90 tablet, Rfl: 1   Dulaglutide (TRULICITY) 8.75 IE/3.3IR SOPN, Inject 0.75 mg into the skin once a week. (NEEDS TO BE SEEN BEFORE NEXT REFILL), Disp: 2 mL, Rfl: 0   fluticasone (FLONASE) 50 MCG/ACT nasal spray, Place 2 sprays into both nostrils daily., Disp: 16 g, Rfl: 0   loratadine (CLARITIN) 10 MG tablet, Take 1 tablet (10 mg total) by mouth daily., Disp: 30 tablet, Rfl: 0   losartan (COZAAR) 100 MG tablet, Take 1 tablet (100 mg total) by mouth daily., Disp: 90 tablet, Rfl: 1   metFORMIN (GLUCOPHAGE-XR) 500 MG 24 hr tablet, Take 2 tablets (1,000 mg total) by mouth 2 (two) times daily with a meal., Disp: 180 tablet, Rfl: 1   metoprolol tartrate (LOPRESSOR) 25 MG tablet, Take 1 tablet (25 mg total) by mouth daily., Disp: 90 tablet, Rfl: 1   Allergies  Allergen Reactions   Lisinopril Cough   Penicillins Itching and Rash    Has patient had a PCN reaction causing immediate rash, facial/tongue/throat swelling, SOB or lightheadedness with hypotension: Yes Has patient had a PCN reaction causing severe rash involving mucus membranes or skin necrosis: Yes Has patient had a PCN reaction that required hospitalization Yes Has  patient had a PCN reaction occurring within the last 10 years: No If all of the above answers are "NO", then may proceed with Cephalosporin use.    Past Medical History:  Diagnosis Date   Depression    Diabetes mellitus without complication ([place])    Hyperlipidemia    Hypertension    Stroke [facility])     Past Surgical History:  Procedure Laterality Date   COLONOSCOPY N/A 05/26/2015   Procedure: COLONOSCOPY;  Surgeon: Danie Binder, MD;  [facility];  Service: Endoscopy;  Laterality: N/A;  2:00   CORONARY ARTERY BYPASS GRAFT N/A 12/10/2016   Procedure: CORONARY ARTERY BYPASS GRAFTING (CABG) times three using the left internal mammary artery and the right greater saphenous vein;  Surgeon: Gaye Pollack, MD;  Location: [place];  Service: Open Heart Surgery;  Laterality: N/A;   LEFT HEART CATH AND CORONARY ANGIOGRAPHY N/A 12/10/2016   Procedure: Left Heart Cath and Coronary Angiography;  Surgeon: Troy Sine, MD;  Location: [place] [facility];  Service: Cardiovascular;  Laterality: N/A;   TEE WITHOUT CARDIOVERSION N/A 12/10/2016   Procedure: TRANSESOPHAGEAL ECHOCARDIOGRAM (TEE);  Surgeon: Gaye Pollack, MD;  Location: [place];  Service: Open Heart Surgery;  Laterality: N/A;   TUBAL LIGATION      Social History   Socioeconomic History   Marital status: Legally Separated    Spouse name: Not on file   Number of children: Not on file   Years of education: Not on file   Highest education level: Not on file  Occupational History   Not on file  Tobacco Use   Smoking status: Former    Packs/day: 1.00    Years: 30.00    Pack years: 30.00    Types: Cigarettes    Quit date: 12/10/2016    Years since quitting: 5.0   Smokeless tobacco: Never  Vaping Use   Vaping Use: Never used  Substance and Sexual Activity   Alcohol use: Not Currently    Comment: rarely   Drug use: No   Sexual activity: Not Currently    Birth control/protection: Surgical  Other Topics Concern   Not on file   Social History Narrative   Not on file   Social Determinants of Health   Financial Resource Strain: Not on file  Food Insecurity: Not on file  Transportation Needs: Not on file  Physical Activity: Not on file  Stress: Not on file  Social Connections: Not on file  Intimate Partner Violence: Not on file        Objective:    BP (!) 176/87    Pulse 73    Temp 97.9 F (36.6 C) (Temporal)    Ht '5\' 2"'  (1.575 m)    Wt 183 lb 9.6 oz (83.3 kg)    LMP 08/03/2013    SpO2 100%    BMI 33.58 kg/m   Wt Readings from Last 3 Encounters:  01/04/22 183 lb 9.6 oz (83.3 kg)  07/26/21 185 lb 12.8 oz (84.3 kg)  04/24/21 185 lb 6.4 oz (84.1 kg)    Physical Exam Vitals reviewed.  Constitutional:      General: She is not in acute distress.    Appearance: Normal appearance. She is obese. She is not ill-appearing, toxic-appearing or diaphoretic.  HENT:     Head: Normocephalic and atraumatic.  Eyes:     General: No scleral icterus.       Right eye: No discharge.        Left eye: No discharge.     Conjunctiva/sclera: Conjunctivae normal.  Cardiovascular:     Rate and Rhythm: Normal rate and regular rhythm.     Heart sounds: Normal heart sounds. No murmur heard.   No friction rub. No gallop.  Pulmonary:     Effort: Pulmonary effort is normal. No respiratory distress.     Breath sounds: Normal breath sounds. No stridor. No wheezing, rhonchi or rales.  Musculoskeletal:        General: Normal range of motion.     Cervical back: Normal range of motion.  Skin:    General: Skin is warm and dry.     Capillary Refill: Capillary refill takes less than 2 seconds.  Neurological:     General: No focal deficit present.     Mental Status: She is alert and oriented to person, place, and time. Mental status is at baseline.  Psychiatric:        Mood and Affect: Mood normal.        Behavior: Behavior normal.  Thought Content: Thought content normal.        Judgment: Judgment normal.   Diabetic  Foot Exam - Simple   Simple Foot Form Diabetic Foot exam was performed with the following findings: Yes 01/04/2022  4:04 PM  Visual Inspection No deformities, no ulcerations, no other skin breakdown bilaterally: Yes Sensation Testing Intact to touch and monofilament testing bilaterally: Yes Pulse Check Posterior Tibialis and Dorsalis pulse intact bilaterally: Yes Comments Very long thick toenails.     No results found for: TSH Lab Results  Component Value Date   WBC 6.4 07/26/2021   HGB 13.6 07/26/2021   HCT 40.2 07/26/2021   MCV 87 07/26/2021   PLT 426 07/26/2021   Lab Results  Component Value Date   NA 138 07/26/2021   K 5.0 07/26/2021   CO2 21 07/26/2021   GLUCOSE 199 (H) 07/26/2021   BUN 8 07/26/2021   CREATININE 0.69 07/26/2021   BILITOT 0.2 07/26/2021   ALKPHOS 135 (H) 07/26/2021   AST 15 07/26/2021   ALT 13 07/26/2021   PROT 6.9 07/26/2021   ALBUMIN 4.3 07/26/2021   CALCIUM 9.6 07/26/2021   ANIONGAP 10 12/13/2016   EGFR 102 07/26/2021   Lab Results  Component Value Date   CHOL 182 07/26/2021   Lab Results  Component Value Date   HDL 34 (L) 07/26/2021   Lab Results  Component Value Date   LDLCALC 95 07/26/2021   Lab Results  Component Value Date   TRIG 316 (H) 07/26/2021   Lab Results  Component Value Date   CHOLHDL 5.4 (H) 07/26/2021   Lab Results  Component Value Date   HGBA1C 8.0 (H) 07/26/2021

## 2022-01-05 ENCOUNTER — Other Ambulatory Visit: Payer: Self-pay | Admitting: Family Medicine

## 2022-01-05 DIAGNOSIS — E1165 Type 2 diabetes mellitus with hyperglycemia: Secondary | ICD-10-CM

## 2022-01-05 LAB — CBC WITH DIFFERENTIAL/PLATELET
Basophils Absolute: 0 10*3/uL (ref 0.0–0.2)
Basos: 1 %
EOS (ABSOLUTE): 0.6 10*3/uL — ABNORMAL HIGH (ref 0.0–0.4)
Eos: 9 %
Hematocrit: 40.2 % (ref 34.0–46.6)
Hemoglobin: 13.5 g/dL (ref 11.1–15.9)
Immature Grans (Abs): 0 10*3/uL (ref 0.0–0.1)
Immature Granulocytes: 0 %
Lymphocytes Absolute: 2 10*3/uL (ref 0.7–3.1)
Lymphs: 33 %
MCH: 29.1 pg (ref 26.6–33.0)
MCHC: 33.6 g/dL (ref 31.5–35.7)
MCV: 87 fL (ref 79–97)
Monocytes Absolute: 0.6 10*3/uL (ref 0.1–0.9)
Monocytes: 10 %
Neutrophils Absolute: 2.8 10*3/uL (ref 1.4–7.0)
Neutrophils: 47 %
Platelets: 405 10*3/uL (ref 150–450)
RBC: 4.64 x10E6/uL (ref 3.77–5.28)
RDW: 12.7 % (ref 11.7–15.4)
WBC: 6 10*3/uL (ref 3.4–10.8)

## 2022-01-05 LAB — CMP14+EGFR
ALT: 13 IU/L (ref 0–32)
AST: 15 IU/L (ref 0–40)
Albumin/Globulin Ratio: 1.7 (ref 1.2–2.2)
Albumin: 4.5 g/dL (ref 3.8–4.9)
Alkaline Phosphatase: 126 IU/L — ABNORMAL HIGH (ref 44–121)
BUN/Creatinine Ratio: 12 (ref 9–23)
BUN: 9 mg/dL (ref 6–24)
Bilirubin Total: <0.2 mg/dL (ref 0.0–1.2)
CO2: 26 mmol/L (ref 20–29)
Calcium: 10.1 mg/dL (ref 8.7–10.2)
Chloride: 101 mmol/L (ref 96–106)
Creatinine, Ser: 0.74 mg/dL (ref 0.57–1.00)
Globulin, Total: 2.7 g/dL (ref 1.5–4.5)
Glucose: 137 mg/dL — ABNORMAL HIGH (ref 70–99)
Potassium: 4.3 mmol/L (ref 3.5–5.2)
Sodium: 141 mmol/L (ref 134–144)
Total Protein: 7.2 g/dL (ref 6.0–8.5)
eGFR: 95 mL/min/{1.73_m2} (ref >59)

## 2022-01-05 LAB — LIPID PANEL
Chol/HDL Ratio: 4.6 ratio — ABNORMAL HIGH (ref 0.0–4.4)
Cholesterol, Total: 166 mg/dL (ref 100–199)
HDL: 36 mg/dL — ABNORMAL LOW (ref >39)
LDL Chol Calc (NIH): 87 mg/dL (ref 0–99)
Triglycerides: 257 mg/dL — ABNORMAL HIGH (ref 0–149)
VLDL Cholesterol Cal: 43 mg/dL — ABNORMAL HIGH (ref 5–40)

## 2022-01-06 LAB — MICROALBUMIN / CREATININE URINE RATIO
Creatinine, Urine: 135.9 mg/dL
Microalb/Creat Ratio: 66 mg/g creat — ABNORMAL HIGH (ref 0–29)
Microalbumin, Urine: 89.3 ug/mL

## 2022-01-08 ENCOUNTER — Encounter: Payer: Self-pay | Admitting: Family Medicine

## 2022-01-08 ENCOUNTER — Other Ambulatory Visit: Payer: Self-pay | Admitting: Family Medicine

## 2022-01-08 DIAGNOSIS — E1165 Type 2 diabetes mellitus with hyperglycemia: Secondary | ICD-10-CM

## 2022-01-23 ENCOUNTER — Ambulatory Visit (INDEPENDENT_AMBULATORY_CARE_PROVIDER_SITE_OTHER): Payer: Medicare Other | Admitting: Podiatry

## 2022-01-23 ENCOUNTER — Other Ambulatory Visit: Payer: Self-pay

## 2022-01-23 DIAGNOSIS — E0843 Diabetes mellitus due to underlying condition with diabetic autonomic (poly)neuropathy: Secondary | ICD-10-CM | POA: Diagnosis not present

## 2022-01-23 DIAGNOSIS — B351 Tinea unguium: Secondary | ICD-10-CM

## 2022-01-23 DIAGNOSIS — M79674 Pain in right toe(s): Secondary | ICD-10-CM | POA: Diagnosis not present

## 2022-01-23 DIAGNOSIS — M79675 Pain in left toe(s): Secondary | ICD-10-CM | POA: Diagnosis not present

## 2022-01-23 NOTE — Progress Notes (Signed)
? ?  SUBJECTIVE ?Patient with a history of diabetes mellitus presents to office today complaining of elongated, thickened nails that cause pain while ambulating in shoes.  Patient is unable to trim their own nails. Patient is here for further evaluation and treatment. ? ? ?Past Medical History:  ?Diagnosis Date  ? Depression   ? Diabetes mellitus without complication (HCC)   ? Hyperlipidemia   ? Hypertension   ? Stroke [facility])   ? ? ?OBJECTIVE ?General Patient is awake, alert, and oriented x 3 and in no acute distress. ?Derm Skin is dry and supple bilateral. Negative open lesions or macerations. Remaining integument unremarkable. Nails are tender, long, thickened and dystrophic with subungual debris, consistent with onychomycosis, 1-5 bilateral. No signs of infection noted. ?Vasc  DP and PT pedal pulses palpable bilaterally. Temperature gradient within normal limits.  ?Neuro Epicritic and protective threshold sensation diminished bilaterally.  ?Musculoskeletal Exam No symptomatic pedal deformities noted bilateral. Muscular strength within normal limits. ? ?ASSESSMENT ?1. Diabetes Mellitus w/ peripheral neuropathy ?2.  Pain due to onychomycosis of toenails bilateral ? ?PLAN OF CARE ?1. Patient evaluated today.  Comprehensive diabetic foot exam performed today ?2. Instructed to maintain good pedal hygiene and foot care. Stressed importance of controlling blood sugar.  ?3. Mechanical debridement of nails 1-5 bilaterally performed using a nail nipper. Filed with dremel without incident.  ?4. Return to clinic in 3 mos.  ? ? ? ?Felecia Shelling, DPM ?[facility] ? ?Dr. Felecia Shelling, DPM  ?  ?[address]                                      ?[place], [place] [address]                ?Office [phone]  ?Fax [phone] ? ? ? ? ? ?

## 2022-02-03 ENCOUNTER — Other Ambulatory Visit: Payer: Self-pay | Admitting: Family Medicine

## 2022-02-07 ENCOUNTER — Other Ambulatory Visit: Payer: Self-pay | Admitting: Family Medicine

## 2022-02-08 LAB — HM DIABETES EYE EXAM

## 2022-02-14 ENCOUNTER — Other Ambulatory Visit: Payer: Self-pay | Admitting: Family Medicine

## 2022-02-15 ENCOUNTER — Other Ambulatory Visit: Payer: Self-pay | Admitting: *Deleted

## 2022-02-15 DIAGNOSIS — I10 Essential (primary) hypertension: Secondary | ICD-10-CM

## 2022-03-28 DIAGNOSIS — L309 Dermatitis, unspecified: Secondary | ICD-10-CM | POA: Diagnosis not present

## 2022-04-09 ENCOUNTER — Ambulatory Visit (INDEPENDENT_AMBULATORY_CARE_PROVIDER_SITE_OTHER): Payer: Medicare Other | Admitting: Family Medicine

## 2022-04-09 ENCOUNTER — Encounter: Payer: Self-pay | Admitting: Family Medicine

## 2022-04-09 VITALS — BP 145/79 | HR 74 | Temp 97.2°F | Resp 20 | Ht 62.0 in | Wt 175.0 lb

## 2022-04-09 DIAGNOSIS — E785 Hyperlipidemia, unspecified: Secondary | ICD-10-CM | POA: Diagnosis not present

## 2022-04-09 DIAGNOSIS — Z8673 Personal history of transient ischemic attack (TIA), and cerebral infarction without residual deficits: Secondary | ICD-10-CM

## 2022-04-09 DIAGNOSIS — E1165 Type 2 diabetes mellitus with hyperglycemia: Secondary | ICD-10-CM

## 2022-04-09 DIAGNOSIS — I10 Essential (primary) hypertension: Secondary | ICD-10-CM

## 2022-04-09 DIAGNOSIS — I25118 Atherosclerotic heart disease of native coronary artery with other forms of angina pectoris: Secondary | ICD-10-CM

## 2022-04-09 LAB — BAYER DCA HB A1C WAIVED: HB A1C (BAYER DCA - WAIVED): 5.7 % — ABNORMAL HIGH (ref 4.8–5.6)

## 2022-04-09 MED ORDER — TRULICITY 3 MG/0.5ML ~~LOC~~ SOAJ: 3.0000 mg | SUBCUTANEOUS | 2 refills | Status: DC

## 2022-04-09 MED ORDER — METFORMIN HCL ER 500 MG PO TB24: 500.0000 mg | ORAL_TABLET | Freq: Two times a day (BID) | ORAL | 1 refills | Status: DC

## 2022-04-09 NOTE — Progress Notes (Signed)
Assessment & Plan:  1. Type 2 diabetes mellitus with hyperglycemia, without long-term current use of insulin (HCC) A1c 5.7 today which is decreased from 7.1 three months ago. - Diabetes is at goal of A1c < 7. - Medications:  Increase Trulicity from 1.5 mg to 3 mg weekly and decrease metformin from 1,000 mg twice daily to 500 mg twice daily. - Home glucose monitoring: Continue monitoring - Patient is currently taking a statin. Patient is taking an ACE-inhibitor/ARB.  - Instruction/counseling given: discussed the need for weight loss  Diabetes Health Maintenance Due  Topic Date Due   OPHTHALMOLOGY EXAM  Never done   HEMOGLOBIN A1C  07/07/2022   FOOT EXAM  01/24/2023    Lab Results  Component Value Date   LABMICR 89.3 01/04/2022   LABMICR 174.3 01/20/2020   - CBC with Differential/Platelet - CMP14+EGFR - Lipid panel - Bayer DCA Hb A1c Waived - Dulaglutide (TRULICITY) 3 GL/8.7FI SOPN; Inject 3 mg as directed once a week.  Dispense: 2 mL; Refill: 2 - metFORMIN (GLUCOPHAGE-XR) 500 MG 24 hr tablet; Take 1 tablet (500 mg total) by mouth 2 (two) times daily with a meal.  Dispense: 180 tablet; Refill: 1  2. Essential (primary) hypertension Uncontrolled.  Patient is unwilling to make a change to her medication today as she feels her blood pressure is elevated due to circumstances at home.  Education provided on the DASH diet.  Encourage patient to monitor blood pressure at home and bring a log to her next visit. - CBC with Differential/Platelet - CMP14+EGFR - Lipid panel  3. Dyslipidemia - CBC with Differential/Platelet - CMP14+EGFR - Lipid panel  4. 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 - CBC with Differential/Platelet - CMP14+EGFR - Lipid panel  5. History of CVA (cerebrovascular accident) Continue Plavix and atorvastatin. - CMP14+EGFR - Lipid  panel   Return in about 3 months (around 07/10/2022) for follow-up of chronic medication conditions.  Hendricks Limes, MSN, APRN, FNP-C [facility]  Subjective:    Patient ID: Cindy Garrett, female    DOB: [birth year], 56 y.o.   MRN: [id number]  Patient Care Team: Loman Brooklyn, FNP as PCP - General (Family Medicine) Harl Bowie Alphonse Guild, MD as PCP - Cardiology (Cardiology) Harlen Labs, MD as Referring Physician (Optometry)   Chief Complaint:  Chief Complaint  Patient presents with   Medical Management of Chronic Issues    HPI: Cindy Garrett is a 56 y.o. female presenting on 04/09/2022 for Medical Management of Chronic Issues  Diabetes with hypertension and hyperlipidemia: Patient presents for follow up of diabetes. Current symptoms include: hyperglycemia. Known diabetic complications: cerebrovascular disease. Medication compliance: taking metformin 1,000 mg twice daily and Trulicity 1.5 mg weekly. Current diet: in general, a "healthy" diet  , eating better; using an air fryer . Current exercise: walking. Home blood sugar records: BGs consistently in an acceptable range. Is she  on ACE inhibitor or angiotensin II receptor blocker? Yes (Losartan). Is she on a statin? Yes (Atorvastatin).   Hypertension: patient does monitor her blood pressure at home, but cannot tell me her numbers.  New complaints: None    Social history:  Relevant past medical, surgical, family and social history reviewed and updated as indicated. Interim medical history since our last visit reviewed.  Allergies and medications reviewed and updated.  DATA REVIEWED: CHART IN EPIC  ROS: Negative unless specifically indicated  above in HPI.    Current Outpatient Medications:    atorvastatin (LIPITOR) 80 MG tablet, Take 1 tablet (80 mg total) by mouth daily at 6 PM., Disp: 90 tablet, Rfl: 1   clopidogrel (PLAVIX) 75 MG tablet, Take 1 tablet (75 mg total) by mouth daily., Disp: 90  tablet, Rfl: 1   Dulaglutide (TRULICITY) 1.5 KC/1.2XN SOPN, Inject 1.5 mg into the skin once a week., Disp: 2 mL, Rfl: 2   fluticasone (FLONASE) 50 MCG/ACT nasal spray, Place 2 sprays into both nostrils daily., Disp: 16 g, Rfl: 0   loratadine (CLARITIN) 10 MG tablet, Take 1 tablet (10 mg total) by mouth daily., Disp: 30 tablet, Rfl: 0   losartan (COZAAR) 100 MG tablet, Take 1 tablet (100 mg total) by mouth daily., Disp: 90 tablet, Rfl: 1   metFORMIN (GLUCOPHAGE-XR) 500 MG 24 hr tablet, Take 2 tablets (1,000 mg total) by mouth 2 (two) times daily with a meal., Disp: 180 tablet, Rfl: 1   metoprolol tartrate (LOPRESSOR) 25 MG tablet, Take 1 tablet (25 mg total) by mouth daily., Disp: 90 tablet, Rfl: 1   Allergies  Allergen Reactions   Lisinopril Cough   Penicillins Itching and Rash    Has patient had a PCN reaction causing immediate rash, facial/tongue/throat swelling, SOB or lightheadedness with hypotension: Yes Has patient had a PCN reaction causing severe rash involving mucus membranes or skin necrosis: Yes Has patient had a PCN reaction that required hospitalization Yes Has patient had a PCN reaction occurring within the last 10 years: No If all of the above answers are "NO", then may proceed with Cephalosporin use.    Past Medical History:  Diagnosis Date   Depression    Diabetes mellitus without complication ([place])    Hyperlipidemia    Hypertension    Stroke [facility])     Past Surgical History:  Procedure Laterality Date   COLONOSCOPY N/A 05/26/2015   Procedure: COLONOSCOPY;  Surgeon: Danie Binder, MD;  [facility];  Service: Endoscopy;  Laterality: N/A;  2:00   CORONARY ARTERY BYPASS GRAFT N/A 12/10/2016   Procedure: CORONARY ARTERY BYPASS GRAFTING (CABG) times three using the left internal mammary artery and the right greater saphenous vein;  Surgeon: Gaye Pollack, MD;  Location: [place];  Service: Open Heart Surgery;  Laterality: N/A;   LEFT HEART CATH AND CORONARY  ANGIOGRAPHY N/A 12/10/2016   Procedure: Left Heart Cath and Coronary Angiography;  Surgeon: Troy Sine, MD;  Location: [place] [facility];  Service: Cardiovascular;  Laterality: N/A;   TEE WITHOUT CARDIOVERSION N/A 12/10/2016   Procedure: TRANSESOPHAGEAL ECHOCARDIOGRAM (TEE);  Surgeon: Gaye Pollack, MD;  Location: [place];  Service: Open Heart Surgery;  Laterality: N/A;   TUBAL LIGATION      Social History   Socioeconomic History   Marital status: Legally Separated    Spouse name: Not on file   Number of children: Not on file   Years of education: Not on file   Highest education level: Not on file  Occupational History   Not on file  Tobacco Use   Smoking status: Former    Packs/day: 1.00    Years: 30.00    Pack years: 30.00    Types: Cigarettes    Quit date: 12/10/2016    Years since quitting: 5.3   Smokeless tobacco: Never  Vaping Use   Vaping Use: Never used  Substance and Sexual Activity   Alcohol use: Not Currently    Comment:  rarely   Drug use: No   Sexual activity: Not Currently    Birth control/protection: Surgical  Other Topics Concern   Not on file  Social History Narrative   Not on file   Social Determinants of Health   Financial Resource Strain: Not on file  Food Insecurity: Not on file  Transportation Needs: Not on file  Physical Activity: Not on file  Stress: Not on file  Social Connections: Not on file  Intimate Partner Violence: Not on file        Objective:    BP (!) 145/79   Pulse 74   Temp (!) 97.2 F (36.2 C)   Resp 20   Ht _0  (1.575 m)   Wt 175 lb (79.4 kg)   LMP 08/03/2013   SpO2 99%   BMI 32.01 kg/m   Wt Readings from Last 3 Encounters:  04/09/22 175 lb (79.4 kg)  01/04/22 183 lb 9.6 oz (83.3 kg)  07/26/21 185 lb 12.8 oz (84.3 kg)    Physical Exam Vitals reviewed.  Constitutional:      General: She is not in acute distress.    Appearance: Normal appearance. She is obese. She is not ill-appearing, toxic-appearing or  diaphoretic.  HENT:     Head: Normocephalic and atraumatic.  Eyes:     General: No scleral icterus.       Right eye: No discharge.        Left eye: No discharge.     Conjunctiva/sclera: Conjunctivae normal.  Cardiovascular:     Rate and Rhythm: Normal rate and regular rhythm.     Heart sounds: Normal heart sounds. No murmur heard.   No friction rub. No gallop.  Pulmonary:     Effort: Pulmonary effort is normal. No respiratory distress.     Breath sounds: Normal breath sounds. No stridor. No wheezing, rhonchi or rales.  Musculoskeletal:        General: Normal range of motion.     Cervical back: Normal range of motion.  Skin:    General: Skin is warm and dry.     Capillary Refill: Capillary refill takes less than 2 seconds.  Neurological:     General: No focal deficit present.     Mental Status: She is alert and oriented to person, place, and time. Mental status is at baseline.  Psychiatric:        Mood and Affect: Mood normal.        Behavior: Behavior normal.        Thought Content: Thought content normal.        Judgment: Judgment normal.   Diabetic Foot Exam - Simple   No data filed     No results found for: TSH Lab Results  Component Value Date   WBC 6.0 01/04/2022   HGB 13.5 01/04/2022   HCT 40.2 01/04/2022   MCV 87 01/04/2022   PLT 405 01/04/2022   Lab Results  Component Value Date   NA 141 01/04/2022   K 4.3 01/04/2022   CO2 26 01/04/2022   GLUCOSE 137 (H) 01/04/2022   BUN 9 01/04/2022   CREATININE 0.74 01/04/2022   BILITOT <0.2 01/04/2022   ALKPHOS 126 (H) 01/04/2022   AST 15 01/04/2022   ALT 13 01/04/2022   PROT 7.2 01/04/2022   ALBUMIN 4.5 01/04/2022   CALCIUM 10.1 01/04/2022   ANIONGAP 10 12/13/2016   EGFR 95 01/04/2022   Lab Results  Component Value Date   CHOL 166 01/04/2022  Lab Results  Component Value Date   HDL 36 (L) 01/04/2022   Lab Results  Component Value Date   LDLCALC 87 01/04/2022   Lab Results  Component Value Date    TRIG 257 (H) 01/04/2022   Lab Results  Component Value Date   CHOLHDL 4.6 (H) 01/04/2022   Lab Results  Component Value Date   HGBA1C 7.1 (H) 01/04/2022

## 2022-04-10 ENCOUNTER — Encounter: Payer: Self-pay | Admitting: Family Medicine

## 2022-04-10 LAB — CBC WITH DIFFERENTIAL/PLATELET
Basophils Absolute: 0.1 10*3/uL (ref 0.0–0.2)
Basos: 1 %
EOS (ABSOLUTE): 0.5 10*3/uL — ABNORMAL HIGH (ref 0.0–0.4)
Eos: 8 %
Hematocrit: 36.2 % (ref 34.0–46.6)
Hemoglobin: 12.3 g/dL (ref 11.1–15.9)
Immature Grans (Abs): 0 10*3/uL (ref 0.0–0.1)
Immature Granulocytes: 0 %
Lymphocytes Absolute: 1.6 10*3/uL (ref 0.7–3.1)
Lymphs: 25 %
MCH: 29.6 pg (ref 26.6–33.0)
MCHC: 34 g/dL (ref 31.5–35.7)
MCV: 87 fL (ref 79–97)
Monocytes Absolute: 0.5 10*3/uL (ref 0.1–0.9)
Monocytes: 8 %
Neutrophils Absolute: 3.9 10*3/uL (ref 1.4–7.0)
Neutrophils: 58 %
Platelets: 429 10*3/uL (ref 150–450)
RBC: 4.15 x10E6/uL (ref 3.77–5.28)
RDW: 12.9 % (ref 11.7–15.4)
WBC: 6.6 10*3/uL (ref 3.4–10.8)

## 2022-04-10 LAB — LIPID PANEL
Chol/HDL Ratio: 3.2 ratio (ref 0.0–4.4)
Cholesterol, Total: 97 mg/dL — ABNORMAL LOW (ref 100–199)
HDL: 30 mg/dL — ABNORMAL LOW (ref >39)
LDL Chol Calc (NIH): 37 mg/dL (ref 0–99)
Triglycerides: 181 mg/dL — ABNORMAL HIGH (ref 0–149)
VLDL Cholesterol Cal: 30 mg/dL (ref 5–40)

## 2022-04-10 LAB — CMP14+EGFR
ALT: 13 IU/L (ref 0–32)
AST: 17 IU/L (ref 0–40)
Albumin/Globulin Ratio: 1.8 (ref 1.2–2.2)
Albumin: 4.4 g/dL (ref 3.8–4.9)
Alkaline Phosphatase: 114 IU/L (ref 44–121)
BUN/Creatinine Ratio: 11 (ref 9–23)
BUN: 12 mg/dL (ref 6–24)
Bilirubin Total: 0.5 mg/dL (ref 0.0–1.2)
CO2: 23 mmol/L (ref 20–29)
Calcium: 10 mg/dL (ref 8.7–10.2)
Chloride: 106 mmol/L (ref 96–106)
Creatinine, Ser: 1.08 mg/dL — ABNORMAL HIGH (ref 0.57–1.00)
Globulin, Total: 2.5 g/dL (ref 1.5–4.5)
Glucose: 139 mg/dL — ABNORMAL HIGH (ref 70–99)
Potassium: 5 mmol/L (ref 3.5–5.2)
Sodium: 143 mmol/L (ref 134–144)
Total Protein: 6.9 g/dL (ref 6.0–8.5)
eGFR: 60 mL/min/{1.73_m2} (ref >59)

## 2022-04-16 ENCOUNTER — Telehealth: Payer: Self-pay | Admitting: Family Medicine

## 2022-04-16 DIAGNOSIS — E1165 Type 2 diabetes mellitus with hyperglycemia: Secondary | ICD-10-CM

## 2022-04-16 MED ORDER — TRULICITY 3 MG/0.5ML ~~LOC~~ SOAJ: 3.0000 mg | SUBCUTANEOUS | 0 refills | Status: DC

## 2022-04-16 NOTE — Telephone Encounter (Signed)
  Prescription Request  04/16/2022  Is this a "Controlled Substance" medicine? No  Have you seen your PCP in the last 2 weeks? No  If YES, route message to pool  -  If NO, patient needs to be scheduled for appointment.  What is the name of the medication or equipment? Ozempic  Have you contacted your pharmacy to request a refill? NO   Which pharmacy would you like this sent to? [facility]   Patient notified that their request is being sent to the clinical staff for review and that they should receive a response within 2 business days.

## 2022-04-16 NOTE — Telephone Encounter (Signed)
Pt aware her Trulicity sent into [facility]

## 2022-04-23 ENCOUNTER — Other Ambulatory Visit: Payer: Self-pay | Admitting: Family Medicine

## 2022-04-23 DIAGNOSIS — I10 Essential (primary) hypertension: Secondary | ICD-10-CM

## 2022-04-23 DIAGNOSIS — E1165 Type 2 diabetes mellitus with hyperglycemia: Secondary | ICD-10-CM

## 2022-04-23 MED ORDER — METFORMIN HCL ER 500 MG PO TB24: 500.0000 mg | ORAL_TABLET | Freq: Two times a day (BID) | ORAL | 1 refills | Status: DC

## 2022-04-23 NOTE — Telephone Encounter (Signed)
RF request for Metformin 500 mg 24 hr tab came in for 2 tabs BID 04/09/22 OV dose was decreased to 1 tab BID script was set to No Print, Rx sent today

## 2022-04-29 ENCOUNTER — Other Ambulatory Visit: Payer: Self-pay | Admitting: Family Medicine

## 2022-04-29 ENCOUNTER — Ambulatory Visit (INDEPENDENT_AMBULATORY_CARE_PROVIDER_SITE_OTHER): Payer: Self-pay | Admitting: Podiatry

## 2022-04-29 DIAGNOSIS — Z91199 Patient's noncompliance with other medical treatment and regimen due to unspecified reason: Secondary | ICD-10-CM

## 2022-04-29 DIAGNOSIS — Z8673 Personal history of transient ischemic attack (TIA), and cerebral infarction without residual deficits: Secondary | ICD-10-CM

## 2022-05-02 NOTE — Progress Notes (Signed)
   Complete physical exam  Patient: Cindy Garrett   DOB: 08/24/2000   56 y.o. Female  MRN: [id number]  Subjective:    No chief complaint on file.   Cindy Garrett is a 56 y.o. female who presents today for a complete physical exam. She reports consuming a {diet types:17450} diet. {types:19826} She generally feels {DESC; WELL/FAIRLY WELL/POORLY:18703}. She reports sleeping {DESC; WELL/FAIRLY WELL/POORLY:18703}. She {does/does not:200015} have additional problems to discuss today.    Most recent fall risk assessment:    05/01/2022   10:42 AM  Fall Risk   Falls in the past year? 0  Number falls in past yr: 0  Injury with Fall? 0  Risk for fall due to : No Fall Risks  Follow up Falls evaluation completed     Most recent depression screenings:    05/01/2022   10:42 AM 03/22/2021   10:46 AM  PHQ 2/9 Scores  PHQ - 2 Score 0 0  PHQ- 9 Score 5     {VISON DENTAL STD PSA (Optional):27386}  {History (Optional):23778}  Patient Care Team: Jessup, Joy, NP as PCP - General (Nurse Practitioner)   Outpatient Medications Prior to Visit  Medication Sig   fluticasone (FLONASE) 50 MCG/ACT nasal spray Place 2 sprays into both nostrils in the morning and at bedtime. After 7 days, reduce to once daily.   norgestimate-ethinyl estradiol (SPRINTEC 28) 0.25-35 MG-MCG tablet Take 1 tablet by mouth daily.   Nystatin POWD Apply liberally to affected area 2 times per day   spironolactone (ALDACTONE) 100 MG tablet Take 1 tablet (100 mg total) by mouth daily.   No facility-administered medications prior to visit.    ROS        Objective:     There were no vitals taken for this visit. {Vitals History (Optional):23777}  Physical Exam   No results found for any visits on 06/06/22. {Show previous labs (optional):23779}    Assessment & Plan:    Routine Health Maintenance and Physical Exam  Immunization History  Administered Date(s) Administered   DTaP 11/07/1999, 01/03/2000,  03/13/2000, 11/27/2000, 06/12/2004   Hepatitis A 04/08/2008, 04/14/2009   Hepatitis B 08/25/1999, 10/02/1999, 03/13/2000   HiB (PRP-OMP) 11/07/1999, 01/03/2000, 03/13/2000, 11/27/2000   IPV 11/07/1999, 01/03/2000, 09/01/2000, 06/12/2004   Influenza,inj,Quad PF,6+ Mos 07/15/2014   Influenza-Unspecified 10/14/2012   MMR 09/01/2001, 06/12/2004   Meningococcal Polysaccharide 04/13/2012   Pneumococcal Conjugate-13 11/27/2000   Pneumococcal-Unspecified 03/13/2000, 05/27/2000   Tdap 04/13/2012   Varicella 09/01/2000, 04/08/2008    Health Maintenance  Topic Date Due   HIV Screening  Never done   Hepatitis C Screening  Never done   INFLUENZA VACCINE  06/04/2022   PAP-Cervical Cytology Screening  06/06/2022 (Originally 08/23/2020)   PAP SMEAR-Modifier  06/06/2022 (Originally 08/23/2020)   TETANUS/TDAP  06/06/2022 (Originally 04/13/2022)   HPV VACCINES  Discontinued   COVID-19 Vaccine  Discontinued    Discussed health benefits of physical activity, and encouraged her to engage in regular exercise appropriate for her age and condition.  Problem List Items Addressed This Visit   None Visit Diagnoses     Annual physical exam    -  Primary   Cervical cancer screening       Need for Tdap vaccination          No follow-ups on file.     Joy Jessup, NP   

## 2022-05-23 ENCOUNTER — Other Ambulatory Visit: Payer: Self-pay | Admitting: Family Medicine

## 2022-05-23 DIAGNOSIS — I25118 Atherosclerotic heart disease of native coronary artery with other forms of angina pectoris: Secondary | ICD-10-CM

## 2022-05-23 DIAGNOSIS — E1165 Type 2 diabetes mellitus with hyperglycemia: Secondary | ICD-10-CM

## 2022-05-23 DIAGNOSIS — E785 Hyperlipidemia, unspecified: Secondary | ICD-10-CM

## 2022-06-07 ENCOUNTER — Other Ambulatory Visit: Payer: Self-pay | Admitting: Family Medicine

## 2022-06-07 DIAGNOSIS — E1165 Type 2 diabetes mellitus with hyperglycemia: Secondary | ICD-10-CM

## 2022-06-27 ENCOUNTER — Other Ambulatory Visit: Payer: Self-pay | Admitting: Family Medicine

## 2022-06-27 DIAGNOSIS — E1165 Type 2 diabetes mellitus with hyperglycemia: Secondary | ICD-10-CM

## 2022-07-10 ENCOUNTER — Encounter: Payer: Self-pay | Admitting: Family Medicine

## 2022-07-10 ENCOUNTER — Ambulatory Visit (INDEPENDENT_AMBULATORY_CARE_PROVIDER_SITE_OTHER): Payer: Medicare Other | Admitting: Family Medicine

## 2022-07-10 VITALS — BP 160/73 | HR 56 | Temp 97.8°F | Resp 20 | Ht 62.0 in | Wt 183.0 lb

## 2022-07-10 DIAGNOSIS — I252 Old myocardial infarction: Secondary | ICD-10-CM | POA: Diagnosis not present

## 2022-07-10 DIAGNOSIS — E785 Hyperlipidemia, unspecified: Secondary | ICD-10-CM

## 2022-07-10 DIAGNOSIS — Z951 Presence of aortocoronary bypass graft: Secondary | ICD-10-CM | POA: Diagnosis not present

## 2022-07-10 DIAGNOSIS — I25118 Atherosclerotic heart disease of native coronary artery with other forms of angina pectoris: Secondary | ICD-10-CM

## 2022-07-10 DIAGNOSIS — E1165 Type 2 diabetes mellitus with hyperglycemia: Secondary | ICD-10-CM | POA: Diagnosis not present

## 2022-07-10 DIAGNOSIS — Z8673 Personal history of transient ischemic attack (TIA), and cerebral infarction without residual deficits: Secondary | ICD-10-CM

## 2022-07-10 DIAGNOSIS — I10 Essential (primary) hypertension: Secondary | ICD-10-CM | POA: Diagnosis not present

## 2022-07-10 LAB — BAYER DCA HB A1C WAIVED: HB A1C (BAYER DCA - WAIVED): 5.9 % — ABNORMAL HIGH (ref 4.8–5.6)

## 2022-07-10 MED ORDER — METFORMIN HCL ER 500 MG PO TB24
500.0000 mg | ORAL_TABLET | Freq: Two times a day (BID) | ORAL | 1 refills | Status: DC
Start: 2022-07-10 — End: 2023-01-02

## 2022-07-10 MED ORDER — TRULICITY 3 MG/0.5ML ~~LOC~~ SOAJ: 3.0000 mg | SUBCUTANEOUS | 1 refills | Status: DC

## 2022-07-10 MED ORDER — AMLODIPINE BESYLATE 5 MG PO TABS: 5.0000 mg | ORAL_TABLET | Freq: Every day | ORAL | 1 refills | Status: DC

## 2022-07-10 NOTE — Progress Notes (Signed)
Assessment & Plan:  1. Type 2 diabetes mellitus with hyperglycemia, without long-term current use of insulin (HCC) A1c 5.9 today.  - Diabetes is at goal of A1c < 7. - Medications: continue current medications. I offered to increase Trulicity and D/C metformin, but she does not wish to make any further changes.  - Home glucose monitoring: continue monitoring - Patient is currently taking a statin. Patient is taking an ACE-inhibitor/ARB.   Diabetes Health Maintenance Due  Topic Date Due   OPHTHALMOLOGY EXAM  Never done   HEMOGLOBIN A1C  10/09/2022   FOOT EXAM  01/24/2023    Lab Results  Component Value Date   LABMICR 89.3 01/04/2022   LABMICR 174.3 01/20/2020   - CBC with Differential/Platelet - CMP14+EGFR - Bayer DCA Hb A1c Waived - Dulaglutide (TRULICITY) 3 KX/3.8HW SOPN; Inject 3 mg into the skin once a week.  Dispense: 6 mL; Refill: 1 - metFORMIN (GLUCOPHAGE-XR) 500 MG 24 hr tablet; Take 1 tablet (500 mg total) by mouth 2 (two) times daily with a meal.  Dispense: 200 tablet; Refill: 1  2. Essential (primary) hypertension Uncontrolled. Started amlodipine in addition to current regimen. Encouraged to monitor blood pressure at home, keep a log, and bring it with her to her next appointment. - CBC with Differential/Platelet - CMP14+EGFR - amLODipine (NORVASC) 5 MG tablet; Take 1 tablet (5 mg total) by mouth daily.  Dispense: 90 tablet; Refill: 1  3. Dyslipidemia Well controlled on current regimen.  - CBC with Differential/Platelet - CMP14+EGFR  4. Coronary artery disease involving native coronary artery of native heart with other form of angina pectoris ([place]) Continue atorvastatin and Plavix.   5. History of MI (myocardial infarction) Continue atorvastatin and Plavix.   6. S/P CABG x 3 Continue atorvastatin and Plavix.   7. History of CVA (cerebrovascular accident) Continue atorvastatin and Plavix.    Return in about 6 weeks (around 08/21/2022) for HTN with Dr.  Livia Snellen.  Hendricks Limes, MSN, APRN, FNP-C [facility]  Subjective:    Patient ID: Cindy Garrett, female    DOB: 05-14-1966, 56 y.o.   MRN: [id number]  Patient Care Team: Loman Brooklyn, FNP as PCP - General (Family Medicine) Harl Bowie Alphonse Guild, MD as PCP - Cardiology (Cardiology) Harlen Labs, MD as Referring Physician (Optometry)   Chief Complaint:  Chief Complaint  Patient presents with   Medical Management of Chronic Issues    3 mo     HPI: Cindy Garrett is a 56 y.o. female presenting on 07/10/2022 for Medical Management of Chronic Issues (3 mo )  Diabetes with hypertension and hyperlipidemia: Patient presents for follow up of diabetes. Current symptoms include: hyperglycemia. Known diabetic complications: cerebrovascular disease. Medication compliance: taking metformin 500 mg twice daily and Trulicity 3 mg weekly. Current diet: in general, a "healthy" diet  , eating better; using an air fryer . Current exercise: walking. Home blood sugar records: BGs consistently in an acceptable range. Is she  on ACE inhibitor or angiotensin II receptor blocker? Yes (Losartan). Is she on a statin? Yes (Atorvastatin).   Hypertension: patient does monitor her blood pressure at home and reports readings 140-150s/60-70s.   New complaints: None    Social history:  Relevant past medical, surgical, family and social history reviewed and updated as indicated. Interim medical history since our last visit reviewed.  Allergies and medications reviewed and updated.  DATA REVIEWED: CHART IN EPIC  ROS: Negative unless specifically indicated above in HPI.  Current Outpatient Medications:    atorvastatin (LIPITOR) 80 MG tablet, TAKE 1 TABLET BY MOUTH DAILY AT  6 PM., Disp: 90 tablet, Rfl: 1   clopidogrel (PLAVIX) 75 MG tablet, TAKE 1 TABLET BY MOUTH DAILY, Disp: 100 tablet, Rfl: 0   fluticasone (FLONASE) 50 MCG/ACT nasal spray, Place 2 sprays into both nostrils daily.,  Disp: 16 g, Rfl: 0   loratadine (CLARITIN) 10 MG tablet, Take 1 tablet (10 mg total) by mouth daily., Disp: 30 tablet, Rfl: 0   losartan (COZAAR) 100 MG tablet, TAKE 1 TABLET BY MOUTH DAILY, Disp: 90 tablet, Rfl: 1   metFORMIN (GLUCOPHAGE-XR) 500 MG 24 hr tablet, TAKE 1 TABLET BY MOUTH TWICE  DAILY WITH A MEAL, Disp: 200 tablet, Rfl: 2   metoprolol tartrate (LOPRESSOR) 25 MG tablet, TAKE 1 TABLET BY MOUTH DAILY, Disp: 90 tablet, Rfl: 1   TRULICITY 3 XY/5.8PF SOPN, INJECT THE CONTENTS OF ONE PEN  SUBCUTANEOUSLY WEEKLY, Disp: 6 mL, Rfl: 0   Allergies  Allergen Reactions   Lisinopril Cough   Penicillins Itching and Rash    Has patient had a PCN reaction causing immediate rash, facial/tongue/throat swelling, SOB or lightheadedness with hypotension: Yes Has patient had a PCN reaction causing severe rash involving mucus membranes or skin necrosis: Yes Has patient had a PCN reaction that required hospitalization Yes Has patient had a PCN reaction occurring within the last 10 years: No If all of the above answers are "NO", then may proceed with Cephalosporin use.    Past Medical History:  Diagnosis Date   Depression    Diabetes mellitus without complication ([place])    Hyperlipidemia    Hypertension    Stroke [facility])     Past Surgical History:  Procedure Laterality Date   COLONOSCOPY N/A 05/26/2015   Procedure: COLONOSCOPY;  Surgeon: Danie Binder, MD;  [facility];  Service: Endoscopy;  Laterality: N/A;  2:00   CORONARY ARTERY BYPASS GRAFT N/A 12/10/2016   Procedure: CORONARY ARTERY BYPASS GRAFTING (CABG) times three using the left internal mammary artery and the right greater saphenous vein;  Surgeon: Gaye Pollack, MD;  Location: [place];  Service: Open Heart Surgery;  Laterality: N/A;   LEFT HEART CATH AND CORONARY ANGIOGRAPHY N/A 12/10/2016   Procedure: Left Heart Cath and Coronary Angiography;  Surgeon: Troy Sine, MD;  Location: [place] [facility];  Service: Cardiovascular;   Laterality: N/A;   TEE WITHOUT CARDIOVERSION N/A 12/10/2016   Procedure: TRANSESOPHAGEAL ECHOCARDIOGRAM (TEE);  Surgeon: Gaye Pollack, MD;  Location: [place];  Service: Open Heart Surgery;  Laterality: N/A;   TUBAL LIGATION      Social History   Socioeconomic History   Marital status: Legally Separated    Spouse name: Not on file   Number of children: Not on file   Years of education: Not on file   Highest education level: Not on file  Occupational History   Not on file  Tobacco Use   Smoking status: Former    Packs/day: 1.00    Years: 30.00    Total pack years: 30.00    Types: Cigarettes    Quit date: 12/10/2016    Years since quitting: 5.5   Smokeless tobacco: Never  Vaping Use   Vaping Use: Never used  Substance and Sexual Activity   Alcohol use: Not Currently    Comment: rarely   Drug use: No   Sexual activity: Not Currently    Birth control/protection: Surgical  Other Topics  Concern   Not on file  Social History Narrative   Not on file   Social Determinants of Health   Financial Resource Strain: Not on file  Food Insecurity: Not on file  Transportation Needs: Not on file  Physical Activity: Not on file  Stress: Not on file  Social Connections: Not on file  Intimate Partner Violence: Not on file        Objective:    BP (!) 160/73   Pulse (!) 56   Temp 97.8 F (36.6 C)   Resp 20   Ht '5\' 2"'  (1.575 m)   Wt 183 lb (83 kg)   LMP 08/03/2013   SpO2 99%   BMI 33.47 kg/m   Wt Readings from Last 3 Encounters:  07/10/22 183 lb (83 kg)  04/09/22 175 lb (79.4 kg)  01/04/22 183 lb 9.6 oz (83.3 kg)    Physical Exam Vitals reviewed.  Constitutional:      General: She is not in acute distress.    Appearance: Normal appearance. She is obese. She is not ill-appearing, toxic-appearing or diaphoretic.  HENT:     Head: Normocephalic and atraumatic.  Eyes:     General: No scleral icterus.       Right eye: No discharge.        Left eye: No discharge.      Conjunctiva/sclera: Conjunctivae normal.  Cardiovascular:     Rate and Rhythm: Normal rate and regular rhythm.     Heart sounds: Normal heart sounds. No murmur heard.    No friction rub. No gallop.  Pulmonary:     Effort: Pulmonary effort is normal. No respiratory distress.     Breath sounds: Normal breath sounds. No stridor. No wheezing, rhonchi or rales.  Musculoskeletal:        General: Normal range of motion.     Cervical back: Normal range of motion.  Skin:    General: Skin is warm and dry.     Capillary Refill: Capillary refill takes less than 2 seconds.  Neurological:     General: No focal deficit present.     Mental Status: She is alert and oriented to person, place, and time. Mental status is at baseline.  Psychiatric:        Mood and Affect: Mood normal.        Behavior: Behavior normal.        Thought Content: Thought content normal.        Judgment: Judgment normal.    No results found for: "TSH" Lab Results  Component Value Date   WBC 6.6 04/09/2022   HGB 12.3 04/09/2022   HCT 36.2 04/09/2022   MCV 87 04/09/2022   PLT 429 04/09/2022   Lab Results  Component Value Date   NA 143 04/09/2022   K 5.0 04/09/2022   CO2 23 04/09/2022   GLUCOSE 139 (H) 04/09/2022   BUN 12 04/09/2022   CREATININE 1.08 (H) 04/09/2022   BILITOT 0.5 04/09/2022   ALKPHOS 114 04/09/2022   AST 17 04/09/2022   ALT 13 04/09/2022   PROT 6.9 04/09/2022   ALBUMIN 4.4 04/09/2022   CALCIUM 10.0 04/09/2022   ANIONGAP 10 12/13/2016   EGFR 60 04/09/2022   Lab Results  Component Value Date   CHOL 97 (L) 04/09/2022   Lab Results  Component Value Date   HDL 30 (L) 04/09/2022   Lab Results  Component Value Date   LDLCALC 37 04/09/2022   Lab Results  Component Value Date  TRIG 181 (H) 04/09/2022   Lab Results  Component Value Date   CHOLHDL 3.2 04/09/2022   Lab Results  Component Value Date   HGBA1C 5.7 (H) 04/09/2022

## 2022-07-11 ENCOUNTER — Encounter: Payer: Self-pay | Admitting: Family Medicine

## 2022-07-11 LAB — CBC WITH DIFFERENTIAL/PLATELET
Basophils Absolute: 0 10*3/uL (ref 0.0–0.2)
Basos: 1 %
EOS (ABSOLUTE): 0.3 10*3/uL (ref 0.0–0.4)
Eos: 5 %
Hematocrit: 33.4 % — ABNORMAL LOW (ref 34.0–46.6)
Hemoglobin: 11.3 g/dL (ref 11.1–15.9)
Immature Grans (Abs): 0 10*3/uL (ref 0.0–0.1)
Immature Granulocytes: 0 %
Lymphocytes Absolute: 2.2 10*3/uL (ref 0.7–3.1)
Lymphs: 31 %
MCH: 29.1 pg (ref 26.6–33.0)
MCHC: 33.8 g/dL (ref 31.5–35.7)
MCV: 86 fL (ref 79–97)
Monocytes Absolute: 0.5 10*3/uL (ref 0.1–0.9)
Monocytes: 8 %
Neutrophils Absolute: 3.9 10*3/uL (ref 1.4–7.0)
Neutrophils: 55 %
Platelets: 415 10*3/uL (ref 150–450)
RBC: 3.88 x10E6/uL (ref 3.77–5.28)
RDW: 12.9 % (ref 11.7–15.4)
WBC: 7 10*3/uL (ref 3.4–10.8)

## 2022-07-11 LAB — CMP14+EGFR
ALT: 13 IU/L (ref 0–32)
AST: 17 IU/L (ref 0–40)
Albumin/Globulin Ratio: 1.9 (ref 1.2–2.2)
Albumin: 4 g/dL (ref 3.8–4.9)
Alkaline Phosphatase: 102 IU/L (ref 44–121)
BUN/Creatinine Ratio: 13 (ref 9–23)
BUN: 12 mg/dL (ref 6–24)
Bilirubin Total: 0.3 mg/dL (ref 0.0–1.2)
CO2: 22 mmol/L (ref 20–29)
Calcium: 9.2 mg/dL (ref 8.7–10.2)
Chloride: 106 mmol/L (ref 96–106)
Creatinine, Ser: 0.9 mg/dL (ref 0.57–1.00)
Globulin, Total: 2.1 g/dL (ref 1.5–4.5)
Glucose: 96 mg/dL (ref 70–99)
Potassium: 4.4 mmol/L (ref 3.5–5.2)
Sodium: 143 mmol/L (ref 134–144)
Total Protein: 6.1 g/dL (ref 6.0–8.5)
eGFR: 75 mL/min/{1.73_m2} (ref >59)

## 2022-07-17 ENCOUNTER — Other Ambulatory Visit: Payer: Self-pay | Admitting: Family Medicine

## 2022-07-17 DIAGNOSIS — I10 Essential (primary) hypertension: Secondary | ICD-10-CM

## 2022-08-01 ENCOUNTER — Other Ambulatory Visit: Payer: Self-pay | Admitting: Family Medicine

## 2022-08-01 DIAGNOSIS — Z8673 Personal history of transient ischemic attack (TIA), and cerebral infarction without residual deficits: Secondary | ICD-10-CM

## 2022-08-22 ENCOUNTER — Encounter: Payer: Self-pay | Admitting: Family Medicine

## 2022-08-22 ENCOUNTER — Ambulatory Visit (INDEPENDENT_AMBULATORY_CARE_PROVIDER_SITE_OTHER): Payer: Medicare Other | Admitting: Family Medicine

## 2022-08-22 VITALS — BP 170/92 | HR 73 | Temp 98.7°F | Ht 62.0 in | Wt 175.0 lb

## 2022-08-22 DIAGNOSIS — I10 Essential (primary) hypertension: Secondary | ICD-10-CM

## 2022-08-22 LAB — URINALYSIS, ROUTINE W REFLEX MICROSCOPIC
Bilirubin, UA: NEGATIVE
Glucose, UA: NEGATIVE
Ketones, UA: NEGATIVE
Leukocytes,UA: NEGATIVE
Nitrite, UA: NEGATIVE
Protein,UA: NEGATIVE
RBC, UA: NEGATIVE
Specific Gravity, UA: 1.015 (ref 1.005–1.030)
Urobilinogen, Ur: 0.2 mg/dL (ref 0.2–1.0)
pH, UA: 5.5 (ref 5.0–7.5)

## 2022-08-22 MED ORDER — METOPROLOL TARTRATE 50 MG PO TABS: 50.0000 mg | ORAL_TABLET | Freq: Every day | ORAL | 1 refills | Status: DC

## 2022-08-22 MED ORDER — OLMESARTAN MEDOXOMIL 40 MG PO TABS: 40.0000 mg | ORAL_TABLET | Freq: Every day | ORAL | 1 refills | Status: DC

## 2022-08-22 NOTE — Progress Notes (Signed)
Subjective:  Patient ID: Cindy Garrett, female    DOB: Sep 02, 1966  Age: 56 y.o. MRN: [id number]  CC: Medical Management of Chronic Issues (Hypertension/Upset stpmach/diarrhea)   HPI Cindy Garrett presents for  follow-up of hypertension. Patient has no history of headache chest pain or shortness of breath or recent cough. Patient also denies symptoms of TIA such as focal numbness or weakness. Patient denies side effects from medication. States taking it regularly. Recent blood work for renal function & A1c are normal with similar BP readings Conflict with room mate. Having to take out eviction notice today.   History Cindy Garrett has a past medical history of Depression, Diabetes mellitus without complication ([place]), Hyperlipidemia, Hypertension, and Stroke ([place]).   She has a past surgical history that includes Tubal ligation; Colonoscopy (N/A, 05/26/2015); LEFT HEART CATH AND CORONARY ANGIOGRAPHY (N/A, 12/10/2016); Coronary artery bypass graft (N/A, 12/10/2016); and TEE without cardioversion (N/A, 12/10/2016).   Her family history includes Diabetes in her father; Heart disease in her mother; Hyperlipidemia in her sister; Hypertension in her sister; Mental illness in her brother.She reports that she quit smoking about 5 years ago. Her smoking use included cigarettes. She has a 30.00 pack-year smoking history. She has never used smokeless tobacco. She reports that she does not currently use alcohol. She reports that she does not use drugs.  Current Outpatient Medications on File Prior to Visit  Medication Sig Dispense Refill   amLODipine (NORVASC) 5 MG tablet Take 1 tablet (5 mg total) by mouth daily. 90 tablet 1   atorvastatin (LIPITOR) 80 MG tablet TAKE 1 TABLET BY MOUTH DAILY AT  6 PM. 90 tablet 1   clopidogrel (PLAVIX) 75 MG tablet TAKE 1 TABLET BY MOUTH DAILY 100 tablet 0   Dulaglutide (TRULICITY) 3 BZ/1.6RC SOPN Inject 3 mg into the skin once a week. 6 mL 1   fluticasone (FLONASE) 50 MCG/ACT nasal  spray Place 2 sprays into both nostrils daily. 16 g 0   metFORMIN (GLUCOPHAGE-XR) 500 MG 24 hr tablet Take 1 tablet (500 mg total) by mouth 2 (two) times daily with a meal. 200 tablet 1   loratadine (CLARITIN) 10 MG tablet Take 1 tablet (10 mg total) by mouth daily. 30 tablet 0   No current facility-administered medications on file prior to visit.    ROS Review of Systems  Constitutional: Negative.   HENT: Negative.    Eyes:  Negative for visual disturbance.  Respiratory:  Negative for shortness of breath.   Cardiovascular:  Negative for chest pain.  Gastrointestinal:  Negative for abdominal pain.  Musculoskeletal:  Negative for arthralgias.    Objective:  BP (!) 170/92 (BP Location: Right Arm, Cuff Size: Normal)   Pulse 73   Temp 98.7 F (37.1 C)   Ht 5\' 2"  (1.575 m)   Wt 175 lb (79.4 kg)   LMP 08/03/2013   SpO2 96%   BMI 32.01 kg/m   BP Readings from Last 3 Encounters:  08/22/22 (!) 170/92  07/10/22 (!) 160/73  04/09/22 (!) 145/79    Wt Readings from Last 3 Encounters:  08/22/22 175 lb (79.4 kg)  07/10/22 183 lb (83 kg)  04/09/22 175 lb (79.4 kg)     Physical Exam Constitutional:      General: She is not in acute distress.    Appearance: She is well-developed.  Cardiovascular:     Rate and Rhythm: Normal rate and regular rhythm.  Pulmonary:     Breath sounds: Normal breath sounds.  Musculoskeletal:  General: Normal range of motion.  Skin:    General: Skin is warm and dry.  Neurological:     Mental Status: She is alert and oriented to person, place, and time.       Assessment & Plan:   Cindy Garrett was seen today for medical management of chronic issues.  Diagnoses and all orders for this visit:  Accelerated essential hypertension -     Urinalysis, Routine w reflex microscopic -     metoprolol tartrate (LOPRESSOR) 50 MG tablet; Take 1 tablet (50 mg total) by mouth daily.  Other orders -     olmesartan (BENICAR) 40 MG tablet; Take 1 tablet (40  mg total) by mouth daily. For blood pressure   Allergies as of 08/22/2022       Reactions   Lisinopril Cough   Penicillins Itching, Rash   Has patient had a PCN reaction causing immediate rash, facial/tongue/throat swelling, SOB or lightheadedness with hypotension: Yes Has patient had a PCN reaction causing severe rash involving mucus membranes or skin necrosis: Yes Has patient had a PCN reaction that required hospitalization Yes Has patient had a PCN reaction occurring within the last 10 years: No If all of the above answers are "NO", then may proceed with Cephalosporin use.        Medication List        Accurate as of August 22, 2022  9:35 AM. If you have any questions, ask your nurse or doctor.          STOP taking these medications    losartan 100 MG tablet Commonly known as: COZAAR Stopped by: Claretta Fraise, MD       TAKE these medications    amLODipine 5 MG tablet Commonly known as: NORVASC Take 1 tablet (5 mg total) by mouth daily.   atorvastatin 80 MG tablet Commonly known as: LIPITOR TAKE 1 TABLET BY MOUTH DAILY AT  6 PM.   clopidogrel 75 MG tablet Commonly known as: PLAVIX TAKE 1 TABLET BY MOUTH DAILY   fluticasone 50 MCG/ACT nasal spray Commonly known as: FLONASE Place 2 sprays into both nostrils daily.   loratadine 10 MG tablet Commonly known as: CLARITIN Take 1 tablet (10 mg total) by mouth daily.   metFORMIN 500 MG 24 hr tablet Commonly known as: GLUCOPHAGE-XR Take 1 tablet (500 mg total) by mouth 2 (two) times daily with a meal.   metoprolol tartrate 50 MG tablet Commonly known as: LOPRESSOR Take 1 tablet (50 mg total) by mouth daily. What changed:  medication strength how much to take Changed by: Claretta Fraise, MD   olmesartan 40 MG tablet Commonly known as: Benicar Take 1 tablet (40 mg total) by mouth daily. For blood pressure Started by: Claretta Fraise, MD   Trulicity 3 0000000 Sopn Generic drug: Dulaglutide Inject 3 mg  into the skin once a week.        Meds ordered this encounter  Medications   olmesartan (BENICAR) 40 MG tablet    Sig: Take 1 tablet (40 mg total) by mouth daily. For blood pressure    Dispense:  90 tablet    Refill:  1   metoprolol tartrate (LOPRESSOR) 50 MG tablet    Sig: Take 1 tablet (50 mg total) by mouth daily.    Dispense:  90 tablet    Refill:  1    Please send a replace/new response with 100-Day Supply if appropriate to maximize member benefit. Requesting 1 year supply.    Discussed social causes  of stress & increase of BP  Follow-up: Return in about 2 weeks (around 09/05/2022).  Claretta Fraise, M.D.

## 2022-09-05 ENCOUNTER — Ambulatory Visit (INDEPENDENT_AMBULATORY_CARE_PROVIDER_SITE_OTHER): Payer: Medicare Other | Admitting: Family Medicine

## 2022-09-05 ENCOUNTER — Encounter: Payer: Self-pay | Admitting: Family Medicine

## 2022-09-05 VITALS — BP 154/67 | HR 57 | Temp 97.9°F | Ht 62.0 in | Wt 172.2 lb

## 2022-09-05 DIAGNOSIS — I10 Essential (primary) hypertension: Secondary | ICD-10-CM

## 2022-09-05 DIAGNOSIS — E1165 Type 2 diabetes mellitus with hyperglycemia: Secondary | ICD-10-CM

## 2022-09-05 MED ORDER — AMLODIPINE BESYLATE 10 MG PO TABS: 10.0000 mg | ORAL_TABLET | Freq: Every day | ORAL | 3 refills | Status: DC

## 2022-09-05 NOTE — Progress Notes (Signed)
Subjective:  Patient ID: Cindy Garrett, female    DOB: [birth year]  Age: 56 y.o. MRN: [id number]  CC: Follow-up and Hypertension   HPI Cindy Garrett presents for  presents for  follow-up of hypertension. Patient has no history of headache chest pain or shortness of breath or recent cough. Patient also denies symptoms of TIA such as focal numbness or weakness. Patient denies side effects from medication. States taking them regularly. Has home situation with live in that is annoying. Feels he needs to go. When he does BP should improve.   Taking glucose readings. Monitor broke. However weight is coming down.       09/05/2022    9:12 AM 08/22/2022    9:16 AM 07/10/2022    8:20 AM  Depression screen PHQ 2/9  Decreased Interest 0 0 0  Down, Depressed, Hopeless 0 0 0  PHQ - 2 Score 0 0 0  Altered sleeping  0 0  Tired, decreased energy  1 0  Change in appetite  0 0  Feeling bad or failure about yourself   0 0  Trouble concentrating  0 0  Moving slowly or fidgety/restless  0 0  Suicidal thoughts  0 0  PHQ-9 Score  1 0  Difficult doing work/chores  Not difficult at all Not difficult at all    History Cindy Garrett has a past medical history of Depression, Diabetes mellitus without complication ([place]), Hyperlipidemia, Hypertension, and Stroke ([place]).   She has a past surgical history that includes Tubal ligation; Colonoscopy (N/A, 05/26/2015); LEFT HEART CATH AND CORONARY ANGIOGRAPHY (N/A, 12/10/2016); Coronary artery bypass graft (N/A, 12/10/2016); and TEE without cardioversion (N/A, 12/10/2016).   Her family history includes Diabetes in her father; Heart disease in her mother; Hyperlipidemia in her sister; Hypertension in her sister; Mental illness in her brother.She reports that she quit smoking about 5 years ago. Her smoking use included cigarettes. She has a 30.00 pack-year smoking history. She has never used smokeless tobacco. She reports that she does not currently use alcohol. She reports that  she does not use drugs.    ROS Review of Systems  Objective:  BP (!) 154/67   Pulse (!) 57   Temp 97.9 F (36.6 C)   Ht 5\' 2"  (1.575 m)   Wt 172 lb 3.2 oz (78.1 kg)   LMP 08/03/2013   SpO2 98%   BMI 31.50 kg/m   BP Readings from Last 3 Encounters:  09/05/22 (!) 154/67  08/22/22 (!) 170/92  07/10/22 (!) 160/73    Wt Readings from Last 3 Encounters:  09/05/22 172 lb 3.2 oz (78.1 kg)  08/22/22 175 lb (79.4 kg)  07/10/22 183 lb (83 kg)     Physical Exam Constitutional:      General: She is not in acute distress.    Appearance: She is well-developed.  Cardiovascular:     Rate and Rhythm: Normal rate and regular rhythm.  Pulmonary:     Breath sounds: Normal breath sounds.  Musculoskeletal:        General: Normal range of motion.  Skin:    General: Skin is warm and dry.  Neurological:     Mental Status: She is alert and oriented to person, place, and time.       Assessment & Plan:   Cindy Garrett was seen today for follow-up and hypertension.  Diagnoses and all orders for this visit:  Type 2 diabetes mellitus with hyperglycemia, without long-term current use of insulin ([place])  Essential (primary)  hypertension -     amLODipine (NORVASC) 10 MG tablet; Take 1 tablet (10 mg total) by mouth daily.       I have changed Cindy Garrett's amLODipine. I am also having her maintain her loratadine, fluticasone, atorvastatin, Trulicity, metFORMIN, clopidogrel, olmesartan, and metoprolol tartrate.  Allergies as of 09/05/2022       Reactions   Lisinopril Cough   Penicillins Itching, Rash   Has patient had a PCN reaction causing immediate rash, facial/tongue/throat swelling, SOB or lightheadedness with hypotension: Yes Has patient had a PCN reaction causing severe rash involving mucus membranes or skin necrosis: Yes Has patient had a PCN reaction that required hospitalization Yes Has patient had a PCN reaction occurring within the last 10 years: No If all of the above  answers are "NO", then may proceed with Cephalosporin use.        Medication List        Accurate as of September 05, 2022  9:48 AM. If you have any questions, ask your nurse or doctor.          amLODipine 10 MG tablet Commonly known as: NORVASC Take 1 tablet (10 mg total) by mouth daily. What changed:  medication strength how much to take Changed by: Claretta Fraise, MD   atorvastatin 80 MG tablet Commonly known as: LIPITOR TAKE 1 TABLET BY MOUTH DAILY AT  6 PM.   clopidogrel 75 MG tablet Commonly known as: PLAVIX TAKE 1 TABLET BY MOUTH DAILY   fluticasone 50 MCG/ACT nasal spray Commonly known as: FLONASE Place 2 sprays into both nostrils daily.   loratadine 10 MG tablet Commonly known as: CLARITIN Take 1 tablet (10 mg total) by mouth daily.   metFORMIN 500 MG 24 hr tablet Commonly known as: GLUCOPHAGE-XR Take 1 tablet (500 mg total) by mouth 2 (two) times daily with a meal.   metoprolol tartrate 50 MG tablet Commonly known as: LOPRESSOR Take 1 tablet (50 mg total) by mouth daily.   olmesartan 40 MG tablet Commonly known as: Benicar Take 1 tablet (40 mg total) by mouth daily. For blood pressure   Trulicity 3 VV/6.1YW Sopn Generic drug: Dulaglutide Inject 3 mg into the skin once a week.       Increased amlodipine. Continue weight loss measures. Obtain new monitor. (Will provide scrip to her if needed)   Follow-up: Return in about 5 weeks (around 10/09/2022) for diabetes.  Claretta Fraise, M.D.

## 2022-10-16 ENCOUNTER — Ambulatory Visit: Payer: Medicare Other | Admitting: Family Medicine

## 2022-11-08 ENCOUNTER — Other Ambulatory Visit: Payer: Self-pay | Admitting: Family Medicine

## 2022-11-08 DIAGNOSIS — Z8673 Personal history of transient ischemic attack (TIA), and cerebral infarction without residual deficits: Secondary | ICD-10-CM

## 2022-11-13 ENCOUNTER — Ambulatory Visit (INDEPENDENT_AMBULATORY_CARE_PROVIDER_SITE_OTHER): Payer: 59

## 2022-11-13 VITALS — Ht 64.0 in | Wt 157.0 lb

## 2022-11-13 DIAGNOSIS — Z0000 Encounter for general adult medical examination without abnormal findings: Secondary | ICD-10-CM | POA: Diagnosis not present

## 2022-11-13 DIAGNOSIS — Z1231 Encounter for screening mammogram for malignant neoplasm of breast: Secondary | ICD-10-CM

## 2022-11-13 NOTE — Progress Notes (Signed)
Subjective:   Cindy Garrett is a 57 y.o. female who presents for Medicare Annual (Subsequent) preventive examination. I connected with  Duke Salvia on 11/13/22 by a audio enabled telemedicine application and verified that I am speaking with the correct person using two identifiers.  Patient Location: Home  Provider Location: Home Office  I discussed the limitations of evaluation and management by telemedicine. The patient expressed understanding and agreed to proceed.  Review of Systems     Cardiac Risk Factors include: advanced age (>52men, >55 women);hypertension;diabetes mellitus     Objective:    Today's Vitals   11/13/22 1036  Weight: 157 lb (71.2 kg)  Height: 5\' 4"  (1.626 m)   Body mass index is 26.95 kg/m.     11/13/2022   10:39 AM 02/05/2021   12:07 PM 04/14/2017    2:18 PM 07/14/2016    4:24 PM 05/02/2015   10:11 PM  Advanced Directives  Does Patient Have a Medical Advance Directive? No No No No No  Would patient like information on creating a medical advance directive? No - Patient declined No - Patient declined No - Patient declined No - patient declined information No - patient declined information    Current Medications (verified) Outpatient Encounter Medications as of 11/13/2022  Medication Sig   amLODipine (NORVASC) 10 MG tablet Take 1 tablet (10 mg total) by mouth daily.   atorvastatin (LIPITOR) 80 MG tablet TAKE 1 TABLET BY MOUTH DAILY AT  6 PM.   clopidogrel (PLAVIX) 75 MG tablet TAKE 1 TABLET BY MOUTH DAILY   Dulaglutide (TRULICITY) 3 MG/0.5ML SOPN Inject 3 mg into the skin once a week.   fluticasone (FLONASE) 50 MCG/ACT nasal spray Place 2 sprays into both nostrils daily.   metFORMIN (GLUCOPHAGE-XR) 500 MG 24 hr tablet Take 1 tablet (500 mg total) by mouth 2 (two) times daily with a meal.   metoprolol tartrate (LOPRESSOR) 50 MG tablet Take 1 tablet (50 mg total) by mouth daily.   olmesartan (BENICAR) 40 MG tablet Take 1 tablet (40 mg total) by  mouth daily. For blood pressure   loratadine (CLARITIN) 10 MG tablet Take 1 tablet (10 mg total) by mouth daily.   No facility-administered encounter medications on file as of 11/13/2022.    Allergies (verified) Lisinopril and Penicillins   History: Past Medical History:  Diagnosis Date   Depression    Diabetes mellitus without complication (HCC)    Hyperlipidemia    Hypertension    Stroke [facility])    Past Surgical History:  Procedure Laterality Date   COLONOSCOPY N/A 05/26/2015   Procedure: COLONOSCOPY;  Surgeon: 05/28/2015, MD;  Location: [facility] ENDO SUITE;  Service: Endoscopy;  Laterality: N/A;  2:00   CORONARY ARTERY BYPASS GRAFT N/A 12/10/2016   Procedure: CORONARY ARTERY BYPASS GRAFTING (CABG) times three using the left internal mammary artery and the right greater saphenous vein;  Surgeon: 02/07/2017, MD;  Location: [facility] OR;  Service: Open Heart Surgery;  Laterality: N/A;   LEFT HEART CATH AND CORONARY ANGIOGRAPHY N/A 12/10/2016   Procedure: Left Heart Cath and Coronary Angiography;  Surgeon: 02/07/2017, MD;  Location: [facility] INVASIVE CV LAB;  Service: Cardiovascular;  Laterality: N/A;   TEE WITHOUT CARDIOVERSION N/A 12/10/2016   Procedure: TRANSESOPHAGEAL ECHOCARDIOGRAM (TEE);  Surgeon: 02/07/2017, MD;  Location: [facility] OR;  Service: Open Heart Surgery;  Laterality: N/A;   TUBAL LIGATION     Family History  Problem Relation Age of Onset  Diabetes Father    Heart disease Mother    Hyperlipidemia Sister    Hypertension Sister    Mental illness Brother    Social History   Socioeconomic History   Marital status: Legally Separated    Spouse name: Not on file   Number of children: Not on file   Years of education: Not on file   Highest education level: Not on file  Occupational History   Not on file  Tobacco Use   Smoking status: Former    Packs/day: 1.00    Years: 30.00    Total pack years: 30.00    Types: Cigarettes    Quit date: 12/10/2016    Years since quitting:  5.9   Smokeless tobacco: Never  Vaping Use   Vaping Use: Never used  Substance and Sexual Activity   Alcohol use: Not Currently    Comment: rarely   Drug use: No   Sexual activity: Not Currently    Birth control/protection: Surgical  Other Topics Concern   Not on file  Social History Narrative   Not on file   Social Determinants of Health   Financial Resource Strain: Low Risk  (11/13/2022)   Overall Financial Resource Strain (CARDIA)    Difficulty of Paying Living Expenses: Not hard at all  Food Insecurity: No Food Insecurity (11/13/2022)   Hunger Vital Sign    Worried About Running Out of Food in the Last Year: Never true    [place] in the Last Year: Never true  Transportation Needs: No Transportation Needs (11/13/2022)   PRAPARE - Hydrologist (Medical): No    Lack of Transportation (Non-Medical): No  Physical Activity: Sufficiently Active (11/13/2022)   Exercise Vital Sign    Days of Exercise per Week: 5 days    Minutes of Exercise per Session: 60 min  Stress: No Stress Concern Present (11/13/2022)   [place]    Feeling of Stress : Not at all  Social Connections: Moderately Integrated (11/13/2022)   Social Connection and Isolation Panel [NHANES]    Frequency of Communication with Friends and Family: More than three times a week    Frequency of Social Gatherings with Friends and Family: More than three times a week    Attends Religious Services: More than 4 times per year    Active Member of [employer] or Organizations: No    Attends Music therapist: Never    Marital Status: Married    Tobacco Counseling Counseling given: Not Answered   Clinical Intake:  Pre-visit preparation completed: Yes  Pain : No/denies pain     Nutritional Risks: None Diabetes: Yes CBG done?: No Did pt. bring in CBG monitor from home?: No  How often do you need to have  someone help you when you read instructions, pamphlets, or other written materials from your doctor or pharmacy?: 1 - Never  Diabetic?yes Nutrition Risk Assessment:  Has the patient had any N/V/D within the last 2 months?  No  Does the patient have any non-healing wounds?  No  Has the patient had any unintentional weight loss or weight gain?  No   Diabetes:  Is the patient diabetic?  Yes  If diabetic, was a CBG obtained today?  No  Did the patient bring in their glucometer from home?  No  How often do you monitor your CBG's? Daily .   Financial Strains and Diabetes Management:  Are  you having any financial strains with the device, your supplies or your medication? No .  Does the patient want to be seen by Chronic Care Management for management of their diabetes?  No  Would the patient like to be referred to a Nutritionist or for Diabetic Management?  No   Diabetic Exams:  Diabetic Eye Exam: Completed 12/2021 Diabetic Foot Exam: Overdue, Pt has been advised about the importance in completing this exam. Pt is scheduled for diabetic foot exam on next office visit .   Interpreter Needed?: No  Information entered by :: Jadene Pierini, LPN   Activities of Daily Living    11/13/2022   10:40 AM  In your present state of health, do you have any difficulty performing the following activities:  Hearing? 0  Vision? 0  Difficulty concentrating or making decisions? 0  Walking or climbing stairs? 0  Dressing or bathing? 0  Doing errands, shopping? 0  Preparing Food and eating ? N  Using the Toilet? N  In the past six months, have you accidently leaked urine? N  Do you have problems with loss of bowel control? N  Managing your Medications? N  Managing your Finances? N  Housekeeping or managing your Housekeeping? N    Patient Care Team: Claretta Fraise, MD as PCP - General (Family Medicine) Harl Bowie Alphonse Guild, MD as PCP - Cardiology (Cardiology) Harlen Labs, MD as Referring  Physician (Optometry)  Indicate any recent Medical Services you may have received from other than [facility] providers in the past year (date may be approximate).     Assessment:   This is a routine wellness examination for Cindy Garrett.  Hearing/Vision screen Hearing Screening - Comments:: Wears rx glasses - up to date with routine eye exams with  Dr.Lee  Dietary issues and exercise activities discussed: Current Exercise Habits: Home exercise routine, Type of exercise: walking, Time (Minutes): 30, Frequency (Times/Week): 5, Weekly Exercise (Minutes/Week): 150, Intensity: Mild, Exercise limited by: None identified   Goals Addressed             This Visit's Progress    DIET - INCREASE WATER INTAKE         Depression Screen    11/13/2022   10:38 AM 09/05/2022    9:12 AM 08/22/2022    9:16 AM 07/10/2022    8:20 AM 04/09/2022    8:26 AM 01/04/2022    3:49 PM 07/26/2021   11:20 AM  PHQ 2/9 Scores  PHQ - 2 Score 0 0 0 0 1 0 0  PHQ- 9 Score   1 0 7 2 0    Fall Risk    11/13/2022   10:37 AM 09/05/2022    9:12 AM 08/22/2022    9:16 AM 07/10/2022    8:20 AM 04/09/2022    8:26 AM  Fall Risk   Falls in the past year? 0 0 0 0 0  Number falls in past yr: 0      Injury with Fall? 0      Risk for fall due to : No Fall Risks      Follow up Falls prevention discussed        [place]:  Any stairs in or around the home? No  If so, are there any without handrails? No  Home free of loose throw rugs in walkways, pet beds, electrical cords, etc? Yes  Adequate lighting in your home to reduce risk of falls? Yes   ASSISTIVE  DEVICES UTILIZED TO PREVENT FALLS:  Life alert? No  Use of a cane, walker or w/c? No  Grab bars in the bathroom? Yes  Shower chair or bench in shower? Yes  Elevated toilet seat or a handicapped toilet? Yes        11/13/2022   10:40 AM  6CIT Screen  What Year? 0 points  What month? 0 points  What time? 0 points  Count back from 20 0 points   Months in reverse 0 points  Repeat phrase 0 points  Total Score 0 points    Immunizations Immunization History  Administered Date(s) Administered   Moderna Sars-Covid-2 Vaccination 08/18/2020   Td 07/15/1995   Tdap 11/05/1999    TDAP status: Due, Education has been provided regarding the importance of this vaccine. Advised may receive this vaccine at local pharmacy or Health Dept. Aware to provide a copy of the vaccination record if obtained from local pharmacy or Health Dept. Verbalized acceptance and understanding.  Flu Vaccine status: Due, Education has been provided regarding the importance of this vaccine. Advised may receive this vaccine at local pharmacy or Health Dept. Aware to provide a copy of the vaccination record if obtained from local pharmacy or Health Dept. Verbalized acceptance and understanding.  Pneumococcal vaccine status: Due, Education has been provided regarding the importance of this vaccine. Advised may receive this vaccine at local pharmacy or Health Dept. Aware to provide a copy of the vaccination record if obtained from local pharmacy or Health Dept. Verbalized acceptance and understanding.  Covid-19 vaccine status: Completed vaccines  Qualifies for Shingles Vaccine? Yes   Zostavax completed No   Shingrix Completed?: No.    Education has been provided regarding the importance of this vaccine. Patient has been advised to call insurance company to determine out of pocket expense if they have not yet received this vaccine. Advised may also receive vaccine at local pharmacy or Health Dept. Verbalized acceptance and understanding.  Screening Tests Health Maintenance  Topic Date Due   OPHTHALMOLOGY EXAM  Never done   DTaP/Tdap/Td (3 - Td or Tdap) 11/04/2009   Lung Cancer Screening  Never done   COVID-19 Vaccine (2 - 2023-24 season) 07/05/2022   INFLUENZA VACCINE  02/02/2023 (Originally 06/04/2022)   Hepatitis C Screening  04/10/2023 (Originally 02[birth year]/1983)    MAMMOGRAM  09/06/2023 (Originally 08/01/2022)   Diabetic kidney evaluation - Urine ACR  01/05/2023   HEMOGLOBIN A1C  01/08/2023   FOOT EXAM  01/24/2023   Diabetic kidney evaluation - eGFR measurement  07/11/2023   Medicare Annual Wellness (AWV)  11/14/2023   PAP SMEAR-Modifier  07/26/2024   COLONOSCOPY (Pts 45-66yrs Insurance coverage will need to be confirmed)  05/25/2025   HIV Screening  Completed   HPV VACCINES  Aged Out   Zoster Vaccines- Shingrix  Discontinued    Health Maintenance  Health Maintenance Due  Topic Date Due   OPHTHALMOLOGY EXAM  Never done   DTaP/Tdap/Td (3 - Td or Tdap) 11/04/2009   Lung Cancer Screening  Never done   COVID-19 Vaccine (2 - 2023-24 season) 07/05/2022    Colorectal cancer screening: Type of screening: Colonoscopy. Completed 05/26/2015. Repeat every 10 years  Mammogram status: Ordered 11/13/2022. Pt provided with contact info and advised to call to schedule appt.   Bone Density status: Ordered not of age . Pt provided with contact info and advised to call to schedule appt.  Lung Cancer Screening: (Low Dose CT Chest recommended if Age 63-80 years, 30 pack-year currently smoking OR  have quit w/in 15years.) does not qualify.   Lung Cancer Screening Referral: n/a  Additional Screening:  Hepatitis C Screening: does not qualify;   Vision Screening: Recommended annual ophthalmology exams for early detection of glaucoma and other disorders of the eye. Is the patient up to date with their annual eye exam?  Yes  Who is the provider or what is the name of the office in which the patient attends annual eye exams? Dr.Lee  If pt is not established with a provider, would they like to be referred to a provider to establish care? No .   Dental Screening: Recommended annual dental exams for proper oral hygiene  Community Resource Referral / Chronic Care Management: CRR required this visit?  No   CCM required this visit?  No      Plan:     I have  personally reviewed and noted the following in the patient's chart:   Medical and social history Use of alcohol, tobacco or illicit drugs  Current medications and supplements including opioid prescriptions. Patient is not currently taking opioid prescriptions. Functional ability and status Nutritional status Physical activity Advanced directives List of other physicians Hospitalizations, surgeries, and ER visits in previous 12 months Vitals Screenings to include cognitive, depression, and falls Referrals and appointments  In addition, I have reviewed and discussed with patient certain preventive protocols, quality metrics, and best practice recommendations. A written personalized care plan for preventive services as well as general preventive health recommendations were provided to patient.     Lorrene Reid, LPN   [id number]/[id number]   Nurse Notes: Due Flu/TDAP Yevonne Aline Vaccine

## 2022-11-13 NOTE — Patient Instructions (Signed)
[name] , Thank you for taking time to come for your Medicare Wellness Visit. I appreciate your ongoing commitment to your health goals. Please review the following plan we discussed and let me know if I can assist you in the future.   These are the goals we discussed:  Goals      DIET - INCREASE WATER INTAKE        This is a list of the screening recommended for you and due dates:  Health Maintenance  Topic Date Due   Eye exam for diabetics  Never done   DTaP/Tdap/Td vaccine (3 - Td or Tdap) 11/04/2009   Screening for Lung Cancer  Never done   COVID-19 Vaccine (2 - 2023-24 season) 07/05/2022   Flu Shot  02/02/2023*   Hepatitis C Screening: USPSTF Recommendation to screen - Ages 18-79 yo.  04/10/2023*   Mammogram  09/06/2023*   Yearly kidney health urinalysis for diabetes  01/05/2023   Hemoglobin A1C  01/08/2023   Complete foot exam   01/24/2023   Yearly kidney function blood test for diabetes  07/11/2023   Medicare Annual Wellness Visit  11/14/2023   Pap Smear  07/26/2024   Colon Cancer Screening  05/25/2025   HIV Screening  Completed   HPV Vaccine  Aged Out   Zoster (Shingles) Vaccine  Discontinued  *Topic was postponed. The date shown is not the original due date.    Advanced directives: Advance directive discussed with you today. I have provided a copy for you to complete at home and have notarized. Once this is complete please bring a copy in to our office so we can scan it into your chart.   Conditions/risks identified: Aim for 30 minutes of exercise or brisk walking, 6-8 glasses of water, and 5 servings of fruits and vegetables each day.   Next appointment: Follow up in one year for your annual wellness visit   Preventive Care 65 Years and Older, Female Preventive care refers to lifestyle choices and visits with your health care provider that can promote health and wellness. What does preventive care include? A yearly physical exam. This is also called an annual  well check. Dental exams once or twice a year. Routine eye exams. Ask your health care provider how often you should have your eyes checked. Personal lifestyle choices, including: Daily care of your teeth and gums. Regular physical activity. Eating a healthy diet. Avoiding tobacco and drug use. Limiting alcohol use. Practicing safe sex. Taking low-dose aspirin every day. Taking vitamin and mineral supplements as recommended by your health care provider. What happens during an annual well check? The services and screenings done by your health care provider during your annual well check will depend on your age, overall health, lifestyle risk factors, and family history of disease. Counseling  Your health care provider may ask you questions about your: Alcohol use. Tobacco use. Drug use. Emotional well-being. Home and relationship well-being. Sexual activity. Eating habits. History of falls. Memory and ability to understand (cognition). Work and work Statistician. Reproductive health. Screening  You may have the following tests or measurements: Height, weight, and BMI. Blood pressure. Lipid and cholesterol levels. These may be checked every 5 years, or more frequently if you are over 1 years old. Skin check. Lung cancer screening. You may have this screening every year starting at age 65 if you have a 30-pack-year history of smoking and currently smoke or have quit within the past 15 years. Fecal occult blood test (FOBT) of  the stool. You may have this test every year starting at age 60. Flexible sigmoidoscopy or colonoscopy. You may have a sigmoidoscopy every 5 years or a colonoscopy every 10 years starting at age 72. Hepatitis C blood test. Hepatitis B blood test. Sexually transmitted disease (STD) testing. Diabetes screening. This is done by checking your blood sugar (glucose) after you have not eaten for a while (fasting). You may have this done every 1-3 years. Bone density  scan. This is done to screen for osteoporosis. You may have this done starting at age 24. Mammogram. This may be done every 1-2 years. Talk to your health care provider about how often you should have regular mammograms. Talk with your health care provider about your test results, treatment options, and if necessary, the need for more tests. Vaccines  Your health care provider may recommend certain vaccines, such as: Influenza vaccine. This is recommended every year. Tetanus, diphtheria, and acellular pertussis (Tdap, Td) vaccine. You may need a Td booster every 10 years. Zoster vaccine. You may need this after age 33. Pneumococcal 13-valent conjugate (PCV13) vaccine. One dose is recommended after age 33. Pneumococcal polysaccharide (PPSV23) vaccine. One dose is recommended after age 4. Talk to your health care provider about which screenings and vaccines you need and how often you need them. This information is not intended to replace advice given to you by your health care provider. Make sure you discuss any questions you have with your health care provider. Document Released: 11/17/2015 Document Revised: 07/10/2016 Document Reviewed: 08/22/2015 Elsevier Interactive Patient Education  2017 [employer].  Fall Prevention in the Home Falls can cause injuries. They can happen to people of all ages. There are many things you can do to make your home safe and to help prevent falls. What can I do on the outside of my home? Regularly fix the edges of walkways and driveways and fix any cracks. Remove anything that might make you trip as you walk through a door, such as a raised step or threshold. Trim any bushes or trees on the path to your home. Use bright outdoor lighting. Clear any walking paths of anything that might make someone trip, such as rocks or tools. Regularly check to see if handrails are loose or broken. Make sure that both sides of any steps have handrails. Any raised decks and  porches should have guardrails on the edges. Have any leaves, snow, or ice cleared regularly. Use sand or salt on walking paths during winter. Clean up any spills in your garage right away. This includes oil or grease spills. What can I do in the bathroom? Use night lights. Install grab bars by the toilet and in the tub and shower. Do not use towel bars as grab bars. Use non-skid mats or decals in the tub or shower. If you need to sit down in the shower, use a plastic, non-slip stool. Keep the floor dry. Clean up any water that spills on the floor as soon as it happens. Remove soap buildup in the tub or shower regularly. Attach bath mats securely with double-sided non-slip rug tape. Do not have throw rugs and other things on the floor that can make you trip. What can I do in the bedroom? Use night lights. Make sure that you have a light by your bed that is easy to reach. Do not use any sheets or blankets that are too big for your bed. They should not hang down onto the floor. Have a firm chair  that has side arms. You can use this for support while you get dressed. Do not have throw rugs and other things on the floor that can make you trip. What can I do in the kitchen? Clean up any spills right away. Avoid walking on wet floors. Keep items that you use a lot in easy-to-reach places. If you need to reach something above you, use a strong step stool that has a grab bar. Keep electrical cords out of the way. Do not use floor polish or wax that makes floors slippery. If you must use wax, use non-skid floor wax. Do not have throw rugs and other things on the floor that can make you trip. What can I do with my stairs? Do not leave any items on the stairs. Make sure that there are handrails on both sides of the stairs and use them. Fix handrails that are broken or loose. Make sure that handrails are as long as the stairways. Check any carpeting to make sure that it is firmly attached to the  stairs. Fix any carpet that is loose or worn. Avoid having throw rugs at the top or bottom of the stairs. If you do have throw rugs, attach them to the floor with carpet tape. Make sure that you have a light switch at the top of the stairs and the bottom of the stairs. If you do not have them, ask someone to add them for you. What else can I do to help prevent falls? Wear shoes that: Do not have high heels. Have rubber bottoms. Are comfortable and fit you well. Are closed at the toe. Do not wear sandals. If you use a stepladder: Make sure that it is fully opened. Do not climb a closed stepladder. Make sure that both sides of the stepladder are locked into place. Ask someone to hold it for you, if possible. Clearly mark and make sure that you can see: Any grab bars or handrails. First and last steps. Where the edge of each step is. Use tools that help you move around (mobility aids) if they are needed. These include: Canes. Walkers. Scooters. Crutches. Turn on the lights when you go into a dark area. Replace any light bulbs as soon as they burn out. Set up your furniture so you have a clear path. Avoid moving your furniture around. If any of your floors are uneven, fix them. If there are any pets around you, be aware of where they are. Review your medicines with your doctor. Some medicines can make you feel dizzy. This can increase your chance of falling. Ask your doctor what other things that you can do to help prevent falls. This information is not intended to replace advice given to you by your health care provider. Make sure you discuss any questions you have with your health care provider. Document Released: 08/17/2009 Document Revised: 03/28/2016 Document Reviewed: 11/25/2014 Elsevier Interactive Patient Education  2017 [employer].

## 2022-11-25 ENCOUNTER — Ambulatory Visit (INDEPENDENT_AMBULATORY_CARE_PROVIDER_SITE_OTHER): Payer: 59 | Admitting: Family Medicine

## 2022-11-25 ENCOUNTER — Encounter: Payer: Self-pay | Admitting: Family Medicine

## 2022-11-25 VITALS — BP 145/72 | HR 60 | Temp 97.4°F | Ht 64.0 in

## 2022-11-25 DIAGNOSIS — I10 Essential (primary) hypertension: Secondary | ICD-10-CM | POA: Diagnosis not present

## 2022-11-25 DIAGNOSIS — E1165 Type 2 diabetes mellitus with hyperglycemia: Secondary | ICD-10-CM | POA: Diagnosis not present

## 2022-11-25 DIAGNOSIS — E785 Hyperlipidemia, unspecified: Secondary | ICD-10-CM

## 2022-11-25 DIAGNOSIS — I25118 Atherosclerotic heart disease of native coronary artery with other forms of angina pectoris: Secondary | ICD-10-CM | POA: Diagnosis not present

## 2022-11-25 DIAGNOSIS — Z7984 Long term (current) use of oral hypoglycemic drugs: Secondary | ICD-10-CM

## 2022-11-25 LAB — BAYER DCA HB A1C WAIVED: HB A1C (BAYER DCA - WAIVED): 5.8 % — ABNORMAL HIGH (ref 4.8–5.6)

## 2022-11-25 MED ORDER — METOPROLOL TARTRATE 50 MG PO TABS: 50.0000 mg | ORAL_TABLET | Freq: Every day | ORAL | 1 refills | Status: DC

## 2022-11-25 MED ORDER — ATORVASTATIN CALCIUM 80 MG PO TABS: 80.0000 mg | ORAL_TABLET | Freq: Every day | ORAL | 1 refills | Status: DC

## 2022-11-25 MED ORDER — TRULICITY 3 MG/0.5ML ~~LOC~~ SOAJ: 3.0000 mg | SUBCUTANEOUS | 1 refills | Status: DC

## 2022-11-25 MED ORDER — BLOOD GLUCOSE MONITOR KIT: PACK | 0 refills | Status: DC

## 2022-11-25 NOTE — Progress Notes (Signed)
Subjective:  Patient ID: Cindy Garrett,  female    DOB: 02/06/1966  Age: 57 y.o.    CC: Diabetes   HPI TAMME MOZINGO presents for  follow-up of hypertension. Patient has no history of headache chest pain or shortness of breath or recent cough. Patient also denies symptoms of TIA such as numbness weakness lateralizing. Patient denies side effects from medication. States taking it regularly.  Patient also  in for follow-up of elevated cholesterol. Doing well without complaints on current medication. Denies side effects  including myalgia and arthralgia and nausea. Also in today for liver function testing. Currently no chest pain, shortness of breath or other cardiovascular related symptoms noted.  Follow-up of diabetes. Patient does not check blood sugar at home. Monitor broke. Wants a scrip. Patient denies symptoms such as excessive hunger or urinary frequency, excessive hunger, nausea No significant hypoglycemic spells noted. Medications reviewed. Pt reports taking them regularly. Pt. denies complication/adverse reaction today.    History Scotty has a past medical history of Depression, Diabetes mellitus without complication (HCC), Hyperlipidemia, Hypertension, and Stroke (HCC).   She has a past surgical history that includes Tubal ligation; Colonoscopy (N/A, 05/26/2015); LEFT HEART CATH AND CORONARY ANGIOGRAPHY (N/A, 12/10/2016); Coronary artery bypass graft (N/A, 12/10/2016); and TEE without cardioversion (N/A, 12/10/2016).   Her family history includes Diabetes in her father; Heart disease in her mother; Hyperlipidemia in her sister; Hypertension in her sister; Mental illness in her brother.She reports that she quit smoking about 5 years ago. Her smoking use included cigarettes. She has a 30.00 pack-year smoking history. She has never used smokeless tobacco. She reports that she does not currently use alcohol. She reports that she does not use drugs.  Current Outpatient Medications on  File Prior to Visit  Medication Sig Dispense Refill   amLODipine (NORVASC) 10 MG tablet Take 1 tablet (10 mg total) by mouth daily. 90 tablet 3   clopidogrel (PLAVIX) 75 MG tablet TAKE 1 TABLET BY MOUTH DAILY 100 tablet 0   fluticasone (FLONASE) 50 MCG/ACT nasal spray Place 2 sprays into both nostrils daily. 16 g 0   metFORMIN (GLUCOPHAGE-XR) 500 MG 24 hr tablet Take 1 tablet (500 mg total) by mouth 2 (two) times daily with a meal. 200 tablet 1   olmesartan (BENICAR) 40 MG tablet Take 1 tablet (40 mg total) by mouth daily. For blood pressure 90 tablet 1   loratadine (CLARITIN) 10 MG tablet Take 1 tablet (10 mg total) by mouth daily. 30 tablet 0   No current facility-administered medications on file prior to visit.    ROS Review of Systems  Constitutional: Negative.   HENT: Negative.    Eyes:  Negative for visual disturbance.  Respiratory:  Negative for shortness of breath.   Cardiovascular:  Negative for chest pain.  Gastrointestinal:  Negative for abdominal pain.  Musculoskeletal:  Negative for arthralgias.    Objective:  BP (!) 145/72   Pulse 60   Temp (!) 97.4 F (36.3 C)   Ht 5\' 4"  (1.626 m)   LMP 08/03/2013   SpO2 99%   BMI 26.95 kg/m   BP Readings from Last 3 Encounters:  11/25/22 (!) 145/72  09/05/22 (!) 154/67  08/22/22 (!) 170/92    Wt Readings from Last 3 Encounters:  11/13/22 157 lb (71.2 kg)  09/05/22 172 lb 3.2 oz (78.1 kg)  08/22/22 175 lb (79.4 kg)     Physical Exam Constitutional:      General: She is not in  acute distress.    Appearance: She is well-developed.  Cardiovascular:     Rate and Rhythm: Normal rate and regular rhythm.  Pulmonary:     Breath sounds: Normal breath sounds.  Musculoskeletal:        General: Normal range of motion.  Skin:    General: Skin is warm and dry.  Neurological:     Mental Status: She is alert and oriented to person, place, and time.     Diabetic Foot Exam - Simple   No data filed     Lab Results   Component Value Date   HGBA1C 5.9 (H) 07/10/2022   HGBA1C 5.7 (H) 04/09/2022   HGBA1C 7.1 (H) 01/04/2022    Assessment & Plan:   Rayshell was seen today for diabetes.  Diagnoses and all orders for this visit:  Type 2 diabetes mellitus with hyperglycemia, without long-term current use of insulin (HCC) -     Bayer DCA Hb A1c Waived -     Dulaglutide (TRULICITY) 3 YT/0.1SW SOPN; Inject 3 mg into the skin once a week. -     atorvastatin (LIPITOR) 80 MG tablet; Take 1 tablet (80 mg total) by mouth daily.  Accelerated essential hypertension -     CBC with Differential/Platelet -     CMP14+EGFR -     metoprolol tartrate (LOPRESSOR) 50 MG tablet; Take 1 tablet (50 mg total) by mouth daily.  Dyslipidemia -     Lipid panel -     atorvastatin (LIPITOR) 80 MG tablet; Take 1 tablet (80 mg total) by mouth daily.  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  Other orders -     blood glucose meter kit and supplies KIT; Dispense based on patient and insurance preference. Use up to four times daily as directed. (FOR ICD-10 : E11.9   I have changed Clearence Ped. Benedick's atorvastatin. I am also having her start on blood glucose meter kit and supplies. Additionally, I am having her maintain her loratadine, fluticasone, metFORMIN, olmesartan, amLODipine, clopidogrel, metoprolol tartrate, and Trulicity.  Meds ordered this encounter  Medications   blood glucose meter kit and supplies KIT    Sig: Dispense based on patient and insurance preference. Use up to four times daily as directed. (FOR ICD-10 : E11.9    Dispense:  1 each    Refill:  0    Order Specific Question:   Number of strips    Answer:   100    Order Specific Question:   Number of lancets    Answer:   100   metoprolol tartrate (LOPRESSOR) 50 MG tablet    Sig: Take 1 tablet  (50 mg total) by mouth daily.    Dispense:  180 tablet    Refill:  1    Please send a replace/new response with 100-Day Supply if appropriate to maximize member benefit. Requesting 1 year supply.   Dulaglutide (TRULICITY) 3 FU/9.3AT SOPN    Sig: Inject 3 mg into the skin once a week.    Dispense:  6 mL    Refill:  1   atorvastatin (LIPITOR) 80 MG tablet    Sig: Take 1 tablet (80 mg total) by mouth daily.    Dispense:  90 tablet    Refill:  1    Requesting 1 year supply  Follow-up: Return in about 3 months (around 02/24/2023).  Claretta Fraise, M.D.

## 2022-11-26 LAB — CMP14+EGFR
ALT: 12 IU/L (ref 0–32)
AST: 17 IU/L (ref 0–40)
Albumin/Globulin Ratio: 1.7 (ref 1.2–2.2)
Albumin: 4.4 g/dL (ref 3.8–4.9)
Alkaline Phosphatase: 102 IU/L (ref 44–121)
BUN/Creatinine Ratio: 16 (ref 9–23)
BUN: 21 mg/dL (ref 6–24)
Bilirubin Total: <0.2 mg/dL (ref 0.0–1.2)
CO2: 21 mmol/L (ref 20–29)
Calcium: 9.7 mg/dL (ref 8.7–10.2)
Chloride: 104 mmol/L (ref 96–106)
Creatinine, Ser: 1.32 mg/dL — ABNORMAL HIGH (ref 0.57–1.00)
Globulin, Total: 2.6 g/dL (ref 1.5–4.5)
Glucose: 97 mg/dL (ref 70–99)
Potassium: 4.4 mmol/L (ref 3.5–5.2)
Sodium: 142 mmol/L (ref 134–144)
Total Protein: 7 g/dL (ref 6.0–8.5)
eGFR: 47 mL/min/1.73 — ABNORMAL LOW

## 2022-11-26 LAB — LIPID PANEL
Chol/HDL Ratio: 3.4 ratio (ref 0.0–4.4)
Cholesterol, Total: 115 mg/dL (ref 100–199)
HDL: 34 mg/dL — ABNORMAL LOW
LDL Chol Calc (NIH): 58 mg/dL (ref 0–99)
Triglycerides: 127 mg/dL (ref 0–149)
VLDL Cholesterol Cal: 23 mg/dL (ref 5–40)

## 2022-11-26 LAB — CBC WITH DIFFERENTIAL/PLATELET
Basophils Absolute: 0.1 x10E3/uL (ref 0.0–0.2)
Basos: 1 %
EOS (ABSOLUTE): 0.4 x10E3/uL (ref 0.0–0.4)
Eos: 6 %
Hematocrit: 37.6 % (ref 34.0–46.6)
Hemoglobin: 12.9 g/dL (ref 11.1–15.9)
Immature Grans (Abs): 0 x10E3/uL (ref 0.0–0.1)
Immature Granulocytes: 0 %
Lymphocytes Absolute: 2.1 x10E3/uL (ref 0.7–3.1)
Lymphs: 33 %
MCH: 29.8 pg (ref 26.6–33.0)
MCHC: 34.3 g/dL (ref 31.5–35.7)
MCV: 87 fL (ref 79–97)
Monocytes Absolute: 0.5 x10E3/uL (ref 0.1–0.9)
Monocytes: 8 %
Neutrophils Absolute: 3.4 x10E3/uL (ref 1.4–7.0)
Neutrophils: 52 %
Platelets: 414 x10E3/uL (ref 150–450)
RBC: 4.33 x10E6/uL (ref 3.77–5.28)
RDW: 12.1 % (ref 11.7–15.4)
WBC: 6.5 x10E3/uL (ref 3.4–10.8)

## 2022-11-26 NOTE — Progress Notes (Signed)
Hello Suzane,  Your lab result is normal and/or stable.Some minor variations that are not significant are commonly marked abnormal, but do not represent any medical problem for you.  Best regards, Alylah Blakney, M.D.

## 2022-12-09 ENCOUNTER — Inpatient Hospital Stay: Admission: RE | Admit: 2022-12-09 | Payer: 59 | Source: Ambulatory Visit

## 2022-12-18 ENCOUNTER — Other Ambulatory Visit: Payer: Self-pay | Admitting: Family Medicine

## 2022-12-18 DIAGNOSIS — E1165 Type 2 diabetes mellitus with hyperglycemia: Secondary | ICD-10-CM

## 2022-12-30 ENCOUNTER — Other Ambulatory Visit: Payer: Self-pay | Admitting: Family Medicine

## 2022-12-30 DIAGNOSIS — I10 Essential (primary) hypertension: Secondary | ICD-10-CM

## 2023-01-02 ENCOUNTER — Other Ambulatory Visit: Payer: Self-pay | Admitting: Family Medicine

## 2023-01-02 DIAGNOSIS — E1165 Type 2 diabetes mellitus with hyperglycemia: Secondary | ICD-10-CM

## 2023-01-06 ENCOUNTER — Ambulatory Visit
Admission: RE | Admit: 2023-01-06 | Discharge: 2023-01-06 | Disposition: A | Discharge status: Home / Self Care | Payer: 59 | Source: Ambulatory Visit | Attending: Family Medicine | Admitting: Family Medicine

## 2023-01-06 DIAGNOSIS — Z1231 Encounter for screening mammogram for malignant neoplasm of breast: Secondary | ICD-10-CM

## 2023-01-17 ENCOUNTER — Other Ambulatory Visit: Payer: Self-pay | Admitting: Family Medicine

## 2023-01-17 DIAGNOSIS — Z8673 Personal history of transient ischemic attack (TIA), and cerebral infarction without residual deficits: Secondary | ICD-10-CM

## 2023-02-06 LAB — HM DIABETES EYE EXAM

## 2023-02-24 ENCOUNTER — Encounter: Payer: Self-pay | Admitting: Family Medicine

## 2023-02-24 ENCOUNTER — Ambulatory Visit (INDEPENDENT_AMBULATORY_CARE_PROVIDER_SITE_OTHER): Payer: 59 | Admitting: Family Medicine

## 2023-02-24 VITALS — BP 151/80 | HR 68 | Temp 97.7°F | Ht 64.0 in | Wt 178.8 lb

## 2023-02-24 DIAGNOSIS — E785 Hyperlipidemia, unspecified: Secondary | ICD-10-CM

## 2023-02-24 DIAGNOSIS — E1165 Type 2 diabetes mellitus with hyperglycemia: Secondary | ICD-10-CM | POA: Diagnosis not present

## 2023-02-24 DIAGNOSIS — I10 Essential (primary) hypertension: Secondary | ICD-10-CM

## 2023-02-24 DIAGNOSIS — F17210 Nicotine dependence, cigarettes, uncomplicated: Secondary | ICD-10-CM | POA: Diagnosis not present

## 2023-02-24 LAB — CMP14+EGFR
ALT: 26 IU/L (ref 0–32)
AST: 38 IU/L (ref 0–40)
Albumin/Globulin Ratio: 1.8 (ref 1.2–2.2)
Albumin: 4.1 g/dL (ref 3.8–4.9)
Alkaline Phosphatase: 113 IU/L (ref 44–121)
BUN/Creatinine Ratio: 11 (ref 9–23)
BUN: 9 mg/dL (ref 6–24)
Bilirubin Total: 0.4 mg/dL (ref 0.0–1.2)
CO2: 22 mmol/L (ref 20–29)
Calcium: 9.6 mg/dL (ref 8.7–10.2)
Chloride: 106 mmol/L (ref 96–106)
Creatinine, Ser: 0.82 mg/dL (ref 0.57–1.00)
Globulin, Total: 2.3 g/dL (ref 1.5–4.5)
Glucose: 135 mg/dL — ABNORMAL HIGH (ref 70–99)
Potassium: 4.4 mmol/L (ref 3.5–5.2)
Sodium: 143 mmol/L (ref 134–144)
Total Protein: 6.4 g/dL (ref 6.0–8.5)
eGFR: 83 mL/min/{1.73_m2} (ref >59)

## 2023-02-24 LAB — LIPID PANEL
Chol/HDL Ratio: 3.1 ratio (ref 0.0–4.4)
Cholesterol, Total: 129 mg/dL (ref 100–199)
HDL: 42 mg/dL (ref >39)
LDL Chol Calc (NIH): 64 mg/dL (ref 0–99)
Triglycerides: 133 mg/dL (ref 0–149)
VLDL Cholesterol Cal: 23 mg/dL (ref 5–40)

## 2023-02-24 LAB — CBC WITH DIFFERENTIAL/PLATELET
Basophils Absolute: 0 10*3/uL (ref 0.0–0.2)
Basos: 1 %
EOS (ABSOLUTE): 0.4 10*3/uL (ref 0.0–0.4)
Eos: 7 %
Hematocrit: 33.2 % — ABNORMAL LOW (ref 34.0–46.6)
Hemoglobin: 11.2 g/dL (ref 11.1–15.9)
Immature Grans (Abs): 0 10*3/uL (ref 0.0–0.1)
Immature Granulocytes: 0 %
Lymphocytes Absolute: 1.6 10*3/uL (ref 0.7–3.1)
Lymphs: 23 %
MCH: 29.5 pg (ref 26.6–33.0)
MCHC: 33.7 g/dL (ref 31.5–35.7)
MCV: 87 fL (ref 79–97)
Monocytes Absolute: 0.5 10*3/uL (ref 0.1–0.9)
Monocytes: 7 %
Neutrophils Absolute: 4.2 10*3/uL (ref 1.4–7.0)
Neutrophils: 62 %
Platelets: 453 10*3/uL — ABNORMAL HIGH (ref 150–450)
RBC: 3.8 x10E6/uL (ref 3.77–5.28)
RDW: 12.7 % (ref 11.7–15.4)
WBC: 6.7 10*3/uL (ref 3.4–10.8)

## 2023-02-24 LAB — BAYER DCA HB A1C WAIVED: HB A1C (BAYER DCA - WAIVED): 6 % — ABNORMAL HIGH (ref 4.8–5.6)

## 2023-02-24 MED ORDER — SULFAMETHOXAZOLE-TRIMETHOPRIM 800-160 MG PO TABS: 1.0000 | ORAL_TABLET | Freq: Two times a day (BID) | ORAL | 0 refills | Status: DC

## 2023-02-24 MED ORDER — OLMESARTAN MEDOXOMIL 40 MG PO TABS: 40.0000 mg | ORAL_TABLET | Freq: Every day | ORAL | 3 refills | Status: DC

## 2023-02-24 MED ORDER — PSEUDOEPHEDRINE-GUAIFENESIN ER 120-1200 MG PO TB12: 1.0000 | ORAL_TABLET | Freq: Two times a day (BID) | ORAL | 0 refills | Status: DC

## 2023-02-24 NOTE — Patient Instructions (Signed)

## 2023-02-24 NOTE — Progress Notes (Addendum)
Subjective:  Patient ID: Cindy Garrett,  female    DOB: 10/01/1966  Age: 57 y.o.    CC: Medical Management of Chronic Issues   HPI CHARNESE FEDERICI presents for  follow-up of hypertension. Patient has no history of headache chest pain or shortness of breath or recent cough. Patient also denies symptoms of TIA such as numbness weakness lateralizing. Patient denies side effects from medication. States taking it regularly.Smoking 2-3 cigarettes a day.   Patient also  in for follow-up of elevated cholesterol. Doing well without complaints on current medication. Denies side effects  including myalgia and arthralgia and nausea. Also in today for liver function testing. Currently no chest pain, shortness of breath or other cardiovascular related symptoms noted.  Follow-up of diabetes. Patient does not check blood sugar at home. Just shown how to use her new monitor Patient denies symptoms such as excessive hunger or urinary frequency, excessive hunger, nausea No significant hypoglycemic spells noted. Medications reviewed. Pt reports taking them regularly. Pt. denies complication/adverse reaction today.    History Reyonna has a past medical history of Depression, Diabetes mellitus without complication, Hyperlipidemia, Hypertension, and Stroke.   She has a past surgical history that includes Tubal ligation; Colonoscopy (N/A, 05/26/2015); LEFT HEART CATH AND CORONARY ANGIOGRAPHY (N/A, 12/10/2016); Coronary artery bypass graft (N/A, 12/10/2016); and TEE without cardioversion (N/A, 12/10/2016).   Her family history includes Diabetes in her father; Heart disease in her mother; Hyperlipidemia in her sister; Hypertension in her sister; Mental illness in her brother.She reports that she quit smoking about 6 years ago. Her smoking use included cigarettes. She has a 30.00 pack-year smoking history. She has never used smokeless tobacco. She reports that she does not currently use alcohol. She reports that she does  not use drugs.  Current Outpatient Medications on File Prior to Visit  Medication Sig Dispense Refill   amLODipine (NORVASC) 10 MG tablet Take 1 tablet (10 mg total) by mouth daily. 90 tablet 3   atorvastatin (LIPITOR) 80 MG tablet Take 1 tablet (80 mg total) by mouth daily. 90 tablet 1   blood glucose meter kit and supplies KIT Dispense based on patient and insurance preference. Use up to four times daily as directed. (FOR ICD-10 : E11.9 1 each 0   clopidogrel (PLAVIX) 75 MG tablet TAKE 1 TABLET BY MOUTH DAILY 100 tablet 0   Dulaglutide (TRULICITY) 3 MG/0.5ML SOPN INJECT THE CONTENTS OF ONE PEN  SUBCUTANEOUSLY WEEKLY AS  DIRECTED 6 mL 0   fluticasone (FLONASE) 50 MCG/ACT nasal spray Place 2 sprays into both nostrils daily. 16 g 0   metFORMIN (GLUCOPHAGE-XR) 500 MG 24 hr tablet TAKE 1 TABLET BY MOUTH TWICE  DAILY WITH MEALS 200 tablet 0   metoprolol tartrate (LOPRESSOR) 50 MG tablet Take 1 tablet (50 mg total) by mouth daily. 180 tablet 1   loratadine (CLARITIN) 10 MG tablet Take 1 tablet (10 mg total) by mouth daily. 30 tablet 0   No current facility-administered medications on file prior to visit.    ROS Review of Systems  Constitutional:  Negative for activity change, appetite change, chills and fever.  HENT:  Positive for congestion, postnasal drip, rhinorrhea, sinus pressure and sneezing. Negative for ear discharge, ear pain, hearing loss, nosebleeds and trouble swallowing.   Respiratory:  Positive for cough. Negative for chest tightness and shortness of breath.   Cardiovascular:  Negative for chest pain and palpitations.  Skin:  Negative for rash.    Objective:  BP (!) 151/80  Pulse 68   Temp 97.7 F (36.5 C)   Ht 5\' 4"  (1.626 m)   Wt 178 lb 12.8 oz (81.1 kg)   LMP 08/03/2013   SpO2 99%   BMI 30.69 kg/m   BP Readings from Last 3 Encounters:  02/24/23 (!) 151/80  11/25/22 (!) 145/72  09/05/22 (!) 154/67    Wt Readings from Last 3 Encounters:  02/24/23 178 lb 12.8 oz  (81.1 kg)  11/13/22 157 lb (71.2 kg)  09/05/22 172 lb 3.2 oz (78.1 kg)     Physical Exam Constitutional:      General: She is not in acute distress.    Appearance: She is well-developed.  HENT:     Head: Normocephalic and atraumatic.     Right Ear: External ear normal.     Left Ear: External ear normal.     Nose: Nose normal.  Eyes:     Conjunctiva/sclera: Conjunctivae normal.     Pupils: Pupils are equal, round, and reactive to light.  Neck:     Thyroid: No thyromegaly.  Cardiovascular:     Rate and Rhythm: Normal rate and regular rhythm.     Heart sounds: Normal heart sounds. No murmur heard. Pulmonary:     Effort: Pulmonary effort is normal. No respiratory distress.     Breath sounds: Normal breath sounds. No wheezing or rales.  Abdominal:     General: Bowel sounds are normal. There is no distension.     Palpations: Abdomen is soft.     Tenderness: There is no abdominal tenderness.  Musculoskeletal:        General: Normal range of motion.     Cervical back: Normal range of motion and neck supple.  Lymphadenopathy:     Cervical: No cervical adenopathy.  Skin:    General: Skin is warm and dry.  Neurological:     Mental Status: She is alert and oriented to person, place, and time.     Deep Tendon Reflexes: Reflexes are normal and symmetric.  Psychiatric:        Behavior: Behavior normal.        Thought Content: Thought content normal.        Judgment: Judgment normal.     Diabetic Foot Exam - Simple   Simple Foot Form Diabetic Foot exam was performed with the following findings: Yes 02/24/2023  8:27 AM  Visual Inspection No deformities, no ulcerations, no other skin breakdown bilaterally: Yes Sensation Testing Intact to touch and monofilament testing bilaterally: Yes Pulse Check Posterior Tibialis and Dorsalis pulse intact bilaterally: Yes Comments Onychoolysis of toenails      Lab Results  Component Value Date   HGBA1C 5.8 (H) 11/25/2022   HGBA1C 5.9  (H) 07/10/2022   HGBA1C 5.7 (H) 04/09/2022    Assessment & Plan:   Jennilyn was seen today for medical management of chronic issues.  Diagnoses and all orders for this visit:  Type 2 diabetes mellitus with hyperglycemia, without long-term current use of insulin -     Bayer DCA Hb A1c Waived -     Microalbumin / creatinine urine ratio  Accelerated essential hypertension -     CBC with Differential/Platelet -     CMP14+EGFR  Dyslipidemia -     Lipid panel  Other orders -     olmesartan (BENICAR) 40 MG tablet; Take 1 tablet (40 mg total) by mouth daily. For blood pressure -     Pseudoephedrine-Guaifenesin [id number] MG TB12; Take 1 tablet by mouth 2 (  two) times daily. For congestion -     sulfamethoxazole-trimethoprim (BACTRIM DS) 800-160 MG tablet; Take 1 tablet by mouth 2 (two) times daily. Until gone, for infection   I am having [place] Ament. Provencher start on Pseudoephedrine-Guaifenesin and sulfamethoxazole-trimethoprim. I am also having her maintain her loratadine, fluticasone, amLODipine, blood glucose meter kit and supplies, metoprolol tartrate, atorvastatin, Trulicity, metFORMIN, clopidogrel, and olmesartan.  Meds ordered this encounter  Medications   olmesartan (BENICAR) 40 MG tablet    Sig: Take 1 tablet (40 mg total) by mouth daily. For blood pressure    Dispense:  90 tablet    Refill:  3   Pseudoephedrine-Guaifenesin [phone] MG TB12    Sig: Take 1 tablet by mouth 2 (two) times daily. For congestion    Dispense:  14 tablet    Refill:  0   sulfamethoxazole-trimethoprim (BACTRIM DS) 800-160 MG tablet    Sig: Take 1 tablet by mouth 2 (two) times daily. Until gone, for infection    Dispense:  20 tablet    Refill:  0     Follow-up: Return in about 3 months (around 05/26/2023).  Mechele Claude, M.D.

## 2023-02-25 LAB — MICROALBUMIN / CREATININE URINE RATIO
Creatinine, Urine: 75.8 mg/dL
Microalb/Creat Ratio: 87 mg/g creat — ABNORMAL HIGH (ref 0–29)
Microalbumin, Urine: 66 ug/mL

## 2023-02-25 NOTE — Progress Notes (Signed)
Hello Susen,  Your lab result is normal and/or stable.Some minor variations that are not significant are commonly marked abnormal, but do not represent any medical problem for you.  Best regards, Caryle Helgeson, M.D.

## 2023-03-11 ENCOUNTER — Ambulatory Visit: Payer: 59

## 2023-03-11 VITALS — BP 176/87 | HR 70

## 2023-03-11 DIAGNOSIS — Z0130 Encounter for examination of blood pressure without abnormal findings: Secondary | ICD-10-CM

## 2023-03-11 NOTE — Progress Notes (Signed)
Patient here today for blood pressure check Blood pressure 151/88, pulse 70  Patient sat for a few minutes and blood pressure was rechecked at 176/87.  Patient has a follow up appointment in July, will  monitor blood pressure at home and bring log of readings to visit.

## 2023-04-02 ENCOUNTER — Other Ambulatory Visit: Payer: Self-pay | Admitting: Family Medicine

## 2023-04-02 DIAGNOSIS — E1165 Type 2 diabetes mellitus with hyperglycemia: Secondary | ICD-10-CM

## 2023-04-02 DIAGNOSIS — Z8673 Personal history of transient ischemic attack (TIA), and cerebral infarction without residual deficits: Secondary | ICD-10-CM

## 2023-05-05 ENCOUNTER — Telehealth: Payer: Self-pay | Admitting: Family Medicine

## 2023-05-05 DIAGNOSIS — E1165 Type 2 diabetes mellitus with hyperglycemia: Secondary | ICD-10-CM

## 2023-05-05 NOTE — Telephone Encounter (Signed)
  Prescription Request  05/05/2023  Is this a "Controlled Substance" medicine? Dulaglutide (TRULICITY) 3 MG/0.5ML SOPN   Have you seen your PCP in the last 2 weeks? no  If YES, route message to pool  -  If NO, patient needs to be scheduled for appointment.  What is the name of the medication or equipment? Dulaglutide (TRULICITY) 3 MG/0.5ML SOPN   Have you contacted your pharmacy to request a refill? no   Which pharmacy would you like this sent to? [facility]   Patient notified that their request is being sent to the clinical staff for review and that they should receive a response within 2 business days.

## 2023-05-06 NOTE — Telephone Encounter (Signed)
NA/VM full x 2, RF was sent to [facility] mail order on 04/02/23 for 3 mos RF, wanting to make sure she received refill before sending to local pharmacy.

## 2023-05-26 ENCOUNTER — Ambulatory Visit: Payer: PRIVATE HEALTH INSURANCE | Admitting: Family Medicine

## 2023-05-26 ENCOUNTER — Encounter: Payer: Self-pay | Admitting: Family Medicine

## 2023-05-26 VITALS — BP 170/72 | HR 57 | Temp 98.1°F | Ht 64.0 in | Wt 181.4 lb

## 2023-05-26 DIAGNOSIS — F17210 Nicotine dependence, cigarettes, uncomplicated: Secondary | ICD-10-CM | POA: Insufficient documentation

## 2023-05-26 DIAGNOSIS — E1165 Type 2 diabetes mellitus with hyperglycemia: Secondary | ICD-10-CM

## 2023-05-26 DIAGNOSIS — Z7985 Long-term (current) use of injectable non-insulin antidiabetic drugs: Secondary | ICD-10-CM | POA: Diagnosis not present

## 2023-05-26 DIAGNOSIS — I10 Essential (primary) hypertension: Secondary | ICD-10-CM

## 2023-05-26 DIAGNOSIS — I25118 Atherosclerotic heart disease of native coronary artery with other forms of angina pectoris: Secondary | ICD-10-CM | POA: Diagnosis not present

## 2023-05-26 DIAGNOSIS — Z8673 Personal history of transient ischemic attack (TIA), and cerebral infarction without residual deficits: Secondary | ICD-10-CM

## 2023-05-26 DIAGNOSIS — Z7984 Long term (current) use of oral hypoglycemic drugs: Secondary | ICD-10-CM | POA: Diagnosis not present

## 2023-05-26 DIAGNOSIS — E785 Hyperlipidemia, unspecified: Secondary | ICD-10-CM

## 2023-05-26 LAB — CBC WITH DIFFERENTIAL/PLATELET
Basophils Absolute: 0.1 10*3/uL (ref 0.0–0.2)
Basos: 1 %
EOS (ABSOLUTE): 0.4 10*3/uL (ref 0.0–0.4)
Eos: 7 %
Hematocrit: 34 % (ref 34.0–46.6)
Hemoglobin: 11.6 g/dL (ref 11.1–15.9)
Immature Grans (Abs): 0 10*3/uL (ref 0.0–0.1)
Immature Granulocytes: 0 %
Lymphocytes Absolute: 1.5 10*3/uL (ref 0.7–3.1)
Lymphs: 24 %
MCH: 29.6 pg (ref 26.6–33.0)
MCHC: 34.1 g/dL (ref 31.5–35.7)
MCV: 87 fL (ref 79–97)
Monocytes Absolute: 0.5 10*3/uL (ref 0.1–0.9)
Monocytes: 8 %
Neutrophils Absolute: 3.9 10*3/uL (ref 1.4–7.0)
Neutrophils: 60 %
Platelets: 388 10*3/uL (ref 150–450)
RBC: 3.92 x10E6/uL (ref 3.77–5.28)
RDW: 12.9 % (ref 11.7–15.4)
WBC: 6.4 10*3/uL (ref 3.4–10.8)

## 2023-05-26 LAB — CMP14+EGFR
ALT: 15 IU/L (ref 0–32)
AST: 16 IU/L (ref 0–40)
Albumin: 4.2 g/dL (ref 3.8–4.9)
Alkaline Phosphatase: 119 IU/L (ref 44–121)
BUN/Creatinine Ratio: 10 (ref 9–23)
BUN: 10 mg/dL (ref 6–24)
Bilirubin Total: 0.4 mg/dL (ref 0.0–1.2)
CO2: 24 mmol/L (ref 20–29)
Calcium: 9.8 mg/dL (ref 8.7–10.2)
Chloride: 104 mmol/L (ref 96–106)
Creatinine, Ser: 0.98 mg/dL (ref 0.57–1.00)
Globulin, Total: 2.3 g/dL (ref 1.5–4.5)
Glucose: 140 mg/dL — ABNORMAL HIGH (ref 70–99)
Potassium: 4.6 mmol/L (ref 3.5–5.2)
Sodium: 141 mmol/L (ref 134–144)
Total Protein: 6.5 g/dL (ref 6.0–8.5)
eGFR: 67 mL/min/{1.73_m2} (ref >59)

## 2023-05-26 LAB — LIPID PANEL
Chol/HDL Ratio: 3.1 ratio (ref 0.0–4.4)
Cholesterol, Total: 132 mg/dL (ref 100–199)
HDL: 42 mg/dL (ref >39)
LDL Chol Calc (NIH): 65 mg/dL (ref 0–99)
Triglycerides: 144 mg/dL (ref 0–149)
VLDL Cholesterol Cal: 25 mg/dL (ref 5–40)

## 2023-05-26 LAB — BAYER DCA HB A1C WAIVED: HB A1C (BAYER DCA - WAIVED): 6 % — ABNORMAL HIGH (ref 4.8–5.6)

## 2023-05-26 MED ORDER — TRULICITY 3 MG/0.5ML ~~LOC~~ SOAJ
3.0000 mg | SUBCUTANEOUS | 3 refills | Status: DC
Start: 2023-05-26 — End: 2023-06-19

## 2023-05-26 MED ORDER — ATORVASTATIN CALCIUM 80 MG PO TABS
80.0000 mg | ORAL_TABLET | Freq: Every day | ORAL | 2 refills | Status: DC
Start: 2023-05-26 — End: 2023-05-30

## 2023-05-26 MED ORDER — CLOPIDOGREL BISULFATE 75 MG PO TABS: 75.0000 mg | ORAL_TABLET | Freq: Every day | ORAL | 2 refills | Status: DC

## 2023-05-26 MED ORDER — METFORMIN HCL ER 500 MG PO TB24
500.0000 mg | ORAL_TABLET | Freq: Two times a day (BID) | ORAL | 2 refills | Status: DC
Start: 2023-05-26 — End: 2024-02-17

## 2023-05-26 MED ORDER — AMLODIPINE BESYLATE 10 MG PO TABS
10.0000 mg | ORAL_TABLET | Freq: Every day | ORAL | 3 refills | Status: DC
Start: 2023-05-26 — End: 2023-05-30

## 2023-05-26 MED ORDER — OLMESARTAN MEDOXOMIL 40 MG PO TABS: 40.0000 mg | ORAL_TABLET | Freq: Every day | ORAL | 3 refills | Status: DC

## 2023-05-26 MED ORDER — METOPROLOL TARTRATE 50 MG PO TABS: 50.0000 mg | ORAL_TABLET | Freq: Every day | ORAL | 2 refills | Status: DC

## 2023-05-26 MED ORDER — LANCET DEVICE MISC: 2.0000 [IU] | Freq: Two times a day (BID) | 99 refills | Status: AC

## 2023-05-26 NOTE — Progress Notes (Signed)
Subjective:  Patient ID: Cindy Garrett,  female    DOB: 1966/01/28  Age: 57 y.o.    CC: Medical Management of Chronic Issues   HPI Cindy Garrett presents for  follow-up of hypertension. Patient has no history of headache chest pain or shortness of breath or recent cough. Patient also denies symptoms of TIA such as numbness weakness lateralizing. Patient denies side effects from medication. States taking it regularly.  Patient also  in for follow-up of elevated cholesterol. Doing well without complaints on current medication. Denies side effects  including myalgia and arthralgia and nausea. Also in today for liver function testing. Currently no chest pain, shortness of breath or other cardiovascular related symptoms noted.  Follow-up of diabetes. Patient does not check blood sugar at home. The lancet device was broken or defective so Cindy Garrett never did a finger stick after getting it. Patient denies symptoms such as excessive hunger or urinary frequency, excessive hunger, nausea No significant hypoglycemic spells noted. Medications reviewed. Pt reports taking them regularly. Pt. denies complication/adverse reaction today.    History Cindy Garrett has a past medical history of Depression, Diabetes mellitus without complication (HCC), Hyperlipidemia, Hypertension, and Stroke (HCC).   Cindy Garrett has a past surgical history that includes Tubal ligation; Colonoscopy (N/A, 05/26/2015); LEFT HEART CATH AND CORONARY ANGIOGRAPHY (N/A, 12/10/2016); Coronary artery bypass graft (N/A, 12/10/2016); and TEE without cardioversion (N/A, 12/10/2016).   Her family history includes Diabetes in her father; Heart disease in her mother; Hyperlipidemia in her sister; Hypertension in her sister; Mental illness in her brother.Cindy Garrett reports that Cindy Garrett quit smoking about 6 years ago. Her smoking use included cigarettes. Cindy Garrett started smoking about 36 years ago. Cindy Garrett has a 30 pack-year smoking history. Cindy Garrett has never used smokeless tobacco. Cindy Garrett  reports that Cindy Garrett does not currently use alcohol. Cindy Garrett reports that Cindy Garrett does not use drugs.  Current Outpatient Medications on File Prior to Visit  Medication Sig Dispense Refill   blood glucose meter kit and supplies KIT Dispense based on patient and insurance preference. Use up to four times daily as directed. (FOR ICD-10 : E11.9 1 each 0   fluticasone (FLONASE) 50 MCG/ACT nasal spray Place 2 sprays into both nostrils daily. 16 g 0   loratadine (CLARITIN) 10 MG tablet Take 1 tablet (10 mg total) by mouth daily. 30 tablet 0   No current facility-administered medications on file prior to visit.    ROS Review of Systems  Constitutional: Negative.   HENT: Negative.    Eyes:  Negative for visual disturbance.  Respiratory:  Negative for shortness of breath.   Cardiovascular:  Negative for chest pain.  Gastrointestinal:  Negative for abdominal pain.  Musculoskeletal:  Negative for arthralgias.    Objective:  BP (!) 170/72   Pulse (!) 57   Temp 98.1 F (36.7 C)   Ht 5\' 4"  (1.626 m)   Wt 181 lb 6.4 oz (82.3 kg)   LMP 08/03/2013   SpO2 97%   BMI 31.14 kg/m   BP Readings from Last 3 Encounters:  05/26/23 (!) 170/72  03/11/23 (!) 176/87  02/24/23 (!) 151/80    Wt Readings from Last 3 Encounters:  05/26/23 181 lb 6.4 oz (82.3 kg)  02/24/23 178 lb 12.8 oz (81.1 kg)  11/13/22 157 lb (71.2 kg)     Physical Exam Constitutional:      General: Cindy Garrett is not in acute distress.    Appearance: Cindy Garrett is well-developed.  Cardiovascular:     Rate and Rhythm: Normal  rate and regular rhythm.  Pulmonary:     Breath sounds: Normal breath sounds.  Musculoskeletal:        General: Normal range of motion.  Skin:    General: Skin is warm and dry.  Neurological:     Mental Status: Cindy Garrett is alert and oriented to person, place, and time.     Diabetic Foot Exam - Simple   No data filed     Lab Results  Component Value Date   HGBA1C 6.0 (H) 02/24/2023   HGBA1C 5.8 (H) 11/25/2022   HGBA1C  5.9 (H) 07/10/2022    Assessment & Plan:   Cindy Garrett was seen today for medical management of chronic issues.  Diagnoses and all orders for this visit:  Type 2 diabetes mellitus with hyperglycemia, without long-term current use of insulin (HCC) -     Bayer DCA Hb A1c Waived -     atorvastatin (LIPITOR) 80 MG tablet; Take 1 tablet (80 mg total) by mouth daily. -     metFORMIN (GLUCOPHAGE-XR) 500 MG 24 hr tablet; Take 1 tablet (500 mg total) by mouth 2 (two) times daily with a meal. -     Dulaglutide (TRULICITY) 3 MG/0.5ML SOPN; Inject 3 mg into the skin every 7 (seven) days. INJECT THE CONTENTS OF ONE PEN  SUBCUTANEOUSLY WEEKLY AS  DIRECTED  Accelerated essential hypertension -     CBC with Differential/Platelet -     CMP14+EGFR -     metoprolol tartrate (LOPRESSOR) 50 MG tablet; Take 1 tablet (50 mg total) by mouth daily.  Dyslipidemia -     Lipid panel -     atorvastatin (LIPITOR) 80 MG tablet; Take 1 tablet (80 mg total) by mouth daily.  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  History of CVA (cerebrovascular accident) -     clopidogrel (PLAVIX) 75 MG tablet; Take 1 tablet (75 mg total) by mouth daily.  Essential (primary) hypertension -     amLODipine (NORVASC) 10 MG tablet; Take 1 tablet (10 mg total) by mouth daily.  Smoking greater than 20 pack years -     Ambulatory Referral for Lung Cancer Scre  Other orders -     Lancet Device MISC; 2 Units by Does not apply route in the morning and at bedtime. -     olmesartan (BENICAR) 40 MG tablet; Take 1 tablet (40 mg total) by mouth daily. For blood pressure   I have discontinued Edwena Felty. Turner's Pseudoephedrine-Guaifenesin and sulfamethoxazole-trimethoprim. I have also changed her clopidogrel, metFORMIN, and Trulicity. Additionally, I am having her start on  Lancet Device. Lastly, I am having her maintain her loratadine, fluticasone, blood glucose meter kit and supplies, atorvastatin, metoprolol tartrate, amLODipine, and olmesartan.  Meds ordered this encounter  Medications   atorvastatin (LIPITOR) 80 MG tablet    Sig: Take 1 tablet (80 mg total) by mouth daily.    Dispense:  100 tablet    Refill:  2    Requesting 1 year supply   clopidogrel (PLAVIX) 75 MG tablet    Sig: Take 1 tablet (75 mg total) by mouth daily.    Dispense:  100 tablet    Refill:  2    Please send a replace/new response with 100-Day Supply if appropriate to maximize member benefit. Requesting 1 year supply.   metFORMIN (  GLUCOPHAGE-XR) 500 MG 24 hr tablet    Sig: Take 1 tablet (500 mg total) by mouth 2 (two) times daily with a meal.    Dispense:  200 tablet    Refill:  2    Please send a replace/new response with 100-Day Supply if appropriate to maximize member benefit.   metoprolol tartrate (LOPRESSOR) 50 MG tablet    Sig: Take 1 tablet (50 mg total) by mouth daily.    Dispense:  200 tablet    Refill:  2    Please send a replace/new response with 100-Day Supply if appropriate to maximize member benefit. Requesting 1 year supply.   Lancet Device MISC    Sig: 2 Units by Does not apply route in the morning and at bedtime.    Dispense:  1 each    Refill:  PRN   amLODipine (NORVASC) 10 MG tablet    Sig: Take 1 tablet (10 mg total) by mouth daily.    Dispense:  90 tablet    Refill:  3   olmesartan (BENICAR) 40 MG tablet    Sig: Take 1 tablet (40 mg total) by mouth daily. For blood pressure    Dispense:  90 tablet    Refill:  3   Dulaglutide (TRULICITY) 3 MG/0.5ML SOPN    Sig: Inject 3 mg into the skin every 7 (seven) days. INJECT THE CONTENTS OF ONE PEN  SUBCUTANEOUSLY WEEKLY AS  DIRECTED    Dispense:  6 mL    Refill:  3    Requesting 1 year supply   Weight gain noted. Slower recently. Planning exercise program at the gym near her home.   Pharmacy info for BP  meds indicates no fills of the amlodipine, 38% on metoprolol and 50% on Benicar. We discussed the need to take these medications daily, especially since Cindy Garrett has known heart disease and hx of CABG.  Follow-up: Return in about 3 months (around 08/26/2023).  Mechele Claude, M.D.

## 2023-05-27 NOTE — Progress Notes (Signed)
Hello [name],  Your lab result is normal and/or stable.Some minor variations that are not significant are commonly marked abnormal, but do not represent any medical problem for you.  Best regards, Warren Stacks, M.D.

## 2023-05-29 ENCOUNTER — Telehealth: Payer: Self-pay | Admitting: Family Medicine

## 2023-05-29 MED ORDER — BLOOD GLUCOSE MONITOR KIT: PACK | 0 refills | Status: AC

## 2023-05-29 NOTE — Telephone Encounter (Signed)
Pt made aware of meds   Amlodipine Metoprolol Benicar  Pt also needs a glucometer sent to [facility] as well.  Script printed and placed on Stacks desk to sign.

## 2023-05-30 ENCOUNTER — Other Ambulatory Visit: Payer: Self-pay | Admitting: *Deleted

## 2023-05-30 DIAGNOSIS — E785 Hyperlipidemia, unspecified: Secondary | ICD-10-CM

## 2023-05-30 DIAGNOSIS — E1165 Type 2 diabetes mellitus with hyperglycemia: Secondary | ICD-10-CM

## 2023-05-30 DIAGNOSIS — I10 Essential (primary) hypertension: Secondary | ICD-10-CM

## 2023-05-30 DIAGNOSIS — I25118 Atherosclerotic heart disease of native coronary artery with other forms of angina pectoris: Secondary | ICD-10-CM

## 2023-05-30 DIAGNOSIS — Z8673 Personal history of transient ischemic attack (TIA), and cerebral infarction without residual deficits: Secondary | ICD-10-CM

## 2023-05-30 MED ORDER — CLOPIDOGREL BISULFATE 75 MG PO TABS
75.0000 mg | ORAL_TABLET | Freq: Every day | ORAL | 2 refills | Status: DC
Start: 2023-05-30 — End: 2023-11-19

## 2023-05-30 MED ORDER — ATORVASTATIN CALCIUM 80 MG PO TABS
80.0000 mg | ORAL_TABLET | Freq: Every day | ORAL | 2 refills | Status: DC
Start: 2023-05-30 — End: 2024-02-17

## 2023-05-30 MED ORDER — AMLODIPINE BESYLATE 10 MG PO TABS
10.0000 mg | ORAL_TABLET | Freq: Every day | ORAL | 3 refills | Status: DC
Start: 2023-05-30 — End: 2024-02-17

## 2023-05-30 MED ORDER — METOPROLOL TARTRATE 50 MG PO TABS
50.0000 mg | ORAL_TABLET | Freq: Every day | ORAL | 2 refills | Status: DC
Start: 2023-05-30 — End: 2024-02-17

## 2023-05-30 MED ORDER — OLMESARTAN MEDOXOMIL 40 MG PO TABS: 40.0000 mg | ORAL_TABLET | Freq: Every day | ORAL | 3 refills | Status: DC

## 2023-05-30 NOTE — Telephone Encounter (Signed)
Fax from [place] pharmacy Recently on 05/26/23 sent to [facility], possibly incorrect pharmacy

## 2023-06-19 ENCOUNTER — Other Ambulatory Visit: Payer: Self-pay | Admitting: *Deleted

## 2023-06-19 DIAGNOSIS — E1165 Type 2 diabetes mellitus with hyperglycemia: Secondary | ICD-10-CM

## 2023-06-19 MED ORDER — TRULICITY 3 MG/0.5ML ~~LOC~~ SOAJ
3.0000 mg | SUBCUTANEOUS | 0 refills | Status: DC
Start: 2023-06-19 — End: 2023-09-05

## 2023-06-23 ENCOUNTER — Other Ambulatory Visit: Payer: Self-pay | Admitting: Family Medicine

## 2023-06-23 ENCOUNTER — Telehealth: Payer: Self-pay | Admitting: Family Medicine

## 2023-06-23 NOTE — Telephone Encounter (Signed)
confirmed that is what she meant

## 2023-06-23 NOTE — Telephone Encounter (Signed)
Is it possible she is referring to Trulicity?

## 2023-06-23 NOTE — Telephone Encounter (Signed)
Can use those. Two given at the same time each week.

## 2023-06-23 NOTE — Telephone Encounter (Signed)
Pt called stating that she normally uses Tresiba 3.5 but pharmacy doesn't have it in stock. Wants to know if she can use the 1.5 instead because pharmacy has that one in stock. Please send new Rx to pharmacy if ok for pt to use.

## 2023-06-24 NOTE — Telephone Encounter (Signed)
Called patient, no answer, mailbox full

## 2023-06-24 NOTE — Telephone Encounter (Signed)
Patient returning call.

## 2023-06-30 NOTE — Telephone Encounter (Signed)
Called patient, no answer 

## 2023-06-30 NOTE — Telephone Encounter (Signed)
Patient return call. ?

## 2023-07-01 NOTE — Telephone Encounter (Signed)
CALLED PATIENT, NO ANSWER, MAILBOX FULL

## 2023-07-28 ENCOUNTER — Ambulatory Visit (INDEPENDENT_AMBULATORY_CARE_PROVIDER_SITE_OTHER): Payer: Medicare HMO

## 2023-07-28 ENCOUNTER — Encounter: Payer: Self-pay | Admitting: Family

## 2023-07-28 VITALS — BP 118/63 | HR 65 | Temp 97.5°F | Ht 64.0 in | Wt 181.0 lb

## 2023-07-28 DIAGNOSIS — L249 Irritant contact dermatitis, unspecified cause: Secondary | ICD-10-CM | POA: Diagnosis not present

## 2023-07-28 MED ORDER — PREDNISONE 10 MG (21) PO TBPK
ORAL_TABLET | ORAL | 0 refills | Status: DC
Start: 2023-07-28 — End: 2023-11-19

## 2023-07-28 MED ORDER — TRIAMCINOLONE ACETONIDE 0.5 % EX OINT
1.0000 | TOPICAL_OINTMENT | Freq: Two times a day (BID) | CUTANEOUS | 0 refills | Status: DC
Start: 2023-07-28 — End: 2023-11-19

## 2023-07-28 NOTE — Patient Instructions (Signed)
Contact Dermatitis Dermatitis is redness, soreness, and swelling (inflammation) of the skin. Contact dermatitis is a reaction to certain substances that touch the skin. There are two types of this condition: Irritant contact dermatitis. This is the most common type. It happens when something irritates your skin, such as when your hands get dry from washing them too often with soap. You can get this type of reaction even if you have not been exposed to the irritant before. Allergic contact dermatitis. This type is caused by a substance that you are allergic to, such as poison ivy. It occurs when you have been exposed to the substance (allergen) and form a sensitivity to it. In some cases, the reaction may start soon after your first exposure to the allergen. In other cases, it may not start until you are exposed to the allergen again. It may then occur every time you are exposed to the allergen in the future. What are the causes? Irritant contact dermatitis is often caused by exposure to: Makeup. Soaps, detergents, and bleaches. Acids. Metal salts, such as nickel. Allergic contact dermatitis is often caused by exposure to: Poisonous plants. Chemicals. Jewelry. Latex. Medicines. Preservatives in products, such as clothes. What increases the risk? You are more likely to get this condition if you have: A job that exposes you to irritants or allergens. Certain medical conditions. These include asthma and eczema. What are the signs or symptoms? Symptoms of this condition may occur in any place on your body that has been touched by the irritant. Symptoms include: Dryness, flaking, or cracking. Redness. Itching. Pain or a burning feeling. Blisters. Drainage of small amounts of blood or clear fluid from skin cracks. With allergic contact dermatitis, there may also be swelling in areas such as the eyelids, mouth, or genitals. How is this diagnosed? This condition is diagnosed with a medical  history and physical exam. A patch skin test may be done to help figure out the cause. If the condition is related to your job, you may need to see an expert in health problems in the workplace (occupational medicine specialist). How is this treated? This condition is treated by staying away from the cause of the reaction and protecting your skin from further contact. Treatment may also include: Steroid creams or ointments. Steroid medicines may need be taken by mouth (orally) in more severe cases. Antibiotics or medicines applied to the skin to kill bacteria (antibacterial ointments). These may be needed if a skin infection is present. Antihistamines. These may be taken orally or put on as a lotion to ease itching. A bandage (dressing). Follow these instructions at home: Skin care Moisturize your skin as needed. Put cool, wet cloths (cool compresses) on the affected areas. Try applying baking soda paste to your skin. Stir water into baking soda until it has the consistency of a paste. Do not scratch your skin. Avoid friction to the affected area. Avoid the use of soaps, perfumes, and dyes. Check the affected areas every day for signs of infection. Check for: More redness, swelling, or pain. More fluid or blood. Warmth. Pus or a bad smell. Medicines Take or apply over-the-counter and prescription medicines only as told by your health care provider. If you were prescribed antibiotics, take or apply them as told by your health care provider. Do not stop using the antibiotic even if you start to feel better. Bathing Try taking a bath with: Epsom salts. Follow the instructions on the packaging. You can get these at your local pharmacy  or grocery store. Baking soda. Pour a small amount into the bath as told by your health care provider. Colloidal oatmeal. Follow the instructions on the packaging. You can get this at your local pharmacy or grocery store. Bathe less often. This may mean bathing  every other day. Bathe in lukewarm water. Avoid using hot water. Bandage care If you were given a dressing, change it as told by your health care provider. Wash your hands with soap and water for at least 20 seconds before and after you change your dressing. If soap and water are not available, use hand sanitizer. General instructions Avoid the substance that caused your reaction. If you do not know what caused it, keep a journal to try to track what caused it. Write down: What you eat and drink. What cosmetics you use. What you wear in the affected area. This includes jewelry. Contact a health care provider if: Your condition does not get better with treatment. Your condition gets worse. You have any signs of infection. You have a fever. You have new symptoms. Your bone or joint under the affected area becomes painful after the skin has healed. Get help right away if: You notice red streaks coming from the affected area. The affected area turns darker. You have trouble breathing. This information is not intended to replace advice given to you by your health care provider. Make sure you discuss any questions you have with your health care provider. Document Revised: 04/26/2022 Document Reviewed: 04/26/2022 Elsevier Patient Education  2024 [employer].

## 2023-07-28 NOTE — Progress Notes (Signed)
Subjective:    Patient ID: Cindy Garrett, female    DOB: 23-Oct-1966, 57 y.o.   MRN: [id number]  Chief Complaint  Patient presents with   Rash    Neck and arms. She notice about a mth ago because it started itching. Patient has changed lotions.   PT presents to the office today with a rash for a few weeks on her neck and arms.  Rash This is a new problem. The current episode started 1 to 4 weeks ago. The problem is unchanged. The affected locations include the neck, left arm and right arm. The rash is characterized by itchiness. Pertinent negatives include no congestion, cough, diarrhea, fatigue, fever, shortness of breath or sore throat. Past treatments include moisturizer. The treatment provided mild relief.      Review of Systems  Constitutional:  Negative for fatigue and fever.  HENT:  Negative for congestion and sore throat.   Respiratory:  Negative for cough and shortness of breath.   Gastrointestinal:  Negative for diarrhea.  Skin:  Positive for rash.  All other systems reviewed and are negative.      Objective:   Physical Exam Vitals reviewed.  Constitutional:      General: She is not in acute distress.    Appearance: She is well-developed.  HENT:     Head: Normocephalic and atraumatic.  Eyes:     Pupils: Pupils are equal, round, and reactive to light.  Neck:     Thyroid: No thyromegaly.  Cardiovascular:     Rate and Rhythm: Normal rate and regular rhythm.     Heart sounds: Normal heart sounds. No murmur heard. Pulmonary:     Effort: Pulmonary effort is normal. No respiratory distress.     Breath sounds: Normal breath sounds. No wheezing.  Abdominal:     General: Bowel sounds are normal. There is no distension.     Palpations: Abdomen is soft.     Tenderness: There is no abdominal tenderness.  Musculoskeletal:        General: No tenderness. Normal range of motion.     Cervical back: Normal range of motion and neck supple.  Skin:    General: Skin is warm  and dry.     Findings: Rash present.          Comments: Dry crack rash on right anterior wrist and left AC and around neck  Neurological:     Mental Status: She is alert and oriented to person, place, and time.     Cranial Nerves: No cranial nerve deficit.     Deep Tendon Reflexes: Reflexes are normal and symmetric.  Psychiatric:        Behavior: Behavior normal.        Thought Content: Thought content normal.        Judgment: Judgment normal.   BP 118/63   Pulse 65   Temp (!) 97.5 F (36.4 C) (Temporal)   Ht 5\' 4"  (1.626 m)   Wt 181 lb (82.1 kg)   LMP 08/03/2013   SpO2 95%   BMI 31.07 kg/m       Assessment & Plan:   Cindy Garrett comes in today with chief complaint of Rash (Neck and arms. She notice about a mth ago because it started itching. Patient has changed lotions.)   Diagnosis and orders addressed:  1. Irritant contact dermatitis, unspecified trigger Avoid scratching  Keep clean and dry Prednisone and kenalog  Warm bath soaks  Follow up if symptoms  worsen or do not improve  - predniSONE (STERAPRED UNI-PAK 21 TAB) 10 MG (21) TBPK tablet; Use as directed  Dispense: 21 tablet; Refill: 0 - triamcinolone ointment (KENALOG) 0.5 %; Apply 1 Application topically 2 (two) times daily.  Dispense: 30 g; Refill: 0    Jannifer Rodney, FNP

## 2023-08-26 ENCOUNTER — Ambulatory Visit: Payer: PRIVATE HEALTH INSURANCE | Admitting: Family Medicine

## 2023-08-26 DIAGNOSIS — R6889 Other general symptoms and signs: Secondary | ICD-10-CM | POA: Diagnosis not present

## 2023-09-05 ENCOUNTER — Other Ambulatory Visit: Payer: Self-pay | Admitting: Family Medicine

## 2023-09-05 DIAGNOSIS — E1165 Type 2 diabetes mellitus with hyperglycemia: Secondary | ICD-10-CM

## 2023-09-16 ENCOUNTER — Ambulatory Visit: Payer: Medicare HMO

## 2023-09-26 DIAGNOSIS — R6889 Other general symptoms and signs: Secondary | ICD-10-CM | POA: Diagnosis not present

## 2023-10-08 ENCOUNTER — Ambulatory Visit: Payer: Medicare HMO | Admitting: Family Medicine

## 2023-10-16 ENCOUNTER — Ambulatory Visit: Payer: Medicare HMO | Admitting: Family Medicine

## 2023-10-16 DIAGNOSIS — R6889 Other general symptoms and signs: Secondary | ICD-10-CM | POA: Diagnosis not present

## 2023-10-21 ENCOUNTER — Encounter: Payer: Self-pay | Admitting: Family Medicine

## 2023-11-19 ENCOUNTER — Ambulatory Visit: Payer: 59 | Admitting: Family Medicine

## 2023-11-19 ENCOUNTER — Encounter: Payer: Self-pay | Admitting: Family Medicine

## 2023-11-19 VITALS — BP 120/62 | HR 59 | Temp 97.4°F | Ht 64.0 in | Wt 184.0 lb

## 2023-11-19 DIAGNOSIS — E785 Hyperlipidemia, unspecified: Secondary | ICD-10-CM | POA: Diagnosis not present

## 2023-11-19 DIAGNOSIS — Z7984 Long term (current) use of oral hypoglycemic drugs: Secondary | ICD-10-CM | POA: Diagnosis not present

## 2023-11-19 DIAGNOSIS — L309 Dermatitis, unspecified: Secondary | ICD-10-CM | POA: Diagnosis not present

## 2023-11-19 DIAGNOSIS — I10 Essential (primary) hypertension: Secondary | ICD-10-CM | POA: Diagnosis not present

## 2023-11-19 DIAGNOSIS — E1165 Type 2 diabetes mellitus with hyperglycemia: Secondary | ICD-10-CM

## 2023-11-19 LAB — LIPID PANEL

## 2023-11-19 LAB — BAYER DCA HB A1C WAIVED: HB A1C (BAYER DCA - WAIVED): 6 % — ABNORMAL HIGH (ref 4.8–5.6)

## 2023-11-19 MED ORDER — TRULICITY 3 MG/0.5ML ~~LOC~~ SOAJ
3.0000 mg | SUBCUTANEOUS | 3 refills | Status: DC
Start: 2023-11-19 — End: 2024-02-17

## 2023-11-19 MED ORDER — ASPIRIN 81 MG PO TBEC: 81.0000 mg | DELAYED_RELEASE_TABLET | Freq: Every day | ORAL | Status: AC

## 2023-11-19 MED ORDER — BETAMETHASONE DIPROPIONATE AUG 0.05 % EX CREA: TOPICAL_CREAM | Freq: Two times a day (BID) | CUTANEOUS | 1 refills | Status: AC

## 2023-11-19 NOTE — Progress Notes (Signed)
 Subjective:  Patient ID: Cindy Garrett, female    DOB: 11-May-1966  Age: 58 y.o. MRN: [id number]  CC: Medical Management of Chronic Issues and Stress (Holidays  were stressful. Thoughts of self harm but no action. Mellowing out now. )   HPI ANISTY RIAS presents for presents forFollow-up of diabetes. Patient checks blood sugar at home.   100-150 fasting and  postprandial Patient denies symptoms such as polyuria, polydipsia, excessive hunger, nausea No significant hypoglycemic spells noted. Medications reviewed. Pt reports taking them regularly without complication/adverse reaction being reported today.  Lab Results  Component Value Date   HGBA1C 6.0 (H) 05/26/2023   HGBA1C 6.0 (H) 02/24/2023   HGBA1C 5.8 (H) 11/25/2022   Problems with Family, quit smoking over the holidays. Felt stressed out. Now back to normal. Denies xs stress.        11/19/2023   10:39 AM 11/19/2023   10:35 AM 07/28/2023    8:51 AM  Depression screen PHQ 2/9  Decreased Interest 0 1 0  Down, Depressed, Hopeless 0 1 0  PHQ - 2 Score 0 2 0  Altered sleeping  1 1  Tired, decreased energy  3 0  Change in appetite  1 1  Feeling bad or failure about yourself   0 0  Trouble concentrating  3 0  Moving slowly or fidgety/restless  0 0  Suicidal thoughts  0 0  PHQ-9 Score  10 2  Difficult doing work/chores   Not difficult at all    History Aishat has a past medical history of Depression, Diabetes mellitus without complication (HCC), Hyperlipidemia, Hypertension, and Stroke (HCC).   She has a past surgical history that includes Tubal ligation; Colonoscopy (N/A, 05/26/2015); LEFT HEART CATH AND CORONARY ANGIOGRAPHY (N/A, 12/10/2016); Coronary artery bypass graft (N/A, 12/10/2016); and TEE without cardioversion (N/A, 12/10/2016).   Her family history includes Diabetes in her father; Heart disease in her mother; Hyperlipidemia in her sister; Hypertension in her sister; Mental illness in her brother.She reports that she  quit smoking about 6 years ago. Her smoking use included cigarettes. She started smoking about 36 years ago. She has a 30 pack-year smoking history. She has never used smokeless tobacco. She reports that she does not currently use alcohol. She reports that she does not use drugs.    ROS Review of Systems  Constitutional: Negative.   HENT: Negative.    Eyes:  Negative for visual disturbance.  Respiratory:  Negative for shortness of breath.   Cardiovascular:  Negative for chest pain.  Gastrointestinal:  Negative for abdominal pain.  Musculoskeletal:  Negative for arthralgias.  Skin:  Positive for rash (left anterior neck, left forearm. papular, dry).    Objective:  BP 120/62   Pulse (!) 59   Temp (!) 97.4 F (36.3 C) (Temporal)   Ht 5\' 4"  (1.626 m)   Wt 184 lb (83.5 kg)   LMP 08/03/2013   SpO2 100%   BMI 31.58 kg/m   BP Readings from Last 3 Encounters:  11/19/23 120/62  07/28/23 118/63  05/26/23 (!) 170/72    Wt Readings from Last 3 Encounters:  11/19/23 184 lb (83.5 kg)  07/28/23 181 lb (82.1 kg)  05/26/23 181 lb 6.4 oz (82.3 kg)     Physical Exam Constitutional:      General: She is not in acute distress.    Appearance: She is well-developed.  Cardiovascular:     Rate and Rhythm: Normal rate and regular rhythm.  Pulmonary:  Breath sounds: Normal breath sounds.  Musculoskeletal:        General: Normal range of motion.  Skin:    General: Skin is warm and dry.     Findings: Rash (papular, dry at proximal foream, 4-5 cm and 3X5 cm at left anterior neck) present.  Neurological:     Mental Status: She is alert and oriented to person, place, and time.       Assessment & Plan:   Guillermina was seen today for medical management of chronic issues and stress.  Diagnoses and all orders for this visit:  Dyslipidemia -     CMP14+EGFR -     Lipid panel  Essential (primary) hypertension -     CBC with Differential/Platelet -     CMP14+EGFR  Type 2 diabetes  mellitus with hyperglycemia, without long-term current use of insulin  (HCC) -     Bayer DCA Hb A1c Waived -     CBC with Differential/Platelet -     CMP14+EGFR -     Dulaglutide  (TRULICITY ) 3 MG/0.5ML SOAJ; Inject 3 mg into the skin once a week. INJECT 3MG  (1 PEN) UNDER THE SKIN EVERY WEEK  Eczema, unspecified type -     augmented betamethasone  dipropionate (DIPROLENE -AF) 0.05 % cream; Apply topically 2 (two) times daily. At affected areas (avoid face and genitals)  Other orders -     aspirin  EC 81 MG tablet; Take 1 tablet (81 mg total) by mouth daily. Swallow whole.       I have discontinued Megyn Grimes. Funke's clopidogrel , predniSONE , and triamcinolone  ointment. I have also changed her Trulicity . Additionally, I am having her start on aspirin  EC and augmented betamethasone  dipropionate. Lastly, I am having her maintain her loratadine , fluticasone , metFORMIN , Lancet Device, blood glucose meter kit and supplies, amLODipine , atorvastatin , metoprolol  tartrate, olmesartan , TRUEplus Lancets 33G, and True Metrix Blood Glucose Test.  Allergies as of 11/19/2023       Reactions   Lisinopril  Cough   Penicillins Itching, Rash   Has patient had a PCN reaction causing immediate rash, facial/tongue/throat swelling, SOB or lightheadedness with hypotension: Yes Has patient had a PCN reaction causing severe rash involving mucus membranes or skin necrosis: Yes Has patient had a PCN reaction that required hospitalization Yes Has patient had a PCN reaction occurring within the last 10 years: No If all of the above answers are "NO", then may proceed with Cephalosporin use.        Medication List        Accurate as of November 19, 2023 11:31 AM. If you have any questions, ask your nurse or doctor.          STOP taking these medications    clopidogrel  75 MG tablet Commonly known as: PLAVIX  Stopped by: Adamarys Shall   predniSONE  10 MG (21) Tbpk tablet Commonly known as: STERAPRED UNI-PAK 21  TAB Stopped by: Chele Cornell   triamcinolone  ointment 0.5 % Commonly known as: KENALOG  Stopped by: Genette Huertas       TAKE these medications    amLODipine  10 MG tablet Commonly known as: NORVASC  Take 1 tablet (10 mg total) by mouth daily.   aspirin  EC 81 MG tablet Take 1 tablet (81 mg total) by mouth daily. Swallow whole. Started by: Sharna Gabrys   atorvastatin  80 MG tablet Commonly known as: LIPITOR  Take 1 tablet (80 mg total) by mouth daily.   augmented betamethasone  dipropionate 0.05 % cream Commonly known as: DIPROLENE -AF Apply topically 2 (two) times daily. At affected  areas (avoid face and genitals) Started by: Jannet Calip   blood glucose meter kit and supplies Kit Dispense based on patient and insurance preference. Use up to four times daily as directed. (FOR ICD-10 : E11.9   fluticasone  50 MCG/ACT nasal spray Commonly known as: FLONASE  Place 2 sprays into both nostrils daily.   Lancet Device Misc 2 Units by Does not apply route in the morning and at bedtime.   loratadine  10 MG tablet Commonly known as: CLARITIN  Take 1 tablet (10 mg total) by mouth daily.   metFORMIN  500 MG 24 hr tablet Commonly known as: GLUCOPHAGE -XR Take 1 tablet (500 mg total) by mouth 2 (two) times daily with a meal.   metoprolol  tartrate 50 MG tablet Commonly known as: LOPRESSOR  Take 1 tablet (50 mg total) by mouth daily.   olmesartan  40 MG tablet Commonly known as: Benicar  Take 1 tablet (40 mg total) by mouth daily. For blood pressure   True Metrix Blood Glucose Test test strip Generic drug: glucose blood Test BS up to four times daily Dx E11.9   TRUEplus Lancets 33G Misc Test BS up to four times daily Dx E11.9   Trulicity  3 MG/0.5ML Soaj Generic drug: Dulaglutide  Inject 3 mg into the skin once a week. INJECT 3MG  (1 PEN) UNDER THE SKIN EVERY WEEK What changed: See the new instructions. Changed by: Vallorie Gayer Garrett Mitchum         Follow-up: Return in about 3 months  (around 02/17/2024).  Roise Cleaver, M.D.

## 2023-11-20 ENCOUNTER — Ambulatory Visit: Payer: 59

## 2023-11-20 VITALS — Ht 64.0 in | Wt 184.0 lb

## 2023-11-20 DIAGNOSIS — Z0000 Encounter for general adult medical examination without abnormal findings: Secondary | ICD-10-CM

## 2023-11-20 LAB — CMP14+EGFR
ALT: 14 [IU]/L (ref 0–32)
AST: 18 IU/L (ref 0–40)
Albumin: 3.9 g/dL (ref 3.8–4.9)
Alkaline Phosphatase: 127 IU/L — ABNORMAL HIGH (ref 44–121)
BUN/Creatinine Ratio: 21 (ref 9–23)
BUN: 23 mg/dL (ref 6–24)
Bilirubin Total: 0.3 mg/dL (ref 0.0–1.2)
CO2: 21 mmol/L (ref 20–29)
Calcium: 8.3 mg/dL — ABNORMAL LOW (ref 8.7–10.2)
Chloride: 108 mmol/L — ABNORMAL HIGH (ref 96–106)
Creatinine, Ser: 1.08 mg/dL — ABNORMAL HIGH (ref 0.57–1.00)
Globulin, Total: 2.4 g/dL (ref 1.5–4.5)
Glucose: 135 mg/dL — ABNORMAL HIGH (ref 70–99)
Potassium: 4.6 mmol/L (ref 3.5–5.2)
Sodium: 143 mmol/L (ref 134–144)
Total Protein: 6.3 g/dL (ref 6.0–8.5)
eGFR: 60 mL/min/{1.73_m2} (ref >59)

## 2023-11-20 LAB — CBC WITH DIFFERENTIAL/PLATELET
Basophils Absolute: 0 10*3/uL (ref 0.0–0.2)
Basos: 1 %
EOS (ABSOLUTE): 0.3 10*3/uL (ref 0.0–0.4)
Eos: 5 %
Hematocrit: 33.9 % — ABNORMAL LOW (ref 34.0–46.6)
Hemoglobin: 11.6 g/dL (ref 11.1–15.9)
Immature Grans (Abs): 0 10*3/uL (ref 0.0–0.1)
Immature Granulocytes: 0 %
Lymphocytes Absolute: 1.5 10*3/uL (ref 0.7–3.1)
Lymphs: 29 %
MCH: 29.7 pg (ref 26.6–33.0)
MCHC: 34.2 g/dL (ref 31.5–35.7)
MCV: 87 fL (ref 79–97)
Monocytes Absolute: 0.4 10*3/uL (ref 0.1–0.9)
Monocytes: 8 %
Neutrophils Absolute: 3 10*3/uL (ref 1.4–7.0)
Neutrophils: 57 %
Platelets: 335 10*3/uL (ref 150–450)
RBC: 3.9 x10E6/uL (ref 3.77–5.28)
RDW: 12.8 % (ref 11.7–15.4)
WBC: 5.1 10*3/uL (ref 3.4–10.8)

## 2023-11-20 LAB — LIPID PANEL
Chol/HDL Ratio: 3.4 ratio (ref 0.0–4.4)
Cholesterol, Total: 110 mg/dL (ref 100–199)
HDL: 32 mg/dL — ABNORMAL LOW (ref >39)
LDL Chol Calc (NIH): 51 mg/dL (ref 0–99)
Triglycerides: 160 mg/dL — ABNORMAL HIGH (ref 0–149)
VLDL Cholesterol Cal: 27 mg/dL (ref 5–40)

## 2023-11-20 NOTE — Patient Instructions (Addendum)
[name] , Thank you for taking time to come for your Medicare Wellness Visit. I appreciate your ongoing commitment to your health goals. Please review the following plan we discussed and let me know if I can assist you in the future.   Referrals/Orders/Follow-Ups/Clinician Recommendations: Aim for 30 minutes of exercise or brisk walking, 6-8 glasses of water, and 5 servings of fruits and vegetables each day.  This is a list of the screening recommended for you and due dates:  Health Maintenance  Topic Date Due   Screening for Lung Cancer  Never done   COVID-19 Vaccine (2 - 2024-25 season) 12/05/2023*   Flu Shot  02/02/2024*   Hepatitis C Screening  05/25/2024*   DTaP/Tdap/Td vaccine (3 - Td or Tdap) 11/18/2024*   Pneumococcal Vaccination (1 of 2 - PCV) 11/24/2024*   Mammogram  01/06/2024   Eye exam for diabetics  02/06/2024   Yearly kidney health urinalysis for diabetes  02/24/2024   Complete foot exam   02/24/2024   Hemoglobin A1C  05/18/2024   Yearly kidney function blood test for diabetes  11/18/2024   Medicare Annual Wellness Visit  11/19/2024   Colon Cancer Screening  05/25/2025   Pap with HPV screening  07/26/2026   HIV Screening  Completed   HPV Vaccine  Aged Out   Zoster (Shingles) Vaccine  Discontinued  *Topic was postponed. The date shown is not the original due date.    Advanced directives: (ACP Link)Information on Advanced Care Planning can be found at [facility] of [place] Advance Health Care Directives Advance Health Care Directives ([email or web address]   Next Medicare Annual Wellness Visit scheduled for next year: Yes

## 2023-11-20 NOTE — Progress Notes (Signed)
Subjective:   Cindy Garrett is a 58 y.o. female who presents for Medicare Annual (Subsequent) preventive examination.  Visit Complete: Virtual I connected with  Duke Salvia on 11/20/23 by a audio enabled telemedicine application and verified that I am speaking with the correct person using two identifiers.  Patient Location: Home  Provider Location: Home Office  This patient declined Interactive audio and video telecommunications. Therefore the visit was completed with audio only.  I discussed the limitations of evaluation and management by telemedicine. The patient expressed understanding and agreed to proceed.  Vital Signs: Because this visit was a virtual/telehealth visit, some criteria may be missing or patient reported. Any vitals not documented were not able to be obtained and vitals that have been documented are patient reported.  Cardiac Risk Factors include: advanced age (>50men, >70 women);diabetes mellitus;hypertension     Objective:    Today's Vitals   11/20/23 1008  Weight: 184 lb (83.5 kg)  Height: 5\' 4"  (1.626 m)   Body mass index is 31.58 kg/m.     11/20/2023   10:19 AM 11/13/2022   10:39 AM 02/05/2021   12:07 PM 04/14/2017    2:18 PM 07/14/2016    4:24 PM 05/02/2015   10:11 PM  Advanced Directives  Does Patient Have a Medical Advance Directive? No No No No No No  Would patient like information on creating a medical advance directive? Yes (MAU/Ambulatory/Procedural Areas - Information given) No - Patient declined No - Patient declined No - Patient declined No - patient declined information No - patient declined information    Current Medications (verified) Outpatient Encounter Medications as of 11/20/2023  Medication Sig   amLODipine (NORVASC) 10 MG tablet Take 1 tablet (10 mg total) by mouth daily.   aspirin EC 81 MG tablet Take 1 tablet (81 mg total) by mouth daily. Swallow whole.   atorvastatin (LIPITOR) 80 MG tablet Take 1 tablet (80 mg total) by  mouth daily.   augmented betamethasone dipropionate (DIPROLENE-AF) 0.05 % cream Apply topically 2 (two) times daily. At affected areas (avoid face and genitals)   blood glucose meter kit and supplies KIT Dispense based on patient and insurance preference. Use up to four times daily as directed. (FOR ICD-10 : E11.9   Dulaglutide (TRULICITY) 3 MG/0.5ML SOAJ Inject 3 mg into the skin once a week. INJECT 3MG  (1 PEN) UNDER THE SKIN EVERY WEEK   fluticasone (FLONASE) 50 MCG/ACT nasal spray Place 2 sprays into both nostrils daily.   glucose blood (TRUE METRIX BLOOD GLUCOSE TEST) test strip Test BS up to four times daily Dx E11.9   Lancet Device MISC 2 Units by Does not apply route in the morning and at bedtime.   metFORMIN (GLUCOPHAGE-XR) 500 MG 24 hr tablet Take 1 tablet (500 mg total) by mouth 2 (two) times daily with a meal.   metoprolol tartrate (LOPRESSOR) 50 MG tablet Take 1 tablet (50 mg total) by mouth daily.   olmesartan (BENICAR) 40 MG tablet Take 1 tablet (40 mg total) by mouth daily. For blood pressure   TRUEplus Lancets 33G MISC Test BS up to four times daily Dx E11.9   loratadine (CLARITIN) 10 MG tablet Take 1 tablet (10 mg total) by mouth daily.   No facility-administered encounter medications on file as of 11/20/2023.    Allergies (verified) Lisinopril and Penicillins   History: Past Medical History:  Diagnosis Date   Depression    Diabetes mellitus without complication (HCC)    Hyperlipidemia  Hypertension    Stroke [facility])    Past Surgical History:  Procedure Laterality Date   COLONOSCOPY N/A 05/26/2015   Procedure: COLONOSCOPY;  Surgeon: West Bali, MD;  Location: [facility] ENDO SUITE;  Service: Endoscopy;  Laterality: N/A;  2:00   CORONARY ARTERY BYPASS GRAFT N/A 12/10/2016   Procedure: CORONARY ARTERY BYPASS GRAFTING (CABG) times three using the left internal mammary artery and the right greater saphenous vein;  Surgeon: Alleen Borne, MD;  Location: [facility] OR;  Service: Open  Heart Surgery;  Laterality: N/A;   LEFT HEART CATH AND CORONARY ANGIOGRAPHY N/A 12/10/2016   Procedure: Left Heart Cath and Coronary Angiography;  Surgeon: Lennette Bihari, MD;  Location: [facility] INVASIVE CV LAB;  Service: Cardiovascular;  Laterality: N/A;   TEE WITHOUT CARDIOVERSION N/A 12/10/2016   Procedure: TRANSESOPHAGEAL ECHOCARDIOGRAM (TEE);  Surgeon: Alleen Borne, MD;  Location: [facility] OR;  Service: Open Heart Surgery;  Laterality: N/A;   TUBAL LIGATION     Family History  Problem Relation Age of Onset   Heart disease Mother    Diabetes Father    Hyperlipidemia Sister    Hypertension Sister    Mental illness Brother    Breast cancer Neg Hx    Social History   Socioeconomic History   Marital status: Legally Separated    Spouse name: Not on file   Number of children: Not on file   Years of education: Not on file   Highest education level: 9th grade  Occupational History   Not on file  Tobacco Use   Smoking status: Former    Current packs/day: 0.00    Average packs/day: 1 pack/day for 30.0 years (30.0 ttl pk-yrs)    Types: Cigarettes    Start date: 12/10/1986    Quit date: 12/10/2016    Years since quitting: 6.9   Smokeless tobacco: Never  Vaping Use   Vaping status: Never Used  Substance and Sexual Activity   Alcohol use: Not Currently    Comment: rarely   Drug use: No   Sexual activity: Not Currently    Birth control/protection: Surgical  Other Topics Concern   Not on file  Social History Narrative   Not on file   Social Drivers of Health   Financial Resource Strain: Medium Risk (11/20/2023)   Overall Financial Resource Strain (CARDIA)    Difficulty of Paying Living Expenses: Somewhat hard  Food Insecurity: Food Insecurity Present (11/20/2023)   Hunger Vital Sign    Worried About Running Out of Food in the Last Year: Often true    Ran Out of Food in the Last Year: Sometimes true  Transportation Needs: No Transportation Needs (11/20/2023)   PRAPARE - Therapist, art (Medical): No    Lack of Transportation (Non-Medical): No  Physical Activity: Insufficiently Active (11/20/2023)   Exercise Vital Sign    Days of Exercise per Week: 1 day    Minutes of Exercise per Session: 30 min  Stress: No Stress Concern Present (11/20/2023)   [employer] of Occupational Health - Occupational Stress Questionnaire    Feeling of Stress : Not at all  Recent Concern: Stress - Stress Concern Present (08/22/2023)   [employer] of Occupational Health - Occupational Stress Questionnaire    Feeling of Stress : To some extent  Social Connections: Moderately Isolated (11/20/2023)   Social Connection and Isolation Panel [NHANES]    Frequency of Communication with Friends and Family: More than three times a week  Frequency of Social Gatherings with Friends and Family: Twice a week    Attends Religious Services: 1 to 4 times per year    Active Member of [employer] or Organizations: No    Attends Engineer, structural: Never    Marital Status: Separated    Tobacco Counseling Counseling given: Not Answered   Clinical Intake:  Pre-visit preparation completed: Yes  Pain : No/denies pain     Diabetes: Yes CBG done?: No Did pt. bring in CBG monitor from home?: No  How often do you need to have someone help you when you read instructions, pamphlets, or other written materials from your doctor or pharmacy?: 1 - Never  Interpreter Needed?: No  Information entered by :: Kandis Fantasia LPN   Activities of Daily Living    11/20/2023   10:18 AM 11/15/2023    8:40 AM  In your present state of health, do you have any difficulty performing the following activities:  Hearing? 0 0  Vision? 0 0  Difficulty concentrating or making decisions? 0 0  Walking or climbing stairs? 0 0  Dressing or bathing? 0 0  Doing errands, shopping? 0 0  Preparing Food and eating ? N N  Using the Toilet? N N  In the past six months, have you accidently  leaked urine? N N  Do you have problems with loss of bowel control? N N  Managing your Medications? N N  Managing your Finances? N N  Housekeeping or managing your Housekeeping? N N    Patient Care Team: Mechele Claude, MD as PCP - General (Family Medicine) Wyline Mood Dorothe Pea, MD as PCP - Cardiology (Cardiology) Michaelle Copas, MD as Referring Physician (Optometry)  Indicate any recent Medical Services you may have received from other than [facility] providers in the past year (date may be approximate).     Assessment:   This is a routine wellness examination for Amarria.  Hearing/Vision screen Hearing Screening - Comments:: Denies hearing difficulties   Vision Screening - Comments:: Wears rx glasses - up to date with routine eye exams with Dr. Conley Rolls     Goals Addressed   None   Depression Screen    11/20/2023   10:13 AM 11/19/2023   10:39 AM 11/19/2023   10:35 AM 07/28/2023    8:51 AM 05/26/2023    7:53 AM 02/24/2023    7:48 AM 11/25/2022    3:48 PM  PHQ 2/9 Scores  PHQ - 2 Score 0 0 2 0 0 0 0  PHQ- 9 Score 8  10 2        Fall Risk    11/20/2023   10:18 AM 11/19/2023   10:39 AM 11/15/2023    8:40 AM 05/26/2023    7:53 AM 02/24/2023    7:48 AM  Fall Risk   Falls in the past year? 0 0 0 0 0  Number falls in past yr: 0  0    Injury with Fall? 0  0    Risk for fall due to : No Fall Risks      Follow up Falls prevention discussed;Education provided;Falls evaluation completed        MEDICARE RISK AT HOME: Medicare Risk at Home Any stairs in or around the home?: No If so, are there any without handrails?: No Home free of loose throw rugs in walkways, pet beds, electrical cords, etc?: Yes Adequate lighting in your home to reduce risk of falls?: Yes Life alert?: No Use of a cane,  walker or w/c?: No Grab bars in the bathroom?: Yes Shower chair or bench in shower?: No Elevated toilet seat or a handicapped toilet?: Yes  TIMED UP AND GO:  Was the test performed?  No     Cognitive Function:        11/20/2023   10:19 AM 11/13/2022   10:40 AM  6CIT Screen  What Year? 0 points 0 points  What month? 0 points 0 points  What time? 0 points 0 points  Count back from 20 0 points 0 points  Months in reverse 0 points 0 points  Repeat phrase 0 points 0 points  Total Score 0 points 0 points    Immunizations Immunization History  Administered Date(s) Administered   Moderna Sars-Covid-2 Vaccination 08/18/2020   Td 07/15/1995   Tdap 11/05/1999    TDAP status: Due, Education has been provided regarding the importance of this vaccine. Advised may receive this vaccine at local pharmacy or Health Dept. Aware to provide a copy of the vaccination record if obtained from local pharmacy or Health Dept. Verbalized acceptance and understanding.  Flu Vaccine status: Declined, Education has been provided regarding the importance of this vaccine but patient still declined. Advised may receive this vaccine at local pharmacy or Health Dept. Aware to provide a copy of the vaccination record if obtained from local pharmacy or Health Dept. Verbalized acceptance and understanding.  Pneumococcal vaccine status: Declined,  Education has been provided regarding the importance of this vaccine but patient still declined. Advised may receive this vaccine at local pharmacy or Health Dept. Aware to provide a copy of the vaccination record if obtained from local pharmacy or Health Dept. Verbalized acceptance and understanding.   Covid-19 vaccine status: Information provided on how to obtain vaccines.   Qualifies for Shingles Vaccine? No    Screening Tests Health Maintenance  Topic Date Due   Lung Cancer Screening  Never done   COVID-19 Vaccine (2 - 2024-25 season) 12/05/2023 (Originally 07/06/2023)   INFLUENZA VACCINE  02/02/2024 (Originally 06/05/2023)   Hepatitis C Screening  05/25/2024 (Originally 02/09/1983)   DTaP/Tdap/Td (3 - Td or Tdap) 11/18/2024 (Originally 11/04/2009)    Pneumococcal Vaccine 38-52 Years old (1 of 2 - PCV) 11/24/2024 (Originally 02/09/1971)   MAMMOGRAM  01/06/2024   OPHTHALMOLOGY EXAM  02/06/2024   Diabetic kidney evaluation - Urine ACR  02/24/2024   FOOT EXAM  02/24/2024   HEMOGLOBIN A1C  05/18/2024   Diabetic kidney evaluation - eGFR measurement  11/18/2024   Medicare Annual Wellness (AWV)  11/19/2024   Colonoscopy  05/25/2025   Cervical Cancer Screening (HPV/Pap Cotest)  07/26/2026   HIV Screening  Completed   HPV VACCINES  Aged Out   Zoster Vaccines- Shingrix  Discontinued    Health Maintenance  Health Maintenance Due  Topic Date Due   Lung Cancer Screening  Never done    Colorectal cancer screening: Type of screening: Colonoscopy. Completed 05/26/15. Repeat every 10 years  Mammogram status: Completed 01/06/23. Repeat every year  Lung Cancer Screening: (Low Dose CT Chest recommended if Age 2-80 years, 20 pack-year currently smoking OR have quit w/in 15years.) does qualify.   Lung Cancer Screening Referral: Patient declines at this time   Additional Screening:  Hepatitis C Screening: does qualify  Vision Screening: Recommended annual ophthalmology exams for early detection of glaucoma and other disorders of the eye. Is the patient up to date with their annual eye exam?  Yes  Who is the provider or what is the name of  the office in which the patient attends annual eye exams? Dr. Conley Rolls  If pt is not established with a provider, would they like to be referred to a provider to establish care? No .   Dental Screening: Recommended annual dental exams for proper oral hygiene  Diabetic Foot Exam: Diabetic Foot Exam: Completed 02/24/23  Community Resource Referral / Chronic Care Management: CRR required this visit?  No   CCM required this visit?  No     Plan:     I have personally reviewed and noted the following in the patient's chart:   Medical and social history Use of alcohol, tobacco or illicit drugs  Current  medications and supplements including opioid prescriptions. Patient is not currently taking opioid prescriptions. Functional ability and status Nutritional status Physical activity Advanced directives List of other physicians Hospitalizations, surgeries, and ER visits in previous 12 months Vitals Screenings to include cognitive, depression, and falls Referrals and appointments  In addition, I have reviewed and discussed with patient certain preventive protocols, quality metrics, and best practice recommendations. A written personalized care plan for preventive services as well as general preventive health recommendations were provided to patient.     Kandis Fantasia [place], [place]   1/61/0960   After Visit Summary: (MyChart) Due to this being a telephonic visit, the after visit summary with patients personalized plan was offered to patient via MyChart   Nurse Notes: No concerns at this time

## 2023-11-21 ENCOUNTER — Encounter: Payer: Self-pay | Admitting: Family Medicine

## 2023-11-21 NOTE — Progress Notes (Signed)
Hello [name],  Your lab result is normal and/or stable.Some minor variations that are not significant are commonly marked abnormal, but do not represent any medical problem for you.  Best regards, Malyssa Maris, M.D.

## 2024-02-17 ENCOUNTER — Ambulatory Visit (INDEPENDENT_AMBULATORY_CARE_PROVIDER_SITE_OTHER): Payer: 59 | Admitting: Family Medicine

## 2024-02-17 ENCOUNTER — Encounter: Payer: Self-pay | Admitting: Family Medicine

## 2024-02-17 VITALS — BP 133/72 | HR 67 | Temp 98.0°F | Ht 64.0 in | Wt 179.0 lb

## 2024-02-17 DIAGNOSIS — E1165 Type 2 diabetes mellitus with hyperglycemia: Secondary | ICD-10-CM | POA: Diagnosis not present

## 2024-02-17 DIAGNOSIS — E785 Hyperlipidemia, unspecified: Secondary | ICD-10-CM

## 2024-02-17 DIAGNOSIS — I25118 Atherosclerotic heart disease of native coronary artery with other forms of angina pectoris: Secondary | ICD-10-CM | POA: Diagnosis not present

## 2024-02-17 DIAGNOSIS — I10 Essential (primary) hypertension: Secondary | ICD-10-CM

## 2024-02-17 DIAGNOSIS — Z7984 Long term (current) use of oral hypoglycemic drugs: Secondary | ICD-10-CM

## 2024-02-17 DIAGNOSIS — Z7985 Long-term (current) use of injectable non-insulin antidiabetic drugs: Secondary | ICD-10-CM | POA: Diagnosis not present

## 2024-02-17 LAB — BAYER DCA HB A1C WAIVED: HB A1C (BAYER DCA - WAIVED): 5.7 % — ABNORMAL HIGH (ref 4.8–5.6)

## 2024-02-17 MED ORDER — METOPROLOL TARTRATE 50 MG PO TABS: 50.0000 mg | ORAL_TABLET | Freq: Every day | ORAL | 2 refills | Status: DC

## 2024-02-17 MED ORDER — PREGABALIN 50 MG PO CAPS: ORAL_CAPSULE | ORAL | 0 refills | Status: DC

## 2024-02-17 MED ORDER — OLMESARTAN MEDOXOMIL 40 MG PO TABS: 40.0000 mg | ORAL_TABLET | Freq: Every day | ORAL | 3 refills | Status: DC

## 2024-02-17 MED ORDER — ATORVASTATIN CALCIUM 80 MG PO TABS: 80.0000 mg | ORAL_TABLET | Freq: Every day | ORAL | 2 refills | Status: DC

## 2024-02-17 MED ORDER — METFORMIN HCL ER 500 MG PO TB24: 500.0000 mg | ORAL_TABLET | Freq: Two times a day (BID) | ORAL | 2 refills | Status: DC

## 2024-02-17 MED ORDER — AMLODIPINE BESYLATE 10 MG PO TABS: 10.0000 mg | ORAL_TABLET | Freq: Every day | ORAL | 3 refills | Status: DC

## 2024-02-17 MED ORDER — TRULICITY 3 MG/0.5ML ~~LOC~~ SOAJ: 3.0000 mg | SUBCUTANEOUS | 3 refills | Status: DC

## 2024-02-17 NOTE — Progress Notes (Signed)
 Subjective:  Patient ID: Cindy Garrett, female    DOB: 1966-05-11  Age: 58 y.o. MRN: [id number]  CC: Diabetes (A1c obtained) and Sciatica (Hip pain causing numbness in right leg that makes her feel like she might fall. Heat seems to help. )   HPI [name] presents for right lower ext pain ofr 2 months. Radiating from lower back . Strong pain 15-20/10 presents forFollow-up of diabetes. Patient checks blood sugar at home.   180 fasting and 172 postprandial Patient denies symptoms such as polyuria, polydipsia, excessive hunger, nausea No significant hypoglycemic spells noted. Medications reviewed. Pt reports taking them regularly without complication/adverse reaction being reported today.  Lab Results  Component Value Date   HGBA1C 5.7 (H) 02/17/2024   HGBA1C 6.0 (H) 11/19/2023   HGBA1C 6.0 (H) 05/26/2023         02/17/2024   11:54 AM 11/20/2023   10:13 AM 11/19/2023   10:39 AM  Depression screen PHQ 2/9  Decreased Interest 0 0 0  Down, Depressed, Hopeless 0 0 0  PHQ - 2 Score 0 0 0  Altered sleeping 0 1   Tired, decreased energy 1 3   Change in appetite 0 1   Feeling bad or failure about yourself  0 0   Trouble concentrating 0 3   Moving slowly or fidgety/restless 1 0   Suicidal thoughts 0 0   PHQ-9 Score 2 8   Difficult doing work/chores Not difficult at all      History Cindy Garrett has a past medical history of Depression, Diabetes mellitus without complication (HCC), Hyperlipidemia, Hypertension, and Stroke (HCC).   She has a past surgical history that includes Tubal ligation; Colonoscopy (N/A, 05/26/2015); LEFT HEART CATH AND CORONARY ANGIOGRAPHY (N/A, 12/10/2016); Coronary artery bypass graft (N/A, 12/10/2016); and TEE without cardioversion (N/A, 12/10/2016).   Her family history includes Diabetes in her father; Heart disease in her mother; Hyperlipidemia in her sister; Hypertension in her sister; Mental illness in her brother.She reports that she quit smoking about 7  years ago. Her smoking use included cigarettes. She started smoking about 37 years ago. She has a 30 pack-year smoking history. She has never used smokeless tobacco. She reports that she does not currently use alcohol. She reports that she does not use drugs.    ROS Review of Systems  Constitutional: Negative.   HENT: Negative.    Eyes:  Negative for visual disturbance.  Respiratory:  Negative for shortness of breath.   Cardiovascular:  Negative for chest pain.  Gastrointestinal:  Negative for abdominal pain.  Musculoskeletal:  Negative for arthralgias.    Objective:  BP 133/72   Pulse 67   Temp 98 F (36.7 C)   Ht 5\' 4"  (1.626 m)   Wt 179 lb (81.2 kg)   LMP 08/03/2013   SpO2 96%   BMI 30.73 kg/m   BP Readings from Last 3 Encounters:  02/17/24 133/72  11/19/23 120/62  07/28/23 118/63    Wt Readings from Last 3 Encounters:  02/17/24 179 lb (81.2 kg)  11/20/23 184 lb (83.5 kg)  11/19/23 184 lb (83.5 kg)     Physical Exam Constitutional:      General: She is not in acute distress.    Appearance: She is well-developed.  Cardiovascular:     Rate and Rhythm: Normal rate and regular rhythm.  Pulmonary:     Breath sounds: Normal breath sounds.  Musculoskeletal:        General: Normal range of motion.  Skin:    General: Skin is warm and dry.  Neurological:     Mental Status: She is alert and oriented to person, place, and time.      Assessment & Plan:  Dyslipidemia -     Lipid panel -     Atorvastatin Calcium; Take 1 tablet (80 mg total) by mouth daily.  Dispense: 100 tablet; Refill: 2  Essential (primary) hypertension -     CBC with Differential/Platelet -     CMP14+EGFR -     amLODIPine Besylate; Take 1 tablet (10 mg total) by mouth daily.  Dispense: 90 tablet; Refill: 3  Type 2 diabetes mellitus with hyperglycemia, without long-term current use of insulin (HCC) -     Bayer DCA Hb A1c Waived -     Atorvastatin Calcium; Take 1 tablet (80 mg total) by  mouth daily.  Dispense: 100 tablet; Refill: 2 -     Trulicity; Inject 3 mg into the skin once a week. INJECT 3MG  (1 PEN) UNDER THE SKIN EVERY WEEK  Dispense: 6 mL; Refill: 3 -     metFORMIN HCl ER; Take 1 tablet (500 mg total) by mouth 2 (two) times daily with a meal.  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  Dispense: 100 tablet; Refill: 2  Accelerated essential hypertension -     Metoprolol Tartrate; Take 1 tablet (50 mg total) by mouth daily.  Dispense: 200 tablet; Refill: 2  Other orders -     Olmesartan Medoxomil; Take 1 tablet (40 mg total) by mouth daily. For blood pressure  Dispense: 90 tablet; Refill: 3 -     Pregabalin; 1 qhs X7 days , then 2 qhs X 7d, then 3 qhs X 7d, then 4 qhs  Dispense: 120 capsule; Refill: 0     Follow-up: Return in about 3 months (around 05/18/2024).  Cindy Garrett, M.D.

## 2024-02-18 ENCOUNTER — Encounter: Payer: Self-pay | Admitting: Family Medicine

## 2024-02-18 ENCOUNTER — Other Ambulatory Visit (HOSPITAL_COMMUNITY): Payer: Self-pay

## 2024-02-18 LAB — CMP14+EGFR
ALT: 12 IU/L (ref 0–32)
AST: 17 IU/L (ref 0–40)
Albumin: 4.4 g/dL (ref 3.8–4.9)
Alkaline Phosphatase: 117 IU/L (ref 44–121)
BUN/Creatinine Ratio: 15 (ref 9–23)
BUN: 15 mg/dL (ref 6–24)
Bilirubin Total: 0.4 mg/dL (ref 0.0–1.2)
CO2: 25 mmol/L (ref 20–29)
Calcium: 10 mg/dL (ref 8.7–10.2)
Chloride: 101 mmol/L (ref 96–106)
Creatinine, Ser: 1 mg/dL (ref 0.57–1.00)
Globulin, Total: 2.5 g/dL (ref 1.5–4.5)
Glucose: 128 mg/dL — ABNORMAL HIGH (ref 70–99)
Potassium: 4.9 mmol/L (ref 3.5–5.2)
Sodium: 139 mmol/L (ref 134–144)
Total Protein: 6.9 g/dL (ref 6.0–8.5)
eGFR: 65 mL/min/{1.73_m2} (ref >59)

## 2024-02-18 LAB — CBC WITH DIFFERENTIAL/PLATELET
Basophils Absolute: 0 10*3/uL (ref 0.0–0.2)
Basos: 1 %
EOS (ABSOLUTE): 0.3 10*3/uL (ref 0.0–0.4)
Eos: 5 %
Hematocrit: 38.8 % (ref 34.0–46.6)
Hemoglobin: 13.1 g/dL (ref 11.1–15.9)
Immature Grans (Abs): 0 10*3/uL (ref 0.0–0.1)
Immature Granulocytes: 0 %
Lymphocytes Absolute: 1.3 10*3/uL (ref 0.7–3.1)
Lymphs: 26 %
MCH: 29.6 pg (ref 26.6–33.0)
MCHC: 33.8 g/dL (ref 31.5–35.7)
MCV: 88 fL (ref 79–97)
Monocytes Absolute: 0.3 10*3/uL (ref 0.1–0.9)
Monocytes: 7 %
Neutrophils Absolute: 3.2 10*3/uL (ref 1.4–7.0)
Neutrophils: 61 %
Platelets: 426 10*3/uL (ref 150–450)
RBC: 4.43 x10E6/uL (ref 3.77–5.28)
RDW: 12.8 % (ref 11.7–15.4)
WBC: 5.2 10*3/uL (ref 3.4–10.8)

## 2024-02-18 LAB — LIPID PANEL
Chol/HDL Ratio: 4.6 ratio — ABNORMAL HIGH (ref 0.0–4.4)
Cholesterol, Total: 155 mg/dL (ref 100–199)
HDL: 34 mg/dL — ABNORMAL LOW (ref >39)
LDL Chol Calc (NIH): 99 mg/dL (ref 0–99)
Triglycerides: 121 mg/dL (ref 0–149)
VLDL Cholesterol Cal: 22 mg/dL (ref 5–40)

## 2024-02-18 NOTE — Progress Notes (Signed)
Hello [name],  Your lab result is normal and/or stable.Some minor variations that are not significant are commonly marked abnormal, but do not represent any medical problem for you.  Best regards, Malyssa Maris, M.D.

## 2024-03-11 ENCOUNTER — Encounter (HOSPITAL_COMMUNITY): Payer: Self-pay

## 2024-03-18 ENCOUNTER — Ambulatory Visit (INDEPENDENT_AMBULATORY_CARE_PROVIDER_SITE_OTHER): Admitting: Family Medicine

## 2024-03-18 ENCOUNTER — Encounter: Payer: Self-pay | Admitting: Family Medicine

## 2024-03-18 VITALS — BP 119/65 | HR 65 | Temp 98.3°F | Ht 64.0 in | Wt 185.0 lb

## 2024-03-18 DIAGNOSIS — I10 Essential (primary) hypertension: Secondary | ICD-10-CM

## 2024-03-18 MED ORDER — PREGABALIN 300 MG PO CAPS: 300.0000 mg | ORAL_CAPSULE | Freq: Every day | ORAL | 1 refills | Status: AC

## 2024-03-18 NOTE — Progress Notes (Signed)
 Subjective:  Patient ID: Cindy Garrett, female    DOB: 12-18-1965  Age: 58 y.o. MRN: [id number]  CC: Medication Problem (Pregabalin  started to make her feel nerovus and restlesss feeling once she got up to 4 tablets. )   HPI Cindy Garrett presents for recheck of use of pregabalin . Helping legs,  but not the back . Legs are 2/10. Back is 6/10. Its not as bad as it used to be. Says the first couple of days she took the 4 pills she felt nervous. That has gone away.      03/18/2024   11:05 AM 02/17/2024   11:54 AM 11/20/2023   10:13 AM  Depression screen PHQ 2/9  Decreased Interest 0 0 0  Down, Depressed, Hopeless 0 0 0  PHQ - 2 Score 0 0 0  Altered sleeping 0 0 1  Tired, decreased energy 0 1 3  Change in appetite 0 0 1  Feeling bad or failure about yourself  0 0 0  Trouble concentrating 0 0 3  Moving slowly or fidgety/restless 0 1 0  Suicidal thoughts 0 0 0  PHQ-9 Score 0 2 8  Difficult doing work/chores Not difficult at all Not difficult at all     History Cindy Garrett has a past medical history of Depression, Diabetes mellitus without complication (HCC), Hyperlipidemia, Hypertension, and Stroke (HCC).   She has a past surgical history that includes Tubal ligation; Colonoscopy (N/A, 05/26/2015); LEFT HEART CATH AND CORONARY ANGIOGRAPHY (N/A, 12/10/2016); Coronary artery bypass graft (N/A, 12/10/2016); and TEE without cardioversion (N/A, 12/10/2016).   Her family history includes Diabetes in her father; Heart disease in her mother; Hyperlipidemia in her sister; Hypertension in her sister; Mental illness in her brother.She reports that she quit smoking about 7 years ago. Her smoking use included cigarettes. She started smoking about 37 years ago. She has a 30 pack-year smoking history. She has never used smokeless tobacco. She reports that she does not currently use alcohol. She reports that she does not use drugs.    ROS Review of Systems  Constitutional: Negative.   HENT: Negative.     Eyes:  Negative for visual disturbance.  Respiratory:  Negative for shortness of breath.   Cardiovascular:  Negative for chest pain.  Gastrointestinal:  Negative for abdominal pain.  Musculoskeletal:  Negative for arthralgias.    Objective:  BP 119/65   Pulse 65   Temp 98.3 F (36.8 C)   Ht 5\' 4"  (1.626 m)   Wt 185 lb (83.9 kg)   LMP 08/03/2013   SpO2 99%   BMI 31.76 kg/m   BP Readings from Last 3 Encounters:  03/18/24 119/65  02/17/24 133/72  11/19/23 120/62    Wt Readings from Last 3 Encounters:  03/18/24 185 lb (83.9 kg)  02/17/24 179 lb (81.2 kg)  11/20/23 184 lb (83.5 kg)     Physical Exam Constitutional:      General: She is not in acute distress.    Appearance: She is well-developed.  Cardiovascular:     Rate and Rhythm: Normal rate and regular rhythm.  Pulmonary:     Breath sounds: Normal breath sounds.  Musculoskeletal:        General: Normal range of motion.  Skin:    General: Skin is warm and dry.  Neurological:     Mental Status: She is alert and oriented to person, place, and time.      Assessment & Plan:  Essential (primary) hypertension  Other orders -  Pregabalin ; Take 1 capsule (300 mg total) by mouth at bedtime.  Dispense: 90 capsule; Refill: 1     Follow-up: Return in about 2 months (around 05/18/2024).  Roise Cleaver, M.D.

## 2024-03-23 ENCOUNTER — Telehealth: Payer: Self-pay | Admitting: Family Medicine

## 2024-05-18 ENCOUNTER — Ambulatory Visit: Admitting: Family Medicine

## 2024-05-18 ENCOUNTER — Encounter: Payer: Self-pay | Admitting: Family Medicine

## 2024-05-18 ENCOUNTER — Ambulatory Visit (INDEPENDENT_AMBULATORY_CARE_PROVIDER_SITE_OTHER): Admitting: *Deleted

## 2024-05-18 VITALS — BP 129/68 | HR 58 | Temp 98.2°F | Ht 64.0 in | Wt 188.0 lb

## 2024-05-18 DIAGNOSIS — E1165 Type 2 diabetes mellitus with hyperglycemia: Secondary | ICD-10-CM

## 2024-05-18 DIAGNOSIS — I25118 Atherosclerotic heart disease of native coronary artery with other forms of angina pectoris: Secondary | ICD-10-CM | POA: Diagnosis not present

## 2024-05-18 DIAGNOSIS — E785 Hyperlipidemia, unspecified: Secondary | ICD-10-CM

## 2024-05-18 LAB — CMP14+EGFR
ALT: 18 IU/L (ref 0–32)
AST: 22 IU/L (ref 0–40)
Albumin: 4 g/dL (ref 3.8–4.9)
Alkaline Phosphatase: 129 IU/L — ABNORMAL HIGH (ref 44–121)
BUN/Creatinine Ratio: 14 (ref 9–23)
BUN: 14 mg/dL (ref 6–24)
Bilirubin Total: 0.4 mg/dL (ref 0.0–1.2)
CO2: 23 mmol/L (ref 20–29)
Calcium: 9.8 mg/dL (ref 8.7–10.2)
Chloride: 106 mmol/L (ref 96–106)
Creatinine, Ser: 0.97 mg/dL (ref 0.57–1.00)
Globulin, Total: 2.4 g/dL (ref 1.5–4.5)
Glucose: 120 mg/dL — ABNORMAL HIGH (ref 70–99)
Potassium: 4.6 mmol/L (ref 3.5–5.2)
Sodium: 143 mmol/L (ref 134–144)
Total Protein: 6.4 g/dL (ref 6.0–8.5)
eGFR: 67 mL/min/1.73 (ref >59)

## 2024-05-18 LAB — LIPID PANEL
Chol/HDL Ratio: 3.8 ratio (ref 0.0–4.4)
Cholesterol, Total: 124 mg/dL (ref 100–199)
HDL: 33 mg/dL — ABNORMAL LOW (ref >39)
LDL Chol Calc (NIH): 69 mg/dL (ref 0–99)
Triglycerides: 119 mg/dL (ref 0–149)
VLDL Cholesterol Cal: 22 mg/dL (ref 5–40)

## 2024-05-18 LAB — HGB A1C W/O EAG: Hgb A1c MFr Bld: 6.2 % — ABNORMAL HIGH (ref 4.8–5.6)

## 2024-05-18 LAB — HM DIABETES EYE EXAM

## 2024-05-18 MED ORDER — METFORMIN HCL ER 500 MG PO TB24: 500.0000 mg | ORAL_TABLET | Freq: Two times a day (BID) | ORAL | 2 refills | Status: DC

## 2024-05-18 MED ORDER — TRULICITY 3 MG/0.5ML ~~LOC~~ SOAJ: 3.0000 mg | SUBCUTANEOUS | 3 refills | Status: DC

## 2024-05-18 NOTE — Progress Notes (Signed)
 Subjective:  Patient ID: Cindy Garrett,  female    DOB: 06-14-1966  Age: 58 y.o.    CC: Diabetes   HPI Cindy Garrett presents for  follow-up of hypertension. Patient has no history of headache chest pain or shortness of breath or recent cough. Patient also denies symptoms of TIA such as numbness weakness lateralizing. Patient denies side effects from medication. States taking it regularly.  Patient also  in for follow-up of elevated cholesterol. Doing well without complaints on current medication. Denies side effects  including myalgia and arthralgia and nausea. Also in today for liver function testing. Currently no chest pain, shortness of breath or other cardiovascular related symptoms noted.  Follow-up of diabetes. Patient does check blood sugar at home.  Her levels are rather random because of her work schedule.  It is unclear exactly how long she fasts but it is more likely 4 to 6 hours rather than 8 hours due to her daytime sleeping nighttime working schedule.  Patient denies symptoms such as excessive hunger or urinary frequency, excessive hunger, nausea No significant hypoglycemic spells noted. Medications reviewed. Pt reports taking them regularly. Pt. denies complication/adverse reaction today.    History Drake has a past medical history of Depression, Diabetes mellitus without complication (HCC), Hyperlipidemia, Hypertension, and Stroke (HCC).   She has a past surgical history that includes Tubal ligation; Colonoscopy (N/A, 05/26/2015); LEFT HEART CATH AND CORONARY ANGIOGRAPHY (N/A, 12/10/2016); Coronary artery bypass graft (N/A, 12/10/2016); and TEE without cardioversion (N/A, 12/10/2016).   Her family history includes Diabetes in her father; Heart disease in her mother; Hyperlipidemia in her sister; Hypertension in her sister; Mental illness in her brother.She reports that she quit smoking about 7 years ago. Her smoking use included cigarettes. She started smoking about 37 years  ago. She has a 30 pack-year smoking history. She has never used smokeless tobacco. She reports that she does not currently use alcohol. She reports that she does not use drugs.  Current Outpatient Medications on File Prior to Visit  Medication Sig Dispense Refill   amLODipine  (NORVASC ) 10 MG tablet Take 1 tablet (10 mg total) by mouth daily. 90 tablet 3   aspirin  EC 81 MG tablet Take 1 tablet (81 mg total) by mouth daily. Swallow whole.     atorvastatin  (LIPITOR ) 80 MG tablet Take 1 tablet (80 mg total) by mouth daily. 100 tablet 2   augmented betamethasone  dipropionate (DIPROLENE -AF) 0.05 % cream Apply topically 2 (two) times daily. At affected areas (avoid face and genitals) 50 g 1   blood glucose meter kit and supplies KIT Dispense based on patient and insurance preference. Use up to four times daily as directed. (FOR ICD-10 : E11.9 1 each 0   fluticasone  (FLONASE ) 50 MCG/ACT nasal spray Place 2 sprays into both nostrils daily. 16 g 0   glucose blood (TRUE METRIX BLOOD GLUCOSE TEST) test strip Test BS up to four times daily Dx E11.9 400 strip 3   Lancet Device MISC 2 Units by Does not apply route in the morning and at bedtime. 1 each PRN   metoprolol  tartrate (LOPRESSOR ) 50 MG tablet Take 1 tablet (50 mg total) by mouth daily. 200 tablet 2   olmesartan  (BENICAR ) 40 MG tablet Take 1 tablet (40 mg total) by mouth daily. For blood pressure 90 tablet 3   pregabalin  (LYRICA ) 300 MG capsule Take 1 capsule (300 mg total) by mouth at bedtime. 90 capsule 1   TRUEplus Lancets 33G MISC Test BS up  to four times daily Dx E11.9 400 each 3   loratadine  (CLARITIN ) 10 MG tablet Take 1 tablet (10 mg total) by mouth daily. 30 tablet 0   No current facility-administered medications on file prior to visit.    ROS Review of Systems  Constitutional: Negative.   HENT:  Negative for congestion.   Eyes:  Negative for visual disturbance.  Respiratory:  Negative for shortness of breath.   Cardiovascular:  Negative  for chest pain.  Gastrointestinal:  Negative for abdominal pain, constipation, diarrhea, nausea and vomiting.  Genitourinary:  Negative for difficulty urinating.  Musculoskeletal:  Negative for arthralgias and myalgias.  Neurological:  Negative for headaches.  Psychiatric/Behavioral:  Negative for sleep disturbance.     Objective:  BP 129/68   Pulse (!) 58   Temp 98.2 F (36.8 C)   Ht 5' 4 (1.626 m)   Wt 188 lb (85.3 kg)   LMP 08/03/2013   SpO2 99%   BMI 32.27 kg/m   BP Readings from Last 3 Encounters:  05/18/24 129/68  03/18/24 119/65  02/17/24 133/72    Wt Readings from Last 3 Encounters:  05/18/24 188 lb (85.3 kg)  03/18/24 185 lb (83.9 kg)  02/17/24 179 lb (81.2 kg)    Lab Results  Component Value Date   HGBA1C 5.7 (H) 02/17/2024   HGBA1C 6.0 (H) 11/19/2023   HGBA1C 6.0 (H) 05/26/2023    Physical Exam Constitutional:      General: She is not in acute distress.    Appearance: She is well-developed.  Cardiovascular:     Rate and Rhythm: Normal rate and regular rhythm.  Pulmonary:     Breath sounds: Normal breath sounds.  Musculoskeletal:        General: Normal range of motion.  Skin:    General: Skin is warm and dry.  Neurological:     Mental Status: She is alert and oriented to person, place, and time.         Assessment & Plan:  Type 2 diabetes mellitus with hyperglycemia, without long-term current use of insulin  (HCC) -     Hgb A1c w/o eAG -     Trulicity ; Inject 3 mg into the skin once a week. INJECT 3MG  (1 PEN) UNDER THE SKIN EVERY WEEK  Dispense: 6 mL; Refill: 3 -     metFORMIN  HCl ER; Take 1 tablet (500 mg total) by mouth 2 (two) times daily with a meal.  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  Butler Der, M.D.

## 2024-05-18 NOTE — Progress Notes (Signed)
 Arrived on 05/18/2024 and has given verbal consent to obtain images and complete their overdue diabetic retinal screening.  The images have been sent to an ophthalmologist or optometrist for review and interpretation.  Results will be sent back to [facility] for review.  Patient has been informed they will be contacted when we receive the results via telephone or MyChart.

## 2024-05-24 ENCOUNTER — Ambulatory Visit: Payer: Self-pay | Admitting: Family Medicine

## 2024-06-11 ENCOUNTER — Other Ambulatory Visit: Payer: Self-pay | Admitting: Family Medicine

## 2024-06-11 DIAGNOSIS — Z1231 Encounter for screening mammogram for malignant neoplasm of breast: Secondary | ICD-10-CM

## 2024-06-14 ENCOUNTER — Inpatient Hospital Stay: Admission: RE | Admit: 2024-06-14 | Source: Ambulatory Visit

## 2024-06-24 ENCOUNTER — Other Ambulatory Visit: Payer: Self-pay | Admitting: Family Medicine

## 2024-06-24 DIAGNOSIS — E1165 Type 2 diabetes mellitus with hyperglycemia: Secondary | ICD-10-CM

## 2024-07-12 ENCOUNTER — Other Ambulatory Visit: Payer: Self-pay | Admitting: Family Medicine

## 2024-07-12 DIAGNOSIS — I25118 Atherosclerotic heart disease of native coronary artery with other forms of angina pectoris: Secondary | ICD-10-CM

## 2024-07-12 DIAGNOSIS — E1165 Type 2 diabetes mellitus with hyperglycemia: Secondary | ICD-10-CM

## 2024-07-12 DIAGNOSIS — E785 Hyperlipidemia, unspecified: Secondary | ICD-10-CM

## 2024-07-12 DIAGNOSIS — I10 Essential (primary) hypertension: Secondary | ICD-10-CM

## 2024-07-12 MED ORDER — TRUEPLUS LANCETS 33G MISC: 3 refills | Status: AC

## 2024-08-18 ENCOUNTER — Ambulatory Visit: Admitting: Family Medicine

## 2024-08-20 ENCOUNTER — Other Ambulatory Visit: Payer: Self-pay

## 2024-08-20 ENCOUNTER — Emergency Department (HOSPITAL_COMMUNITY)
Admission: EM | Admit: 2024-08-20 | Discharge: 2024-08-20 | Disposition: A | Discharge status: Home / Self Care | Attending: Emergency Medicine | Admitting: Emergency Medicine

## 2024-08-20 ENCOUNTER — Encounter (HOSPITAL_COMMUNITY): Payer: Self-pay | Admitting: Emergency Medicine

## 2024-08-20 DIAGNOSIS — E119 Type 2 diabetes mellitus without complications: Secondary | ICD-10-CM | POA: Insufficient documentation

## 2024-08-20 DIAGNOSIS — Z7982 Long term (current) use of aspirin: Secondary | ICD-10-CM | POA: Insufficient documentation

## 2024-08-20 DIAGNOSIS — Z7984 Long term (current) use of oral hypoglycemic drugs: Secondary | ICD-10-CM | POA: Insufficient documentation

## 2024-08-20 DIAGNOSIS — I2510 Atherosclerotic heart disease of native coronary artery without angina pectoris: Secondary | ICD-10-CM | POA: Insufficient documentation

## 2024-08-20 DIAGNOSIS — M5416 Radiculopathy, lumbar region: Secondary | ICD-10-CM | POA: Diagnosis not present

## 2024-08-20 MED ORDER — METHYLPREDNISOLONE 4 MG PO TBPK: ORAL_TABLET | ORAL | 0 refills | Status: AC

## 2024-08-20 MED ORDER — LIDOCAINE 5 % EX PTCH: 1.0000 | MEDICATED_PATCH | CUTANEOUS | 0 refills | Status: AC

## 2024-08-20 MED ORDER — OXYCODONE HCL 5 MG PO TABS: 5.0000 mg | ORAL_TABLET | ORAL | 0 refills | Status: AC | PRN

## 2024-08-20 MED ORDER — METHYLPREDNISOLONE SODIUM SUCC 40 MG IJ SOLR
40.0000 mg | Freq: Once | INTRAMUSCULAR | Status: AC
  Administered 2024-08-20: 40 mg via INTRAMUSCULAR
  Filled 2024-08-20: qty 1

## 2024-08-20 MED ORDER — METHYLPREDNISOLONE SODIUM SUCC 40 MG IJ SOLR: 40.0000 mg | Freq: Once | INTRAMUSCULAR | Status: DC

## 2024-08-20 MED ORDER — OXYCODONE HCL 5 MG PO TABS
5.0000 mg | ORAL_TABLET | ORAL | Status: AC
  Administered 2024-08-20: 5 mg via ORAL
  Filled 2024-08-20: qty 1

## 2024-08-20 MED ADMIN — Lidocaine Patch 5%: 1 | TRANSDERMAL | NDC 65162079104

## 2024-08-20 MED FILL — Lidocaine Patch 5%: 1.0000 | CUTANEOUS | Qty: 1 | Status: AC

## 2024-08-20 NOTE — ED Notes (Signed)
 Patient is calling a ride to pick her up and she will wait in room until her ride gets here. Patient has been discharged.

## 2024-08-20 NOTE — ED Provider Notes (Signed)
 [place] EMERGENCY DEPARTMENT AT [facility] Provider Note   CSN: [id number] Arrival date & time: 08/20/24  9446     Patient presents with: Back Pain   Cindy Garrett is a 58 y.o. female.   58 year old female history of CAD, DM, and back pain who presents to the emergency department with acute on chronic back pain.  Patient reports that for the past 4 months she has been having low back pain.  Attributes this to standing at work where she works in [employer].  Says that yesterday the pain got worse.  Says it is a dull pain in her low back.  Does have tingling pain that goes down both of her legs to the knees.  Worsened with movement.  No bowel or bladder incontinence.  No leg weakness or numbness or saddle anesthesia.  Has not been trying any medication for it at home yet because the pregabalin  that was prescribed makes her too groggy  No back surgeries.  Not on anticoagulation.  No history of cancer.       Prior to Admission medications   Medication Sig Start Date End Date Taking? Authorizing Provider  lidocaine  (LIDODERM ) 5 % Place 1 patch onto the skin daily. Remove & Discard patch within 12 hours or as directed by MD 08/20/24  Yes Yolande Lamar BROCKS, MD  methylPREDNISolone (MEDROL DOSEPAK) 4 MG TBPK tablet Take as directed on packaging 08/20/24  Yes Yolande Lamar BROCKS, MD  oxyCODONE  (ROXICODONE ) 5 MG immediate release tablet Take 1 tablet (5 mg total) by mouth every 4 (four) hours as needed for severe pain (pain score 7-10). 08/20/24  Yes Yolande Lamar BROCKS, MD  amLODipine  (NORVASC ) 10 MG tablet TAKE 1 TABLET BY MOUTH DAILY 07/12/24   Zollie Lowers, MD  aspirin  EC 81 MG tablet Take 1 tablet (81 mg total) by mouth daily. Swallow whole. 11/19/23   Zollie Lowers, MD  atorvastatin  (LIPITOR ) 80 MG tablet TAKE 1 TABLET BY MOUTH DAILY 07/12/24   Zollie Lowers, MD  augmented betamethasone  dipropionate (DIPROLENE -AF) 0.05 % cream Apply topically 2 (two) times daily. At affected  areas (avoid face and genitals) 11/19/23   Zollie Lowers, MD  blood glucose meter kit and supplies KIT Dispense based on patient and insurance preference. Use up to four times daily as directed. (FOR ICD-10 : E11.9 05/29/23   Zollie Lowers, MD  Dulaglutide  (TRULICITY ) 3 MG/0.5ML [facility] THE CONTENTS OF ONE PEN  SUBCUTANEOUSLY WEEKLY AS  DIRECTED 06/24/24   Zollie Lowers, MD  fluticasone  (FLONASE ) 50 MCG/ACT nasal spray Place 2 sprays into both nostrils daily. 02/05/21   Couture, Cortni S, PA-C  glucose blood (TRUE METRIX BLOOD GLUCOSE TEST) test strip Test BS up to four times daily Dx E11.9 06/23/23   Zollie Lowers, MD  Lancet Device MISC 2 Units by Does not apply route in the morning and at bedtime. 05/26/23   Zollie Lowers, MD  loratadine  (CLARITIN ) 10 MG tablet Take 1 tablet (10 mg total) by mouth daily. 02/05/21 07/10/22  Couture, Cortni S, PA-C  metFORMIN  (GLUCOPHAGE -XR) 500 MG 24 hr tablet TAKE 1 TABLET BY MOUTH TWICE  DAILY WITH MEALS 07/12/24   Zollie Lowers, MD  metoprolol  tartrate (LOPRESSOR ) 50 MG tablet TAKE 1 TABLET BY MOUTH DAILY 07/12/24   Zollie Lowers, MD  olmesartan  (BENICAR ) 40 MG tablet TAKE 1 TABLET BY MOUTH DAILY FOR BLOOD PRESSURE 07/12/24   Zollie Lowers, MD  pregabalin  (LYRICA ) 300 MG capsule Take 1 capsule (300 mg total) by mouth  at bedtime. 03/18/24   Zollie Lowers, MD  TRUEplus Lancets 33G MISC Test BS up to four times daily Dx E11.9 07/12/24   Zollie Lowers, MD    Allergies: Lisinopril  and Penicillins    Review of Systems  Updated Vital Signs BP (!) 148/71   Pulse (!) 58   Temp 97.8 F (36.6 C) (Oral)   Resp 19   Ht 5' 3 (1.6 m)   Wt 85.3 kg   LMP 08/03/2013   SpO2 99%   BMI 33.30 kg/m   Physical Exam Cardiovascular:     Comments: DP pulses 2+ bilaterally Musculoskeletal:     Comments: No spinal midline TTP in thoracic spine. No stepoffs noted.  Midline tenderness to palpation at L1 and L2.  No step-offs.  Motor: Muscle bulk and tone are normal. Strength  is 5/5 in hip flexion, knee flexion and extension, ankle dorsiflexion and plantar flexion bilaterally. Full strength of great toe dorsiflexion bilaterally.  Sensory: Intact sensation to light touch in L2 though S1 dermatomes bilaterally.   Reflexes: Patellar 2+ bilaterally, Achilles 2+ bilaterally, no ankle clonus bilaterally     (all labs ordered are listed, but only abnormal results are displayed) Labs Reviewed - No data to display  EKG: None  Radiology: No results found.   Procedures   Medications Ordered in the ED  lidocaine  (LIDODERM ) 5 % 1 patch (1 patch Transdermal Patch Applied 08/20/24 0741)  oxyCODONE  (Oxy IR/ROXICODONE ) immediate release tablet 5 mg (5 mg Oral Given 08/20/24 0742)  methylPREDNISolone sodium succinate (SOLU-MEDROL) 40 mg/mL injection 40 mg (40 mg Intramuscular Given 08/20/24 0739)                                    Medical Decision Making Risk Prescription drug management.   Cindy Garrett is a 58 y.o. female with comorbidities that complicate the patient evaluation including CAD, diabetes, and back pain  Initial Ddx:  Lumbar radiculopathy, spinal cord compression, pathologic fracture, spinal epidural abscess, spinal epidural hematoma  MDM:  Feel the patient likely has lumbar radiculopathy given their symptoms.  No signs or symptoms of cord compression such as bowel or bladder incontinence or numbness or weakness that would warrant MRI.  Low risk for pathologic fracture so do not feel that imaging is warranted at this time.  No risk factors for spinal epidural abscess or spinal epidural hematoma either.  Unfortunately not the best candidate for NSAIDs with her CAD history.  Will give her some oxycodone  to help her sleep at night but recommend Tylenol  and lidocaine  patches during the day with surgery follow-up.  Will also give a Medrol Dosepak  This patient presents to the ED for concern of complaints listed in HPI, this involves an extensive  number of treatment options, and is a complaint that carries with it a high risk of complications and morbidity. Disposition including potential need for admission considered.   Dispo: DC Home. Return precautions discussed including, but not limited to, those listed in the AVS. Allowed pt time to ask questions which were answered fully prior to dc.  Records reviewed Outpatient Clinic Notes I have reviewed the patients home medications and made adjustments as needed   Final diagnoses:  Lumbar radiculopathy    ED Discharge Orders          Ordered    lidocaine  (LIDODERM ) 5 %  Every 24 hours  08/20/24 0729    methylPREDNISolone (MEDROL DOSEPAK) 4 MG TBPK tablet        08/20/24 0729    oxyCODONE  (ROXICODONE ) 5 MG immediate release tablet  Every 4 hours PRN        08/20/24 0729               Yolande Lamar BROCKS, MD 08/20/24 [id number]

## 2024-08-20 NOTE — Discharge Instructions (Signed)
 You were seen for your back pain in the emergency department.   At home, please use over-the-counter Tylenol  and lidocaine  patches.  You may also use the oxycodone  we have prescribed you as needed for pain.  Do not take the oxycodone  before driving or operating heavy machinery.  Take the steroid Dosepak that we have prescribed you as well.  Follow-up with your primary doctor in 2-3 days regarding your visit.  Follow-up with the spine clinic as soon as possible regarding your symptoms.  Return immediately to the emergency department if you experience any of the following: Numbness or weakness of your legs, bowel or bladder incontinence, numbness while wiping after pooping or urinating, or any other concerning symptoms.    Thank you for visiting our Emergency Department. It was a pleasure taking care of you today.

## 2024-08-20 NOTE — ED Triage Notes (Signed)
 Lower back pain, radiates to both legs. Denies loss of control of bowel or bladder. No recent injuries. States started about  4 months ago. Flared up yesterday while at work. States she takes pregabalin  as she needs it but can't always take it because it makes her to groggy for work. States pain is worse when moving around.

## 2024-08-24 NOTE — Progress Notes (Signed)
 Cindy Garrett                                          MRN: [id number]   08/24/2024   The [facility] Quality Team Specialist reviewed this patient medical record for the purposes of chart review for care gap closure. The following were reviewed: chart review for care gap closure-kidney health evaluation for diabetes:eGFR  and uACR.    [facility] Quality Team

## 2024-08-25 ENCOUNTER — Ambulatory Visit (INDEPENDENT_AMBULATORY_CARE_PROVIDER_SITE_OTHER): Admitting: Family Medicine

## 2024-08-25 ENCOUNTER — Encounter: Payer: Self-pay | Admitting: Family Medicine

## 2024-08-25 VITALS — BP 134/70 | HR 55 | Temp 98.0°F | Ht 63.0 in | Wt 184.8 lb

## 2024-08-25 DIAGNOSIS — M5416 Radiculopathy, lumbar region: Secondary | ICD-10-CM

## 2024-08-25 DIAGNOSIS — E785 Hyperlipidemia, unspecified: Secondary | ICD-10-CM

## 2024-08-25 DIAGNOSIS — Z1321 Encounter for screening for nutritional disorder: Secondary | ICD-10-CM | POA: Diagnosis not present

## 2024-08-25 DIAGNOSIS — E1165 Type 2 diabetes mellitus with hyperglycemia: Secondary | ICD-10-CM | POA: Diagnosis not present

## 2024-08-25 DIAGNOSIS — I10 Essential (primary) hypertension: Secondary | ICD-10-CM

## 2024-08-25 LAB — BAYER DCA HB A1C WAIVED: HB A1C (BAYER DCA - WAIVED): 5.7 % — ABNORMAL HIGH (ref 4.8–5.6)

## 2024-08-25 NOTE — Progress Notes (Signed)
 Subjective:  Patient ID: Cindy Garrett, female    DOB: Jun 26, 1966  Age: 58 y.o. MRN: [id number]  CC: Medical Management of Chronic Issues   HPI  Discussed the use of AI scribe software for clinical note transcription with the patient, who gave verbal consent to proceed.  History of Present Illness Cindy Garrett is a 58 year old female with diabetes and hypertension who presents for follow-up on her diabetes management and leg weakness.  She has not been checking her blood sugar at home due to starting a new job. She works from 5 AM to 12 PM and typically eats around 12:30 PM. She has experienced several episodes where she felt her blood sugar was too high, causing her to feel as though she might pass out, but she has not experienced any episodes of hypoglycemia. Her last A1c was 5.7%.  She is currently taking amlodipine , metoprolol , and Benicar  for hypertension, and Lyrica  (pregabalin ) for pain management.  Recently, she experienced an episode of leg weakness where she was unable to move her legs after getting up in the morning, which led to a hospital visit. The episode lasted a couple of hours, and she required assistance from ambulance personnel to get to the hospital. No MRI has been done yet.  No abdominal pain, constipation, or diarrhea. She denies any episodes of low blood sugar causing nausea, lack of hunger, or fainting.          08/25/2024   11:25 AM 05/18/2024   10:35 AM 03/18/2024   11:05 AM  Depression screen PHQ 2/9  Decreased Interest 0 0 0  Down, Depressed, Hopeless 0 0 0  PHQ - 2 Score 0 0 0  Altered sleeping 0 1 0  Tired, decreased energy 0 0 0  Change in appetite 1 0 0  Feeling bad or failure about yourself  0 0 0  Trouble concentrating 0 0 0  Moving slowly or fidgety/restless 0 0 0  Suicidal thoughts 0 0 0  PHQ-9 Score 1 1 0  Difficult doing work/chores   Not difficult at all    History Cindy Garrett has a past medical history of Depression, Diabetes  mellitus without complication (HCC), Hyperlipidemia, Hypertension, and Stroke (HCC).   She has a past surgical history that includes Tubal ligation; Colonoscopy (N/A, 05/26/2015); LEFT HEART CATH AND CORONARY ANGIOGRAPHY (N/A, 12/10/2016); Coronary artery bypass graft (N/A, 12/10/2016); and TEE without cardioversion (N/A, 12/10/2016).   Her family history includes Diabetes in her father; Heart disease in her mother; Hyperlipidemia in her sister; Hypertension in her sister; Mental illness in her brother.She reports that she quit smoking about 7 years ago. Her smoking use included cigarettes. She started smoking about 37 years ago. She has a 30 pack-year smoking history. She has never used smokeless tobacco. She reports that she does not currently use alcohol. She reports that she does not use drugs.    ROS Review of Systems  Constitutional: Negative.   HENT:  Negative for congestion.   Eyes:  Negative for visual disturbance.  Respiratory:  Negative for shortness of breath.   Cardiovascular:  Negative for chest pain.  Gastrointestinal:  Negative for abdominal pain, constipation, diarrhea, nausea and vomiting.  Genitourinary:  Negative for difficulty urinating.  Musculoskeletal:  Negative for arthralgias and myalgias.  Neurological:  Negative for headaches.  Psychiatric/Behavioral:  Negative for sleep disturbance.     Objective:  BP 134/70   Pulse (!) 55   Temp 98 F (36.7 C)   Ht  5' 3 (1.6 m)   Wt 184 lb 12.8 oz (83.8 kg)   LMP 08/03/2013   SpO2 99%   BMI 32.74 kg/m   BP Readings from Last 3 Encounters:  08/25/24 134/70  08/20/24 (!) 154/74  05/18/24 129/68    Wt Readings from Last 3 Encounters:  08/25/24 184 lb 12.8 oz (83.8 kg)  08/20/24 188 lb (85.3 kg)  05/18/24 188 lb (85.3 kg)     Physical Exam Physical Exam VITALS: BP- 134/70 GENERAL: Alert, cooperative, well developed, no acute distress. HEENT: Normocephalic, normal oropharynx, moist mucous membranes. CHEST: Clear  to auscultation bilaterally, no wheezes, rhonchi, or crackles. CARDIOVASCULAR: Normal heart rate and rhythm, S1 and S2 normal without murmurs. ABDOMEN: Soft, non-tender, non-distended, without organomegaly, normal bowel sounds. EXTREMITIES: No cyanosis or edema. NEUROLOGICAL: Cranial nerves grossly intact, moves all extremities without gross motor or sensory deficit.   Assessment & Plan:  Lumbar radiculopathy -     MR LUMBAR SPINE WO CONTRAST; Future -     Ambulatory referral to Neurosurgery  Encounter for vitamin deficiency screening -     CMP14+EGFR -     Microalbumin / creatinine urine ratio -     VITAMIN D 25 Hydroxy (Vit-D Deficiency, Fractures)  Type 2 diabetes mellitus with hyperglycemia, without long-term current use of insulin  (HCC) -     Bayer DCA Hb A1c Waived -     Vitamin B12  Dyslipidemia -     Lipid panel  Essential (primary) hypertension -     CBC with Differential/Platelet    Assessment and Plan Assessment & Plan Lower extremity weakness and pain, possible lumbar radiculopathy   She experienced a recent episode of lower extremity weakness lasting a few hours, prompting a hospital visit. The hospital physician suspects possible lumbar radiculopathy, but no MRI has been performed yet. An MRI of the lumbar spine will be ordered, and a referral to a neurosurgeon is necessary for further evaluation.  Type 2 diabetes mellitus with hyperglycemia   Diabetes management is effective with an A1c of 5.7%. She is not checking her blood sugar at home due to work schedule changes and reports no hypoglycemia. There are occasional suspected hyperglycemia episodes with symptoms of feeling faint, though not confirmed by blood sugar readings. A blood sugar log sheet will be provided for home monitoring. She should check her blood sugar upon waking and two hours after a large meal, and bring the log sheet to the next appointment.  Essential hypertension   Her blood pressure is  well-controlled at 134/70 mmHg with the current medication regimen, including amlodipine , metoprolol , and olmesartan .       Follow-up: Return in about 3 months (around 11/25/2024).  Butler Der, M.D.

## 2024-08-26 LAB — CMP14+EGFR
ALT: 11 IU/L (ref 0–32)
AST: 18 IU/L (ref 0–40)
Albumin: 4.1 g/dL (ref 3.8–4.9)
Alkaline Phosphatase: 109 IU/L (ref 49–135)
BUN/Creatinine Ratio: 17 (ref 9–23)
BUN: 15 mg/dL (ref 6–24)
Bilirubin Total: 0.4 mg/dL (ref 0.0–1.2)
CO2: 25 mmol/L (ref 20–29)
Calcium: 9.1 mg/dL (ref 8.7–10.2)
Chloride: 106 mmol/L (ref 96–106)
Creatinine, Ser: 0.86 mg/dL (ref 0.57–1.00)
Globulin, Total: 2.2 g/dL (ref 1.5–4.5)
Glucose: 101 mg/dL — ABNORMAL HIGH (ref 70–99)
Potassium: 4.3 mmol/L (ref 3.5–5.2)
Sodium: 141 mmol/L (ref 134–144)
Total Protein: 6.3 g/dL (ref 6.0–8.5)
eGFR: 78 mL/min/1.73 (ref >59)

## 2024-08-26 LAB — CBC WITH DIFFERENTIAL/PLATELET
Basophils Absolute: 0 x10E3/uL (ref 0.0–0.2)
Basos: 1 %
EOS (ABSOLUTE): 0.2 x10E3/uL (ref 0.0–0.4)
Eos: 3 %
Hematocrit: 37.7 % (ref 34.0–46.6)
Hemoglobin: 12.3 g/dL (ref 11.1–15.9)
Immature Grans (Abs): 0 x10E3/uL (ref 0.0–0.1)
Immature Granulocytes: 0 %
Lymphocytes Absolute: 1.9 x10E3/uL (ref 0.7–3.1)
Lymphs: 33 %
MCH: 29.6 pg (ref 26.6–33.0)
MCHC: 32.6 g/dL (ref 31.5–35.7)
MCV: 91 fL (ref 79–97)
Monocytes Absolute: 0.5 x10E3/uL (ref 0.1–0.9)
Monocytes: 8 %
Neutrophils Absolute: 3.3 x10E3/uL (ref 1.4–7.0)
Neutrophils: 55 %
Platelets: 362 x10E3/uL (ref 150–450)
RBC: 4.16 x10E6/uL (ref 3.77–5.28)
RDW: 13 % (ref 11.7–15.4)
WBC: 5.9 x10E3/uL (ref 3.4–10.8)

## 2024-08-26 LAB — VITAMIN D 25 HYDROXY (VIT D DEFICIENCY, FRACTURES): Vit D, 25-Hydroxy: 12.9 ng/mL — AB (ref 30.0–100.0)

## 2024-08-26 LAB — MICROALBUMIN / CREATININE URINE RATIO
Creatinine, Urine: 138.4 mg/dL
Microalb/Creat Ratio: 39 mg/g{creat} — ABNORMAL HIGH (ref 0–29)
Microalbumin, Urine: 54.3 ug/mL

## 2024-08-26 LAB — LIPID PANEL
Chol/HDL Ratio: 3.9 ratio (ref 0.0–4.4)
Cholesterol, Total: 142 mg/dL (ref 100–199)
HDL: 36 mg/dL — ABNORMAL LOW (ref >39)
LDL Chol Calc (NIH): 77 mg/dL (ref 0–99)
Triglycerides: 169 mg/dL — ABNORMAL HIGH (ref 0–149)
VLDL Cholesterol Cal: 29 mg/dL (ref 5–40)

## 2024-08-26 LAB — VITAMIN B12: Vitamin B-12: 348 pg/mL (ref 232–1245)

## 2024-08-31 ENCOUNTER — Ambulatory Visit (HOSPITAL_COMMUNITY)

## 2024-09-01 ENCOUNTER — Ambulatory Visit: Payer: Self-pay | Admitting: Family Medicine

## 2024-09-01 DIAGNOSIS — E559 Vitamin D deficiency, unspecified: Secondary | ICD-10-CM

## 2024-09-01 NOTE — Progress Notes (Signed)
 Dear Cindy Garrett, Your Vitamin D is  low. You need a prescription strength supplement I will send that in for you. Nurse, if at all possible, could you send in a prescription for the patient for vitamin D 50,000 units, 1 p.o. weekly #13 with 3 refills? Many thanks, WS

## 2024-09-03 LAB — MICROALBUMIN/CREATININE RATIO, UR: Microalb Creat Ratio: 30

## 2024-09-06 MED ORDER — VITAMIN D (ERGOCALCIFEROL) 1.25 MG (50000 UNIT) PO CAPS: 50000.0000 [IU] | ORAL_CAPSULE | ORAL | 3 refills | Status: AC

## 2024-09-08 ENCOUNTER — Ambulatory Visit (HOSPITAL_COMMUNITY)
Admission: RE | Admit: 2024-09-08 | Discharge: 2024-09-08 | Disposition: A | Discharge status: Home / Self Care | Source: Ambulatory Visit | Attending: Family Medicine | Admitting: Family Medicine

## 2024-09-08 DIAGNOSIS — M48061 Spinal stenosis, lumbar region without neurogenic claudication: Secondary | ICD-10-CM

## 2024-09-08 DIAGNOSIS — M4316 Spondylolisthesis, lumbar region: Secondary | ICD-10-CM

## 2024-09-08 DIAGNOSIS — M5416 Radiculopathy, lumbar region: Secondary | ICD-10-CM | POA: Diagnosis present

## 2024-09-08 DIAGNOSIS — M47816 Spondylosis without myelopathy or radiculopathy, lumbar region: Secondary | ICD-10-CM

## 2024-09-14 ENCOUNTER — Other Ambulatory Visit: Payer: Self-pay | Admitting: Family Medicine

## 2024-09-14 DIAGNOSIS — M5416 Radiculopathy, lumbar region: Secondary | ICD-10-CM

## 2024-09-15 ENCOUNTER — Other Ambulatory Visit: Payer: Self-pay | Admitting: *Deleted

## 2024-09-15 DIAGNOSIS — E1165 Type 2 diabetes mellitus with hyperglycemia: Secondary | ICD-10-CM

## 2024-10-18 NOTE — Progress Notes (Signed)
 Cindy Garrett                                          MRN: [id number]   10/18/2024   The [facility] Quality Team Specialist reviewed this patient medical record for the purposes of chart review for care gap closure. The following were reviewed: abstraction for care gap closure-glycemic status assessment.    [facility] Quality Team

## 2024-10-21 ENCOUNTER — Other Ambulatory Visit: Payer: Self-pay | Admitting: Family Medicine

## 2024-10-21 DIAGNOSIS — I10 Essential (primary) hypertension: Secondary | ICD-10-CM

## 2024-10-21 DIAGNOSIS — E1165 Type 2 diabetes mellitus with hyperglycemia: Secondary | ICD-10-CM

## 2024-10-21 DIAGNOSIS — I25118 Atherosclerotic heart disease of native coronary artery with other forms of angina pectoris: Secondary | ICD-10-CM

## 2024-10-21 DIAGNOSIS — E785 Hyperlipidemia, unspecified: Secondary | ICD-10-CM

## 2024-11-17 NOTE — Progress Notes (Signed)
 Cindy Garrett                                          MRN: [id number]   11/17/2024   The [facility] Quality Team Specialist reviewed this patient medical record for the purposes of chart review for care gap closure. The following were reviewed: chart review for care gap closure-glycemic status assessment.    [facility] Quality Team

## 2024-11-22 ENCOUNTER — Ambulatory Visit: Payer: 59

## 2024-11-26 ENCOUNTER — Ambulatory Visit (INDEPENDENT_AMBULATORY_CARE_PROVIDER_SITE_OTHER)

## 2024-11-26 VITALS — BP 134/70 | HR 55 | Ht 63.0 in | Wt 184.0 lb

## 2024-11-26 DIAGNOSIS — Z0000 Encounter for general adult medical examination without abnormal findings: Secondary | ICD-10-CM

## 2024-11-26 DIAGNOSIS — Z122 Encounter for screening for malignant neoplasm of respiratory organs: Secondary | ICD-10-CM

## 2024-11-26 NOTE — Addendum Note (Signed)
 Addended by: DEBBY SIMPER on: 11/26/2024 11:55 AM   Modules accepted: Level of Service

## 2024-11-26 NOTE — Progress Notes (Signed)
 "  Chief Complaint  Patient presents with   Medicare Wellness     Subjective:   Cindy Garrett is a 59 y.o. female who presents for a Medicare Annual Wellness Visit.  Visit info / Clinical Intake: Medicare Wellness Visit Type:: Subsequent Annual Wellness Visit Persons participating in visit and providing information:: patient Medicare Wellness Visit Mode:: Telephone If telephone:: video declined Since this visit was completed virtually, some vitals may be partially provided or unavailable. Missing vitals are due to the limitations of the virtual format.: Unable to obtain vitals - no equipment If Telephone or Video please confirm:: I connected with patient using audio/video enable telemedicine. I verified patient identity with two identifiers, discussed telehealth limitations, and patient agreed to proceed. Patient Location:: home Provider Location:: office Interpreter Needed?: No Pre-visit prep was completed: yes AWV questionnaire completed by patient prior to visit?: no Living arrangements:: other (roommate) Patient's Overall Health Status Rating: very good Typical amount of pain: none Does pain affect daily life?: no Are you currently prescribed opioids?: no  Dietary Habits and Nutritional Risks How many meals a day?: 2 Eats fruit and vegetables daily?: yes Most meals are obtained by: preparing own meals In the last 2 weeks, have you had any of the following?: unintentional weight loss (due pt had the flu) Diabetic:: (!) yes Any non-healing wounds?: no How often do you check your BS?: 1 Would you like to be referred to a Nutritionist or for Diabetic Management? : no  Functional Status Activities of Daily Living (to include ambulation/medication): Independent Ambulation: Independent Medication Administration: Independent Home Management (perform basic housework or laundry): Independent Manage your own finances?: yes Primary transportation is: driving Concerns about  vision?: no *vision screening is required for WTM* (last ov 58yrs ago) Concerns about hearing?: no  Fall Screening Falls in the past year?: 0 Number of falls in past year: 0 Was there an injury with Fall?: 0 Fall Risk Category Calculator: 0 Patient Fall Risk Level: Low Fall Risk  Fall Risk Patient at Risk for Falls Due to: No Fall Risks Fall risk Follow up: Falls evaluation completed; Education provided  Home and Transportation Safety: All rugs have non-skid backing?: yes All stairs or steps have railings?: (!) no Grab bars in the bathtub or shower?: yes Have non-skid surface in bathtub or shower?: yes Good home lighting?: yes Regular seat belt use?: yes Hospital stays in the last year:: no  Cognitive Assessment Difficulty concentrating, remembering, or making decisions? : no Will 6CIT or Mini Cog be Completed: yes What year is it?: 0 points What month is it?: 0 points Give patient an address phrase to remember (5 components): [address] [place] what time is it?: 0 points Count backwards from 20 to 1: 0 points Say the months of the year in reverse: 0 points Repeat the address phrase from earlier: 0 points 6 CIT Score: 0 points  Advance Directives (For Healthcare) Does Patient Have a Medical Advance Directive?: No Would patient like information on creating a medical advance directive?: No - Patient declined  Reviewed/Updated  Reviewed/Updated: Reviewed All (Medical, Surgical, Family, Medications, Allergies, Care Teams, Patient Goals); Medical History; Surgical History; Family History; Medications; Allergies; Care Teams; Patient Goals    Allergies (verified) Lisinopril  and Penicillins   Current Medications (verified) Outpatient Encounter Medications as of 11/26/2024  Medication Sig   amLODipine  (NORVASC ) 10 MG tablet TAKE 1 TABLET BY MOUTH DAILY   aspirin  EC 81 MG tablet Take 1 tablet (81 mg total) by  mouth daily. Swallow whole.   atorvastatin   (LIPITOR ) 80 MG tablet TAKE 1 TABLET BY MOUTH DAILY   augmented betamethasone  dipropionate (DIPROLENE -AF) 0.05 % cream Apply topically 2 (two) times daily. At affected areas (avoid face and genitals)   blood glucose meter kit and supplies KIT Dispense based on patient and insurance preference. Use up to four times daily as directed. (FOR ICD-10 : E11.9   Dulaglutide  (TRULICITY ) 3 MG/0.5ML SOAJ INJECT THE CONTENTS OF ONE PEN  SUBCUTANEOUSLY WEEKLY AS  DIRECTED   fluticasone  (FLONASE ) 50 MCG/ACT nasal spray Place 2 sprays into both nostrils daily.   glucose blood (TRUE METRIX BLOOD GLUCOSE TEST) test strip Test BS up to four times daily Dx E11.9   Lancet Device MISC 2 Units by Does not apply route in the morning and at bedtime.   lidocaine  (LIDODERM ) 5 % Place 1 patch onto the skin daily. Remove & Discard patch within 12 hours or as directed by MD   loratadine  (CLARITIN ) 10 MG tablet Take 1 tablet (10 mg total) by mouth daily.   metFORMIN  (GLUCOPHAGE -XR) 500 MG 24 hr tablet TAKE 1 TABLET BY MOUTH TWICE  DAILY WITH MEALS   methylPREDNISolone  (MEDROL  DOSEPAK) 4 MG TBPK tablet Take as directed on packaging   metoprolol  tartrate (LOPRESSOR ) 50 MG tablet TAKE 1 TABLET BY MOUTH DAILY   olmesartan  (BENICAR ) 40 MG tablet TAKE 1 TABLET BY MOUTH DAILY FOR BLOOD PRESSURE   oxyCODONE  (ROXICODONE ) 5 MG immediate release tablet Take 1 tablet (5 mg total) by mouth every 4 (four) hours as needed for severe pain (pain score 7-10).   pregabalin  (LYRICA ) 300 MG capsule Take 1 capsule (300 mg total) by mouth at bedtime.   TRUEplus Lancets 33G MISC Test BS up to four times daily Dx E11.9   Vitamin D , Ergocalciferol , (DRISDOL ) 1.25 MG (50000 UNIT) CAPS capsule Take 1 capsule (50,000 Units total) by mouth every 7 (seven) days.   No facility-administered encounter medications on file as of 11/26/2024.    History: Past Medical History:  Diagnosis Date   Depression    Diabetes mellitus without complication (HCC)     Hyperlipidemia    Hypertension    Stroke [facility])    Past Surgical History:  Procedure Laterality Date   COLONOSCOPY N/A 05/26/2015   Procedure: COLONOSCOPY;  Surgeon: Margo LITTIE Haddock, MD;  Location: [facility];  Service: Endoscopy;  Laterality: N/A;  2:00   CORONARY ARTERY BYPASS GRAFT N/A 12/10/2016   Procedure: CORONARY ARTERY BYPASS GRAFTING (CABG) times three using the left internal mammary artery and the right greater saphenous vein;  Surgeon: Dorise MARLA Fellers, MD;  Location: [facility] OR;  Service: Open Heart Surgery;  Laterality: N/A;   LEFT HEART CATH AND CORONARY ANGIOGRAPHY N/A 12/10/2016   Procedure: Left Heart Cath and Coronary Angiography;  Surgeon: Debby DELENA Sor, MD;  Location: [facility] INVASIVE CV LAB;  Service: Cardiovascular;  Laterality: N/A;   TEE WITHOUT CARDIOVERSION N/A 12/10/2016   Procedure: TRANSESOPHAGEAL ECHOCARDIOGRAM (TEE);  Surgeon: Dorise MARLA Fellers, MD;  Location: [facility] OR;  Service: Open Heart Surgery;  Laterality: N/A;   TUBAL LIGATION     Family History  Problem Relation Age of Onset   Heart disease Mother    Diabetes Father    Hyperlipidemia Sister    Hypertension Sister    Mental illness Brother    Breast cancer Neg Hx    Social History   Occupational History   Not on file  Tobacco Use   Smoking status: Former  Current packs/day: 0.00    Average packs/day: 1 pack/day for 30.0 years (30.0 ttl pk-yrs)    Types: Cigarettes    Start date: 12/10/1986    Quit date: 12/10/2016    Years since quitting: 7.9   Smokeless tobacco: Never  Vaping Use   Vaping status: Never Used  Substance and Sexual Activity   Alcohol use: Not Currently    Comment: rarely   Drug use: No   Sexual activity: Not Currently    Birth control/protection: Surgical   Tobacco Counseling Counseling given: Yes  SDOH Screenings   Food Insecurity: No Food Insecurity (11/26/2024)  Housing: Low Risk (11/26/2024)  Transportation Needs: No Transportation Needs (11/26/2024)  Utilities: Not At Risk  (11/26/2024)  Alcohol Screen: Low Risk (08/24/2024)  Depression (PHQ2-9): Low Risk (11/26/2024)  Financial Resource Strain: Medium Risk (08/24/2024)  Physical Activity: Sufficiently Active (11/26/2024)  Social Connections: Moderately Integrated (11/26/2024)  Stress: No Stress Concern Present (11/26/2024)  Tobacco Use: Medium Risk (11/26/2024)  Health Literacy: Adequate Health Literacy (11/26/2024)   See flowsheets for full screening details  Depression Screen PHQ 2 & 9 Depression Scale- Over the past 2 weeks, how often have you been bothered by any of the following problems? Little interest or pleasure in doing things: 0 Feeling down, depressed, or hopeless (PHQ Adolescent also includes...irritable): 1 PHQ-2 Total Score: 1 Trouble falling or staying asleep, or sleeping too much: 0 Feeling tired or having little energy: 0 Poor appetite or overeating (PHQ Adolescent also includes...weight loss): 1 Feeling bad about yourself - or that you are a failure or have let yourself or your family down: 0 Trouble concentrating on things, such as reading the newspaper or watching television (PHQ Adolescent also includes...like school work): 0 Moving or speaking so slowly that other people could have noticed. Or the opposite - being so fidgety or restless that you have been moving around a lot more than usual: 0 Thoughts that you would be better off dead, or of hurting yourself in some way: 0 PHQ-9 Total Score: 1 If you checked off any problems, how difficult have these problems made it for you to do your work, take care of things at home, or get along with other people?: Not difficult at all  Depression Treatment Depression Interventions/Treatment : EYV7-0 Score <4 Follow-up Not Indicated     Goals Addressed             This Visit's Progress    DIET - INCREASE WATER INTAKE   On track            Objective:    Today's Vitals   11/26/24 1142  BP: 134/70  Pulse: (!) 55  Weight: 184 lb (83.5  kg)  Height: 5' 3 (1.6 m)   Body mass index is 32.59 kg/m.  Hearing/Vision screen No results found. Immunizations and Health Maintenance Health Maintenance  Topic Date Due   Hepatitis C Screening  Never done   DTaP/Tdap/Td (3 - Td or Tdap) 11/04/2009   Lung Cancer Screening  Never done   OPHTHALMOLOGY EXAM  02/06/2024   FOOT EXAM  02/24/2024   COVID-19 Vaccine (2 - 2025-26 season) 07/05/2024   Influenza Vaccine  02/01/2025 (Originally 06/04/2024)   Mammogram  02/16/2025 (Originally 01/06/2024)   Pneumococcal Vaccine: 50+ Years (1 of 2 - PCV) 08/25/2025 (Originally 02/09/1984)   Hepatitis B Vaccines 19-59 Average Risk (1 of 3 - 19+ 3-dose series) 08/25/2025 (Originally 02/09/1984)   HEMOGLOBIN A1C  02/23/2025   Diabetic kidney evaluation - Urine  ACR  03/03/2025   Colonoscopy  05/25/2025   Diabetic kidney evaluation - eGFR measurement  08/25/2025   Medicare Annual Wellness (AWV)  11/26/2025   Cervical Cancer Screening (HPV/Pap Cotest)  07/26/2026   HPV VACCINES (No Doses Required) Completed   HIV Screening  Completed   Meningococcal B Vaccine  Aged Out   Zoster Vaccines- Shingrix   Discontinued        Assessment/Plan:  This is a routine wellness examination for [place].  Patient Care Team: Zollie Lowers, MD as PCP - General (Family Medicine) Alvan Dorn FALCON, MD as PCP - Cardiology (Cardiology) Ladora Ross Lacy Phebe, MD as Referring Physician [facility])  I have personally reviewed and noted the following in the patients chart:   Medical and social history Use of alcohol, tobacco or illicit drugs  Current medications and supplements including opioid prescriptions. Functional ability and status Nutritional status Physical activity Advanced directives List of other physicians Hospitalizations, surgeries, and ER visits in previous 12 months Vitals Screenings to include cognitive, depression, and falls Referrals and appointments  Orders Placed This Encounter  Procedures    Ambulatory referral to Pulmonology    Referral Priority:   Routine    Referral Type:   Consultation    Referral Reason:   Specialty Services Required    Requested Specialty:   Pulmonary Disease    Number of Visits Requested:   1   In addition, I have reviewed and discussed with patient certain preventive protocols, quality metrics, and best practice recommendations. A written personalized care plan for preventive services as well as general preventive health recommendations were provided to patient.   Ozie Ned, CMA   11/26/2024   Return in 1 year (on 11/26/2025).  After Visit Summary: (MyChart) Due to this being a telephonic visit, the after visit summary with patients personalized plan was offered to patient via MyChart   Nurse Notes: Pt is aware and due the following HM: Dtap/foot exam/hep C screening-will get at next ov w/pcp, pt will make eye apt., pt declined Covid vaccine, Lung cancer screening--already ordered.  "

## 2024-11-26 NOTE — Patient Instructions (Signed)
 Cindy Garrett,  Thank you for taking the time for your Medicare Wellness Visit. I appreciate your continued commitment to your health goals. Please review the care plan we discussed, and feel free to reach out if I can assist you further.  Please note that Annual Wellness Visits do not include a physical exam. Some assessments may be limited, especially if the visit was conducted virtually. If needed, we may recommend an in-person follow-up with your provider.  Ongoing Care Seeing your primary care provider every 3 to 6 months helps us  monitor your health and provide consistent, personalized care.   Referrals If a referral was made during today's visit and you haven't received any updates within two weeks, please contact the referred provider directly to check on the status.  Recommended Screenings:  Health Maintenance  Topic Date Due   Hepatitis C Screening  Never done   DTaP/Tdap/Td vaccine (3 - Td or Tdap) 11/04/2009   Screening for Lung Cancer  Never done   Eye exam for diabetics  02/06/2024   Complete foot exam   02/24/2024   COVID-19 Vaccine (2 - 2025-26 season) 07/05/2024   Medicare Annual Wellness Visit  11/19/2024   Flu Shot  02/01/2025*   Breast Cancer Screening  02/16/2025*   Pneumococcal Vaccine for age over 5 (1 of 2 - PCV) 08/25/2025*   Hepatitis B Vaccine (1 of 3 - 19+ 3-dose series) 08/25/2025*   Hemoglobin A1C  02/23/2025   Kidney health urinalysis for diabetes  03/03/2025   Colon Cancer Screening  05/25/2025   Yearly kidney function blood test for diabetes  08/25/2025   Pap with HPV screening  07/26/2026   HPV Vaccine (No Doses Required) Completed   HIV Screening  Completed   Meningitis B Vaccine  Aged Out   Zoster (Shingles) Vaccine  Discontinued  *Topic was postponed. The date shown is not the original due date.       11/26/2024   11:16 AM  Advanced Directives  Does Patient Have a Medical Advance Directive? No  Would patient like information on creating a  medical advance directive? No - Patient declined    Vision: Annual vision screenings are recommended for early detection of glaucoma, cataracts, and diabetic retinopathy. These exams can also reveal signs of chronic conditions such as diabetes and high blood pressure.  Dental: Annual dental screenings help detect early signs of oral cancer, gum disease, and other conditions linked to overall health, including heart disease and diabetes.  Please see the attached documents for additional preventive care recommendations.

## 2024-11-29 ENCOUNTER — Ambulatory Visit: Payer: Self-pay | Admitting: Family Medicine

## 2024-11-30 ENCOUNTER — Other Ambulatory Visit: Payer: Self-pay | Admitting: Family Medicine

## 2024-11-30 DIAGNOSIS — E1165 Type 2 diabetes mellitus with hyperglycemia: Secondary | ICD-10-CM

## 2024-12-15 ENCOUNTER — Ambulatory Visit: Admitting: Family Medicine
